# Patient Record
Sex: Female | Born: 1985 | Race: White | Hispanic: No | Marital: Married | Smoking: Current every day smoker
Health system: Southern US, Community
[De-identification: ages and names within clinical notes are randomized; demographics above are authoritative.]

## PROBLEM LIST (undated history)

## (undated) DIAGNOSIS — F419 Anxiety disorder, unspecified: Secondary | ICD-10-CM

## (undated) DIAGNOSIS — F319 Bipolar disorder, unspecified: Secondary | ICD-10-CM

## (undated) HISTORY — PX: TUBAL LIGATION: SHX77

## (undated) HISTORY — PX: APPENDECTOMY: SHX54

## (undated) HISTORY — PX: OTHER SURGICAL HISTORY: SHX169

---
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
## 2006-07-23 ENCOUNTER — Emergency Department: Payer: Self-pay | Admitting: Emergency Medicine

## 2006-12-31 ENCOUNTER — Emergency Department: Payer: Self-pay

## 2007-08-14 ENCOUNTER — Observation Stay: Payer: Self-pay | Admitting: Obstetrics and Gynecology

## 2007-08-19 ENCOUNTER — Inpatient Hospital Stay: Payer: Self-pay | Admitting: Unknown Physician Specialty

## 2009-03-07 ENCOUNTER — Observation Stay: Payer: Self-pay | Admitting: Unknown Physician Specialty

## 2009-03-08 ENCOUNTER — Inpatient Hospital Stay: Payer: Self-pay | Admitting: Obstetrics and Gynecology

## 2010-09-23 ENCOUNTER — Emergency Department: Payer: Self-pay | Admitting: Emergency Medicine

## 2010-09-28 ENCOUNTER — Emergency Department: Payer: Self-pay | Admitting: Emergency Medicine

## 2013-03-12 ENCOUNTER — Observation Stay: Payer: Self-pay

## 2013-03-19 ENCOUNTER — Inpatient Hospital Stay: Payer: Self-pay | Admitting: Obstetrics & Gynecology

## 2013-03-19 LAB — CBC WITH DIFFERENTIAL/PLATELET
Eosinophil #: 0.1 10*3/uL (ref 0.0–0.7)
Eosinophil %: 0.5 %
Lymphocyte #: 2.2 10*3/uL (ref 1.0–3.6)
MCH: 31.5 pg (ref 26.0–34.0)
MCHC: 35.1 g/dL (ref 32.0–36.0)
MCV: 90 fL (ref 80–100)
Monocyte #: 1.7 x10 3/mm — ABNORMAL HIGH (ref 0.2–0.9)
Monocyte %: 7.2 %
Neutrophil %: 82.4 %
WBC: 23.4 10*3/uL — ABNORMAL HIGH (ref 3.6–11.0)

## 2013-03-20 LAB — HEMATOCRIT: HCT: 32.3 % — ABNORMAL LOW (ref 35.0–47.0)

## 2013-10-27 ENCOUNTER — Emergency Department: Payer: Self-pay | Admitting: Emergency Medicine

## 2014-09-10 ENCOUNTER — Emergency Department: Payer: Self-pay | Admitting: Emergency Medicine

## 2014-09-11 LAB — CBC WITH DIFFERENTIAL/PLATELET
BASOS PCT: 0.3 %
Basophil #: 0.1 10*3/uL (ref 0.0–0.1)
Eosinophil #: 0 10*3/uL (ref 0.0–0.7)
Eosinophil %: 0 %
HCT: 47.1 % — ABNORMAL HIGH (ref 35.0–47.0)
HGB: 15.6 g/dL (ref 12.0–16.0)
Lymphocyte #: 1.2 10*3/uL (ref 1.0–3.6)
Lymphocyte %: 5.4 %
MCH: 29.3 pg (ref 26.0–34.0)
MCHC: 33.1 g/dL (ref 32.0–36.0)
MCV: 89 fL (ref 80–100)
Monocyte #: 0.6 x10 3/mm (ref 0.2–0.9)
Monocyte %: 2.9 %
Neutrophil #: 20.3 10*3/uL — ABNORMAL HIGH (ref 1.4–6.5)
Neutrophil %: 91.4 %
Platelet: 389 10*3/uL (ref 150–440)
RBC: 5.3 10*6/uL — ABNORMAL HIGH (ref 3.80–5.20)
RDW: 12.7 % (ref 11.5–14.5)
WBC: 22.2 10*3/uL — AB (ref 3.6–11.0)

## 2014-09-11 LAB — COMPREHENSIVE METABOLIC PANEL
ALBUMIN: 4.2 g/dL (ref 3.4–5.0)
ALK PHOS: 171 U/L — AB
ALT: 104 U/L — AB
Anion Gap: 13 (ref 7–16)
BUN: 12 mg/dL (ref 7–18)
Bilirubin,Total: 1.2 mg/dL — ABNORMAL HIGH (ref 0.2–1.0)
CO2: 21 mmol/L (ref 21–32)
Calcium, Total: 9.4 mg/dL (ref 8.5–10.1)
Chloride: 106 mmol/L (ref 98–107)
Creatinine: 0.83 mg/dL (ref 0.60–1.30)
EGFR (African American): >60
GLUCOSE: 167 mg/dL — AB (ref 65–99)
OSMOLALITY: 283 (ref 275–301)
Potassium: 3.9 mmol/L (ref 3.5–5.1)
SGOT(AST): 46 U/L — ABNORMAL HIGH (ref 15–37)
Sodium: 140 mmol/L (ref 136–145)
Total Protein: 8.4 g/dL — ABNORMAL HIGH (ref 6.4–8.2)

## 2014-09-11 LAB — URINALYSIS, COMPLETE
BILIRUBIN, UR: NEGATIVE
BLOOD: NEGATIVE
Glucose,UR: NEGATIVE mg/dL (ref 0–75)
LEUKOCYTE ESTERASE: NEGATIVE
Nitrite: NEGATIVE
PH: 7 (ref 4.5–8.0)
Protein: 100
RBC,UR: 1 /HPF (ref 0–5)
Specific Gravity: 1.019 (ref 1.003–1.030)
WBC UR: 1 /HPF (ref 0–5)

## 2014-09-11 LAB — LIPASE, BLOOD: Lipase: 73 U/L (ref 73–393)

## 2014-09-12 ENCOUNTER — Inpatient Hospital Stay: Payer: Self-pay | Admitting: Internal Medicine

## 2014-09-12 LAB — CLOSTRIDIUM DIFFICILE(ARMC)

## 2014-09-13 LAB — CBC WITH DIFFERENTIAL/PLATELET
BASOS PCT: 0.3 %
Basophil #: 0 10*3/uL (ref 0.0–0.1)
EOS PCT: 0.6 %
Eosinophil #: 0.1 10*3/uL (ref 0.0–0.7)
HCT: 37.2 % (ref 35.0–47.0)
HGB: 12.2 g/dL (ref 12.0–16.0)
LYMPHS ABS: 2.4 10*3/uL (ref 1.0–3.6)
LYMPHS PCT: 19.5 %
MCH: 29.9 pg (ref 26.0–34.0)
MCHC: 32.7 g/dL (ref 32.0–36.0)
MCV: 91 fL (ref 80–100)
MONO ABS: 1.2 x10 3/mm — AB (ref 0.2–0.9)
MONOS PCT: 9.5 %
NEUTROS PCT: 70.1 %
Neutrophil #: 8.6 10*3/uL — ABNORMAL HIGH (ref 1.4–6.5)
PLATELETS: 266 10*3/uL (ref 150–440)
RBC: 4.07 10*6/uL (ref 3.80–5.20)
RDW: 13 % (ref 11.5–14.5)
WBC: 12.2 10*3/uL — ABNORMAL HIGH (ref 3.6–11.0)

## 2014-09-13 LAB — COMPREHENSIVE METABOLIC PANEL
Albumin: 3.1 g/dL — ABNORMAL LOW (ref 3.4–5.0)
Alkaline Phosphatase: 112 U/L
Anion Gap: 7 (ref 7–16)
BILIRUBIN TOTAL: 0.5 mg/dL (ref 0.2–1.0)
BUN: 9 mg/dL (ref 7–18)
CHLORIDE: 111 mmol/L — AB (ref 98–107)
CREATININE: 0.88 mg/dL (ref 0.60–1.30)
Calcium, Total: 8.2 mg/dL — ABNORMAL LOW (ref 8.5–10.1)
Co2: 25 mmol/L (ref 21–32)
EGFR (African American): >60
EGFR (Non-African Amer.): >60
Glucose: 139 mg/dL — ABNORMAL HIGH (ref 65–99)
Osmolality: 286 (ref 275–301)
Potassium: 4 mmol/L (ref 3.5–5.1)
SGOT(AST): 27 U/L (ref 15–37)
SGPT (ALT): 54 U/L
Sodium: 143 mmol/L (ref 136–145)
Total Protein: 5.9 g/dL — ABNORMAL LOW (ref 6.4–8.2)

## 2014-09-13 LAB — WBCS, STOOL

## 2014-09-16 LAB — STOOL CULTURE

## 2014-09-17 LAB — CULTURE, BLOOD (SINGLE)

## 2015-04-03 NOTE — Discharge Summary (Signed)
PATIENT NAME:  Tricia Kerr, Tricia Kerr MR#:  [id number] DATE OF BIRTH:  09-21-1986  DATE OF ADMISSION:  09/12/2014 DATE OF DISCHARGE:  09/13/2014  PRIMARY CARE PHYSICIAN: Nonlocal.   FINAL DIAGNOSES:  1.  Enteritis, nausea, vomiting, persistent abdominal pain.  2.  Leukocytosis and elevated liver function tests.  3.  Nicotine dependence.   MEDICATIONS ON DISCHARGE: Include Cipro 500 mg every 12 hours for 9 days, metronidazole 250 mg every 8 hours for 9 days.   DIET: Regular diet, stay bland with the diet for the next few days, regular consistency.   ACTIVITY: As tolerated.  FOLLOWUP: One to 2 weeks follow up with your medical doctor.   HOSPITAL COURSE: The patient was admitted 09/12/2014 and discharged 09/13/2014. Came in with abdominal pain, nausea, vomiting, unable to keep anything down. CT scan showed an enteritis. The patient was started on IV fluid hydration and empiric antibiotics.   LABORATORY AND RADIOLOGICAL DATA DURING THE HOSPITAL COURSE: Included a lipase of 73. Glucose 167, BUN 12, creatinine 0.83, sodium 140, potassium 3.9, chloride 106, CO2 of 21, calcium 9.4. Liver function tests: Total bilirubin 1.2, alkaline phosphatase 171, ALT 104, AST 46, total protein 8.4, albumin 4.2. White blood cell count 22.2, hemoglobin 15.6, hematocrit 47.1, platelet count of 387,000. Urinalysis 2+ bilirubin. Ultrasound of the abdomen, no acute abnormalities. CT scan of the abdomen and pelvis showed suggestion thickening of the wall of the ileum, mild mesenteric edema, likely enteritis, patchy ground glass opacities in the lung bases measuring up to 2.2 cm, likely inflammatory. Stool for Clostridium difficile was negative. Blood cultures negative. Stool culture holding for possible pathogen. No pathogenic Escherichia coli, no campylobacter, white blood cells in the stools negative.   White blood cell count upon discharge 12.2, hemoglobin 12.2, hematocrit 37.6, platelet count of 276,000. Creatinine 0.88.  Liver function tests had normalized.   HOSPITAL COURSE PER PROBLEM LIST:  1.  For the enteritis, nausea, vomiting persistent abdominal pain, this had improved. The patient was able to tolerate diet. The patient was given IV fluids during the hospital course and empiric antibiotics. Empiric antibiotics were given upon discharge. Can followup with outpatient for any further issues with her medical doctor.  2.  Leukocytosis and elevated liver function tests, likely secondary from the nausea, vomiting and being sick. This had improved.  3.  Nicotine dependence. I TRIED GIVING A NICOTINE PATCH. THE PATIENT HAD A REACTION WITH THAT, DEVELOPED SHORTNESS OF BREATH AND VERY SHAKINESS AND NAUSEOUS. Felt better after the nicotine patch was removed. She will go cold [place] on stopping this.  TIME SPENT ON DISCHARGE: Thirty-five minutes.    ____________________________ Herschell Dimesichard J. Renae GlossWieting, MD rjw:TT D: 09/14/2014 14:31:09 ET T: 09/14/2014 16:30:37 ET JOB#: [id number]  cc: Herschell Dimesichard J. Renae GlossWieting, MD, <Dictator> Salley ScarletICHARD J Ahren Pettinger MD ELECTRONICALLY SIGNED 09/27/2014 12:47

## 2015-04-03 NOTE — H&P (Signed)
PATIENT NAME:  KALIKA, SMAY MR#:  [id number] DATE OF BIRTH:  1986/04/26  DATE OF ADMISSION:  09/12/2014  REFERRING PHYSICIAN:  Rebecka Apley, MD     ADMITTING PHYSICIAN:  Crissie Figures, MD    PRIMARY CARE PHYSICIAN:  Nonlocal.    CHIEF COMPLAINT:  Nausea, vomiting and upper abdominal pain.    HISTORY OF PRESENT ILLNESS:  A 29 year old pleasant Caucasian woman with no significant past medical history presents to the emergency room with complaints of nausea, vomiting and upper abdominal pain following taking Zithromax tablets on an empty stomach around 1 p.m. yesterday.  The patient states that she has some URI symptoms for which she came to the Emergency Room on October 2, was evaluated by the provider and was diagnosed to have some sinus infection and was sent home on oral Zithromax tablets.  The patient took Zithromax tablets around 1 p.m. yesterday on an empty stomach following which she immediately had an episode of nausea, vomiting associated with upper abdominal pain, hence came to the Emergency Room for further evaluation.  Denies any fever.  Denies any blood in the vomitus.  Denies any rectal bleeding or melena.  She continued to have vomiting multiple times, even the emesis episodes lasted while she was in the Emergency Room and she was given some antiemetic medications following which the nausea and vomiting subsided.  Currently she does have some mild nausea but otherwise no further vomiting and abdominal pain is under reasonable control with pain medications.  She does have some mild cough for the past 3 days.  Otherwise, denies any chest pain, shortness of breath, dizziness, general weakness.  No dysuria or hematuria.    In the Emergency Room patient was evaluated by the ED physician and her lab work came as elevated white blood cell count of 22,000 with significant neutrophil elevations and a CT of the abdomen and pelvis revealed thickening of the wall of the ileum with mild  mesenteric edema representing enteritis, and she received IV antibiotics 1 dose, Cipro and Flagyl IV antibiotics, and hospitalist service consulted for further management.    PAST MEDICAL HISTORY:  Nil significant.    PAST SURGICAL HISTORY:  Status post appendectomy.    HOME MEDICATIONS:  None.    ALLERGIES:  No known drug allergies.     SOCIAL HISTORY:  She is married, has 3 children.  History of smoking, currently smokes half pack per day for the past 15+ years.  Occasional alcohol intake very socially.  Denies any IV drug abuse.    FAMILY HISTORY:  Lurline Del has a history of diabetes.  Otherwise, nil significant.    GYNECOLOGIC HISTORY:  Last menstrual period 1 week ago and uses condoms for her contraception.    REVIEW OF SYSTEMS:   CONSTITUTIONAL:  Denies any fever or fatigue, generalized weakness, no history of abnormal weight gain or weight loss.  EYES:  No blurred vision or double vision.  No redness.  EARS, NOSE THROAT:  Negative for tinnitus, ear pain, discharge. Negative for epistaxis. No oropharyngeal redness and no difficulty in swallowing.   RESPIRATORY:  Mild cough for the past 3 days but denies wheezing, no hemoptysis, no dyspnea, no painful respirations.  CARDIOVASCULAR:  Negative for chest pain, shortness of breath, dyspnea on exertion, orthopnea, palpitations.  No syncope.  No pedal edema.   GASTROINTESTINAL:  Positive for nausea, vomiting and abdominal pain following taking Zithromax tablets on an empty stomach.  She continued to have some vomiting episodes even  through the ER but now resolved.  Mild nausea at this time.  No diarrhea.  No blood in vomitus.  No melena.  No rectal bleeding.  No GERD symptoms.  GENITOURINARY:  Negative for dysuria, hematuria, frequency.  ENDOCRINE:  Negative for polyuria, polydipsia.  No heat or cold intolerance.   HEMATOLOGIC:  Negative for easy bruising or bleeding.   SKIN:  Negative for any rashes and lesions.  MUSCULOSKELETAL:  No  arthritis. No swelling.   NEUROLOGICAL:  Negative for focal weakness or numbness.  No migraines.  No history of CVA, TIA or seizures.    PSYCHIATRIC:  Negative for any anxiety, insomnia or depression.   PHYSICAL EXAMINATION:  VITAL SIGNS:  Temperature 97.9, pulse rate 76, respirations 18, blood pressure 112/69, O2 saturation 97%.  GENERAL:  Well built, well nourished, alert.  Not in any acute distress.  HEENT:  Head: Atraumatic, normocephalic.  Eyes:  Pupils equal and reacting to light and accommodation. No conjunctival pallor, no scleral icterus.  Extraocular movements intact. Nose:  No nasal lesions. No drainage.  Ears:  No drainage, no external lesions. Mouth:  No mucosal lesions.  No exudates.   NECK:  Supple. No JVD. No thyromegaly. No masses.  No cervical lymphadenopathy. Full range of motions without any pain. RESPIRATORY:  Good respiratory effort.  Clear to auscultation bilaterally.  CARDIOVASCULAR:  Regular rate and rhythm, no murmurs, gallops or clicks.  Carotids equal, femoral and pedal pulses equal and normal. No peripheral edema.  GASTROINTESTINAL:  Generalized mild tenderness in the upper abdomen. No guarding.  No rigidity.  No hepatosplenomegaly.  Bowel sounds equal in 4 areas.  Abdomen not distended. No rebound.  GENITOURINARY:  Deferred.  MUSCULOSKELETAL:  No tenderness of any joints.  Range of motion adequate.  SKIN:  Inspection within normal limits.  Well hydrated.  LYMPHATIC:  No cervical adenopathy.  VASCULAR:  Good dorsalis pedis and posterior tibial pulses.  NEUROLOGICAL:  Alert, awake and oriented x 3.  Cranial nerves II-XII intact.  DTRs 2+ bilaterally.  Motor strength 4/4 bilaterally upper and lower extremities.  PSYCHIATRIC:  Judgment and insight adequate. Alert and oriented x 3. Memory and mood within normal limits.   LABORATORY DATA:  Serum glucose 167, BUN 12, creatinine 0.83, sodium 140, potassium 3.9, chloride 106, bicarb 21, estimated GFR more than 60, total serum  calcium 9.4, lipase 73, total protein 8.4, albumin 4.2, total bilirubin 1.2, alkaline phosphatase 171, AST 46, ALT 104.  CBC:  WBC 22.2, hemoglobin 15.6, hematocrit 47.1, platelets 389, MCV 89, neutrophils 91.4%, neutrophil count 20.3.  Urinalysis:   Protein 100 mg/dL, nitrites and leukocyte esterase negative.  No RBC.  No WBC.    CT of the abdomen and pelvis with contrast:   1.  Thickening of the wall of the ileum with mild mesenteric edema likely representing enteritis.   2.  Patchy ground-glass opacities in the lung bases measuring 2.2 cm.  This is likely inflammatory.  Consider 3 month follow up to look for resolution.    Ultrasound of abdomen:  No gallstones or wall thickening.  No sonographic Murphy sign.  Common bile duct diameter normal.  No focal lesions.  Within normal limits.  No acute abnormalities demonstrated.    ASSESSMENT AND PLAN:  A 29 year old white female without any significant past medical history presents to the Emergency Room with complaints of nausea, vomiting and abdominal pain following taking Zithromax tablets on an empty stomach.  Workup and evaluation revealed elevated white blood cell count and  a CT of the abdomen revealed enteritis.   1.  Nausea, vomiting and abdominal pain secondary due to enteritis.  White count elevated at 22,000 and CT of the abdomen showing inflammation of the ileus.  Plan:  Blood cultures obtained.  Start on IV antibiotics, Cipro and metronidazole, antiemetics for nausea and vomiting control, pain control medications.  Follow up CBC, blood cultures.   2.  Elevated liver function tests.  Ultrasound done in the Emergency Room normal.  Etiology of elevated LFTs unclear at this time.  We will monitor LFTs closely.   3.  Tobacco usage, continuous.  Plan:  Counsel to quit.   4.  Code status.  Full status.    TIME SPENT:  55 minutes.    ____________________________ Crissie FiguresEdavally N. Meko Bellanger, MD enr:AT D: 09/12/2014 05:38:22 ET T: 09/12/2014 06:19:15  ET JOB#: [id number]  cc: Crissie FiguresEdavally N. Addalie Calles, MD, <Dictator> Crissie FiguresEDAVALLY N Librada Castronovo MD ELECTRONICALLY SIGNED 09/12/2014 10:37

## 2015-04-20 NOTE — H&P (Signed)
L&D Evaluation:  History:  HPI [age] year old G3 p2002 with EDC=03/15/2013 by LMP=06/08/2013 and c/b a 8wk 3 day ultrasoud presents at 39 5/7 weeks with c/o contractions and LOF. Seen in the office today and membranes were stripped. Contractions started after that and intensified at 1730 this evening. Also noted a sometime mucoid, sometimes watery discharge after exam. Baby active. Was 2.5 cm dilated in office today. [facility], a negative first trimester screen and a normal anatomy scan. Is O negative and received Rhogam 12/21/12. RI, VI, GBS negative. OB HX: SVD x 2. IOL in 2008 at 37 weeks for preeclampsia delivering a 5-15 female. SVD in 2010 at 40 weeks delivering a 6-6 female.   Presents with contractions, leaking fluid   Patient's Medical History Preeclampsia with G1 and G2   Patient's Surgical History Appendectomy   Medications Pre Natal Vitamins   Allergies NKDA   Social History tobacco  Former smoker   Family History Non-Contributory   ROS:  ROS All systems were reviewed.  HEENT, CNS, GI, GU, Respiratory, CV, Renal and Musculoskeletal systems were found to be normal.   Exam:  Vital Signs stable  134/85 T=98.6   Urine Protein not completed   General no apparent distress, appears comfortable, talking thru contractions.   Mental Status clear   Abdomen gravid, non-tender   Estimated Fetal Weight Average for gestational age   Fetal Position cephalic on ultrasound   Pelvic no external lesions, SSE: scant white discharge. fern and Nitrazine negative. cx: 2.5/80%/OOP   Mebranes Intact   FHT normal rate with no decels, 135-140 with accels to 170   FHT Description Cat 1   Fetal Heart Rate 140   Ucx q3-8 min apart   Skin dry   Impression:  Impression IUP at 39 5/7 weeks with false labor   Plan:  Plan DC home with labor precautions   Electronic Signatures: Trinna BalloonGutierrez, Naman Spychalski L (CNM)  (Signed 03-Apr-14 05:51)  Authored: L&D  Evaluation   Last Updated: 03-Apr-14 05:51 by Trinna BalloonGutierrez, Sevan Mcbroom L (CNM)

## 2015-04-20 NOTE — H&P (Signed)
L&D Evaluation:  History:  HPI 29 year old 163P2002 with EDC=03/15/2013 by LMP=06/08/2013 and c/b a 8wk 3 day ultrasoud presents at 566w3d with c/o contractions Baby active. Last check 2.5 cm < 1 week ago.   [facility], a negative first trimester screen and a normal anatomy scan. Is O negative and received Rhogam 12/21/12. RI, VI, GBS negative. OB HX: SVD x 2. IOL in 2008 at 37 weeks for preeclampsia delivering a 5-15 female. SVD in 2010 at 40 weeks delivering a 6-6 female.   Presents with contractions   Patient's Medical History Preeclampsia with G1 and G2   Patient's Surgical History Appendectomy   Medications Pre Natal Vitamins   Allergies NKDA   Social History tobacco  Former smoker   Family History Non-Contributory   ROS:  ROS All systems were reviewed.  HEENT, CNS, GI, GU, Respiratory, CV, Renal and Musculoskeletal systems were found to be normal.   Exam:  Vital Signs stable  normotensive   Urine Protein not completed   General no apparent distress, mild distress with contractions   Mental Status clear   Chest nl effort   Abdomen gravid, non-tender   Estimated Fetal Weight Average for gestational age   Pelvic 3-4cm / 90% per RN   Mebranes Intact   FHT normal rate with no decels, reactive NST   FHT Description Cat 1   Ucx every 3-5 min   Skin dry   Impression:  Impression G3P2 at 2266w3d in latent labor, IOL scheduled in 2 days.   Plan:  Plan EFM/NST, monitor contractions and for cervical change   Comments - Given pt discomfort and cervical change since last exam and IOL scheduled in very near future will admit for labor.   - analgesia as desired   Electronic Signatures: Garnette GunnerStansbury Clipp, Ali LoweEryn K (MD)  (Signed 09-Apr-14 07:45)  Authored: L&D Evaluation   Last Updated: 09-Apr-14 07:45 by Garnette GunnerStansbury Clipp, Ali LoweEryn K (MD)

## 2015-05-07 ENCOUNTER — Encounter: Payer: Self-pay | Admitting: Emergency Medicine

## 2015-05-07 ENCOUNTER — Ambulatory Visit
Admission: EM | Admit: 2015-05-07 | Discharge: 2015-05-07 | Disposition: A | Discharge status: Home / Self Care | Payer: Self-pay | Attending: Family Medicine | Admitting: Family Medicine

## 2015-05-07 DIAGNOSIS — R21 Rash and other nonspecific skin eruption: Secondary | ICD-10-CM

## 2015-05-07 NOTE — ED Notes (Signed)
Patient states that yesterday she fell off a deer stand and landed in a briar patch.  Patient has several red bumps all over her arms and legs.  Patient denies itching.  Patient reports some burning at the sites.

## 2015-05-07 NOTE — Discharge Instructions (Signed)
Rash A rash is a change in the color or texture of your skin. There are many different types of rashes. You may have other problems that accompany your rash. CAUSES   Infections.  Allergic reactions. This can include allergies to pets or foods.  Certain medicines.  Exposure to certain chemicals, soaps, or cosmetics.  Heat.  Exposure to poisonous plants.  Tumors, both cancerous and noncancerous. SYMPTOMS   Redness.  Scaly skin.  Itchy skin.  Dry or cracked skin.  Bumps.  Blisters.  Pain. DIAGNOSIS  Your caregiver may do a physical exam to determine what type of rash you have. A skin sample (biopsy) may be taken and examined under a microscope. TREATMENT  Treatment depends on the type of rash you have. Your caregiver may prescribe certain medicines. For serious conditions, you may need to see a skin doctor (dermatologist). HOME CARE INSTRUCTIONS   Avoid the substance that caused your rash.  Do not scratch your rash. This can cause infection.  You may take cool baths to help stop itching.  Only take over-the-counter or prescription medicines as directed by your caregiver.  Keep all follow-up appointments as directed by your caregiver. SEEK IMMEDIATE MEDICAL CARE IF:  You have increasing pain, swelling, or redness.  You have a fever.  You have new or severe symptoms.  You have body aches, diarrhea, or vomiting.  Your rash is not better after 3 days. MAKE SURE YOU:  Understand these instructions.  Will watch your condition.  Will get help right away if you are not doing well or get worse. Document Released: 11/17/2002 Document Revised: 02/19/2012 Document Reviewed: 09/11/2011 [facility] Patient Information 2015 [place], [place]. This information is not intended to replace advice given to you by your health care provider. Make sure you discuss any questions you have with your health care provider.  Tick Bite Information Ticks are insects that attach  themselves to the skin and draw blood for food. There are various types of ticks. Common types include wood ticks and deer ticks. Most ticks live in shrubs and grassy areas. Ticks can climb onto your body when you make contact with leaves or grass where the tick is waiting. The most common places on the body for ticks to attach themselves are the scalp, neck, armpits, waist, and groin. Most tick bites are harmless, but sometimes ticks carry germs that cause diseases. These germs can be spread to a person during the tick's feeding process. The chance of a disease spreading through a tick bite depends on:   The type of tick.  Time of year.   How long the tick is attached.   Geographic location.  HOW CAN YOU PREVENT TICK BITES? Take these steps to help prevent tick bites when you are outdoors:  Wear protective clothing. Long sleeves and long pants are best.   Wear white clothes so you can see ticks more easily.  Tuck your pant legs into your socks.   If walking on a trail, stay in the middle of the trail to avoid brushing against bushes.  Avoid walking through areas with long grass.  Put insect repellent on all exposed skin and along boot tops, pant legs, and sleeve cuffs.   Check clothing, hair, and skin repeatedly and before going inside.   Brush off any ticks that are not attached.  Take a shower or bath as soon as possible after being outdoors.  WHAT IS THE PROPER WAY TO REMOVE A TICK? Ticks should be removed as soon  as possible to help prevent diseases caused by tick bites.  If latex gloves are available, put them on before trying to remove a tick.   Using fine-point tweezers, grasp the tick as close to the skin as possible. You may also use curved forceps or a tick removal tool. Grasp the tick as close to its head as possible. Avoid grasping the tick on its body.  Pull gently with steady upward pressure until the tick lets go. Do not twist the tick or jerk it  suddenly. This may break off the tick's head or mouth parts.  Do not squeeze or crush the tick's body. This could force disease-carrying fluids from the tick into your body.   After the tick is removed, wash the bite area and your hands with soap and water or other disinfectant such as alcohol.  Apply a small amount of antiseptic cream or ointment to the bite site.   Wash and disinfect any instruments that were used.  Do not try to remove a tick by applying a hot match, petroleum jelly, or fingernail polish to the tick. These methods do not work and may increase the chances of disease being spread from the tick bite.  WHEN SHOULD YOU SEEK MEDICAL CARE? Contact your health care provider if you are unable to remove a tick from your skin or if a part of the tick breaks off and is stuck in the skin.  After a tick bite, you need to be aware of signs and symptoms that could be related to diseases spread by ticks. Contact your health care provider if you develop any of the following in the days or weeks after the tick bite:  Unexplained fever.  Rash. A circular rash that appears days or weeks after the tick bite may indicate the possibility of Lyme disease. The rash may resemble a target with a bull's-eye and may occur at a different part of your body than the tick bite.  Redness and swelling in the area of the tick bite.   Tender, swollen lymph glands.   Diarrhea.   Weight loss.   Cough.   Fatigue.   Muscle, joint, or bone pain.   Abdominal pain.   Headache.   Lethargy or a change in your level of consciousness.  Difficulty walking or moving your legs.   Numbness in the legs.   Paralysis.  Shortness of breath.   Confusion.   Repeated vomiting.  Document Released: 11/24/2000 Document Revised: 09/17/2013 Document Reviewed: 05/07/2013 [facility] Patient Information 2015 [place], [place]. This information is not intended to replace advice given to you by your  health care provider. Make sure you discuss any questions you have with your health care provider.  Insect Bite Mosquitoes, flies, fleas, bedbugs, and many other insects can bite. Insect bites are different from insect stings. A sting is when venom is injected into the skin. Some insect bites can transmit infectious diseases. SYMPTOMS  Insect bites usually turn red, swell, and itch for 2 to 4 days. They often go away on their own. TREATMENT  Your caregiver may prescribe antibiotic medicines if a bacterial infection develops in the bite. HOME CARE INSTRUCTIONS  Do not scratch the bite area.  Keep the bite area clean and dry. Wash the bite area thoroughly with soap and water.  Put ice or cool compresses on the bite area.  Put ice in a plastic bag.  Place a towel between your skin and the bag.  Leave the ice on for 20  minutes, 4 times a day for the first 2 to 3 days, or as directed.  You may apply a baking soda paste, cortisone cream, or calamine lotion to the bite area as directed by your caregiver. This can help reduce itching and swelling.  Only take over-the-counter or prescription medicines as directed by your caregiver.  If you are given antibiotics, take them as directed. Finish them even if you start to feel better. You may need a tetanus shot if:  You cannot remember when you had your last tetanus shot.  You have never had a tetanus shot.  The injury broke your skin. If you get a tetanus shot, your arm may swell, get red, and feel warm to the touch. This is common and not a problem. If you need a tetanus shot and you choose not to have one, there is a rare chance of getting tetanus. Sickness from tetanus can be serious. SEEK IMMEDIATE MEDICAL CARE IF:   You have increased pain, redness, or swelling in the bite area.  You see a red line on the skin coming from the bite.  You have a fever.  You have joint pain.  You have a headache or neck pain.  You have unusual  weakness.  You have a rash.  You have chest pain or shortness of breath.  You have abdominal pain, nausea, or vomiting.  You feel unusually tired or sleepy. MAKE SURE YOU:   Understand these instructions.  Will watch your condition.  Will get help right away if you are not doing well or get worse. Document Released: 01/04/2005 Document Revised: 02/19/2012 Document Reviewed: 06/28/2011 [facility] Patient Information 2015 [place], [place]. This information is not intended to replace advice given to you by your health care provider. Make sure you discuss any questions you have with your health care provider.

## 2015-05-07 NOTE — ED Provider Notes (Signed)
CSN: [id number]     Arrival date & time 05/07/15  1313 History   First MD Initiated Contact with Patient 05/07/15 1428     Chief Complaint  Patient presents with  . Fall  . red bumps    (Consider location/radiation/quality/duration/timing/severity/associated sxs/prior Treatment) HPI   29 year old female presents for evaluation of a rash. Yesterday she fell out of a deer stand into a briar patch. She has multiple scrapes and she woke up this morning with the rash. She has multiple areas on her body with a small scab and some surrounding induration. She thought that possibly she was bitten by multiple ticks but they have not seen any ticks on her body. They tried to remove any potential ticks from the scabbed over area but was unable to do so. The rash is asymptomatic. She denies any pain, itching, or drainage. No fever, chills, NVD, or any other systemic symptoms   History reviewed. No pertinent past medical history. Past Surgical History  Procedure Laterality Date  . Appendectomy     History reviewed. No pertinent family history. History  Substance Use Topics  . Smoking status: Current Every Day Smoker -- 1.00 packs/day    Types: Cigarettes  . Smokeless tobacco: Never Used  . Alcohol Use: Yes   OB History    No data available     Review of Systems  Skin: Positive for rash.  All other systems reviewed and are negative.   Allergies  Nicotine  Home Medications   Prior to Admission medications   Not on File   BP 130/81 mmHg  Pulse 71  Temp(Src) 97 F (36.1 C) (Tympanic)  Resp 16  Ht 4\' 11"  (1.499 m)  Wt 116 lb (52.617 kg)  BMI 23.42 kg/m2  SpO2 97%  LMP 04/29/2015 (Exact Date) Physical Exam  Constitutional: She is oriented to person, place, and time. Vital signs are normal. She appears well-developed and well-nourished. No distress.  HENT:  Head: Normocephalic and atraumatic.  Pulmonary/Chest: Effort normal. No respiratory distress.  Neurological: She is alert and  oriented to person, place, and time. She has normal strength. Coordination normal.  Skin: Skin is warm and dry. Rash (on exposed areas of the skin, there are multiple 2-3 mm scabbed areas with very minimal surrounding induration, nontender.) noted. She is not diaphoretic.  Multiple abrasions  Psychiatric: She has a normal mood and affect. Judgment normal.  Nursing note and vitals reviewed.   ED Course  Procedures (including critical care time) Labs Review Labs Reviewed - No data to display  Imaging Review No results found.   MDM   1. Rash    I do not see any ticks. I believe this is most likely scabs from puncture wounds from the briars. Either way, this is much too early to determine if she would develop any tickborne illness. She has no signs of any skin infection either. I discussed this with her, she elects watchful waiting and she will return if she has any worsening in her condition. We discussed signs and symptoms of tick born illness, she will follow-up immediately if she gets sick at all in the next month    Graylon GoodZachary H Merrianne Mccumbers, PA-C 05/07/15 1447

## 2015-06-23 ENCOUNTER — Encounter: Payer: Self-pay | Admitting: Emergency Medicine

## 2015-06-23 ENCOUNTER — Emergency Department
Admission: EM | Admit: 2015-06-23 | Discharge: 2015-06-23 | Disposition: A | Discharge status: Home / Self Care | Payer: Medicaid Other | Attending: Emergency Medicine | Admitting: Emergency Medicine

## 2015-06-23 DIAGNOSIS — K529 Noninfective gastroenteritis and colitis, unspecified: Secondary | ICD-10-CM | POA: Insufficient documentation

## 2015-06-23 DIAGNOSIS — Z79899 Other long term (current) drug therapy: Secondary | ICD-10-CM | POA: Diagnosis not present

## 2015-06-23 DIAGNOSIS — E860 Dehydration: Secondary | ICD-10-CM | POA: Diagnosis not present

## 2015-06-23 DIAGNOSIS — R197 Diarrhea, unspecified: Secondary | ICD-10-CM | POA: Diagnosis present

## 2015-06-23 DIAGNOSIS — Z720 Tobacco use: Secondary | ICD-10-CM | POA: Diagnosis not present

## 2015-06-23 LAB — URINALYSIS COMPLETE WITH MICROSCOPIC (ARMC ONLY)
BACTERIA UA: NONE SEEN
Bilirubin Urine: NEGATIVE
Glucose, UA: NEGATIVE mg/dL
HGB URINE DIPSTICK: NEGATIVE
LEUKOCYTES UA: NEGATIVE
Nitrite: NEGATIVE
PROTEIN: 100 mg/dL — AB
SPECIFIC GRAVITY, URINE: 1.028 (ref 1.005–1.030)
pH: 6 (ref 5.0–8.0)

## 2015-06-23 LAB — COMPREHENSIVE METABOLIC PANEL
ALT: 22 U/L (ref 14–54)
AST: 28 U/L (ref 15–41)
Albumin: 5 g/dL (ref 3.5–5.0)
Alkaline Phosphatase: 81 U/L (ref 38–126)
Anion gap: 12 (ref 5–15)
BUN: 12 mg/dL (ref 6–20)
CO2: 21 mmol/L — ABNORMAL LOW (ref 22–32)
Calcium: 9.6 mg/dL (ref 8.9–10.3)
Chloride: 106 mmol/L (ref 101–111)
Creatinine, Ser: 0.82 mg/dL (ref 0.44–1.00)
GFR calc Af Amer: >60 mL/min (ref >60)
GFR calc non Af Amer: >60 mL/min (ref >60)
Glucose, Bld: 161 mg/dL — ABNORMAL HIGH (ref 65–99)
Potassium: 3.7 mmol/L (ref 3.5–5.1)
SODIUM: 139 mmol/L (ref 135–145)
Total Bilirubin: 1.5 mg/dL — ABNORMAL HIGH (ref 0.3–1.2)
Total Protein: 8.1 g/dL (ref 6.5–8.1)

## 2015-06-23 LAB — CBC
HCT: 46.8 % (ref 35.0–47.0)
HEMOGLOBIN: 16 g/dL (ref 12.0–16.0)
MCH: 30.3 pg (ref 26.0–34.0)
MCHC: 34.3 g/dL (ref 32.0–36.0)
MCV: 88.3 fL (ref 80.0–100.0)
PLATELETS: 335 10*3/uL (ref 150–440)
RBC: 5.3 MIL/uL — AB (ref 3.80–5.20)
RDW: 13 % (ref 11.5–14.5)
WBC: 18.8 10*3/uL — ABNORMAL HIGH (ref 3.6–11.0)

## 2015-06-23 LAB — LIPASE, BLOOD: Lipase: 49 U/L (ref 22–51)

## 2015-06-23 MED ORDER — MORPHINE SULFATE 4 MG/ML IJ SOLN
INTRAMUSCULAR | Status: AC
  Administered 2015-06-23: 4 mg via INTRAVENOUS
  Filled 2015-06-23: qty 1

## 2015-06-23 MED ORDER — ONDANSETRON 8 MG PO TBDP: 8.0000 mg | ORAL_TABLET | Freq: Three times a day (TID) | ORAL | Status: DC | PRN

## 2015-06-23 MED ORDER — DICYCLOMINE HCL 10 MG PO CAPS
ORAL_CAPSULE | ORAL | Status: AC
  Administered 2015-06-23: 20 mg via ORAL
  Filled 2015-06-23: qty 2

## 2015-06-23 MED ORDER — RANITIDINE HCL 150 MG PO CAPS: 150.0000 mg | ORAL_CAPSULE | Freq: Two times a day (BID) | ORAL | Status: DC

## 2015-06-23 MED ORDER — DEXTROSE 5 % AND 0.9 % NACL IV BOLUS
1000.0000 mL | Freq: Once | INTRAVENOUS | Status: AC
  Administered 2015-06-23: 1000 mL via INTRAVENOUS

## 2015-06-23 MED ORDER — MORPHINE SULFATE 4 MG/ML IJ SOLN
4.0000 mg | Freq: Once | INTRAMUSCULAR | Status: AC
  Administered 2015-06-23: 4 mg via INTRAVENOUS

## 2015-06-23 MED ORDER — DICYCLOMINE HCL 10 MG PO CAPS
20.0000 mg | ORAL_CAPSULE | Freq: Once | ORAL | Status: AC
  Administered 2015-06-23: 20 mg via ORAL

## 2015-06-23 MED ORDER — DICYCLOMINE HCL 20 MG PO TABS: 20.0000 mg | ORAL_TABLET | Freq: Three times a day (TID) | ORAL | Status: DC | PRN

## 2015-06-23 MED ORDER — ONDANSETRON HCL 4 MG/2ML IJ SOLN
INTRAMUSCULAR | Status: AC
  Administered 2015-06-23: 4 mg via INTRAVENOUS
  Filled 2015-06-23: qty 2

## 2015-06-23 MED ORDER — ONDANSETRON HCL 4 MG/2ML IJ SOLN
4.0000 mg | Freq: Once | INTRAMUSCULAR | Status: AC
  Administered 2015-06-23: 4 mg via INTRAVENOUS

## 2015-06-23 NOTE — ED Notes (Signed)
Pt presents with  N/v/d that started last night with epigastric pain.

## 2015-06-23 NOTE — Discharge Instructions (Signed)
Dehydration, Adult Dehydration is when you lose more fluids from the body than you take in. Vital organs like the kidneys, brain, and heart cannot function without a proper amount of fluids and salt. Any loss of fluids from the body can cause dehydration.  CAUSES   Vomiting.  Diarrhea.  Excessive sweating.  Excessive urine output.  Fever. SYMPTOMS  Mild dehydration  Thirst.  Dry lips.  Slightly dry mouth. Moderate dehydration  Very dry mouth.  Sunken eyes.  Skin does not bounce back quickly when lightly pinched and released.  Dark urine and decreased urine production.  Decreased tear production.  Headache. Severe dehydration  Very dry mouth.  Extreme thirst.  Rapid, weak pulse (more than 100 beats per minute at rest).  Cold hands and feet.  Not able to sweat in spite of heat and temperature.  Rapid breathing.  Blue lips.  Confusion and lethargy.  Difficulty being awakened.  Minimal urine production.  No tears. DIAGNOSIS  Your caregiver will diagnose dehydration based on your symptoms and your exam. Blood and urine tests will help confirm the diagnosis. The diagnostic evaluation should also identify the cause of dehydration. TREATMENT  Treatment of mild or moderate dehydration can often be done at home by increasing the amount of fluids that you drink. It is best to drink small amounts of fluid more often. Drinking too much at one time can make vomiting worse. Refer to the home care instructions below. Severe dehydration needs to be treated at the hospital where you will probably be given intravenous (IV) fluids that contain water and electrolytes. HOME CARE INSTRUCTIONS   Ask your caregiver about specific rehydration instructions.  Drink enough fluids to keep your urine clear or pale yellow.  Drink small amounts frequently if you have nausea and vomiting.  Eat as you normally do.  Avoid:  Foods or drinks high in sugar.  Carbonated  drinks.  Juice.  Extremely hot or cold fluids.  Drinks with caffeine.  Fatty, greasy foods.  Alcohol.  Tobacco.  Overeating.  Gelatin desserts.  Wash your hands well to avoid spreading bacteria and viruses.  Only take over-the-counter or prescription medicines for pain, discomfort, or fever as directed by your caregiver.  Ask your caregiver if you should continue all prescribed and over-the-counter medicines.  Keep all follow-up appointments with your caregiver. SEEK MEDICAL CARE IF:  You have abdominal pain and it increases or stays in one area (localizes).  You have a rash, stiff neck, or severe headache.  You are irritable, sleepy, or difficult to awaken.  You are weak, dizzy, or extremely thirsty. SEEK IMMEDIATE MEDICAL CARE IF:   You are unable to keep fluids down or you get worse despite treatment.  You have frequent episodes of vomiting or diarrhea.  You have blood or green matter (bile) in your vomit.  You have blood in your stool or your stool looks black and tarry.  You have not urinated in 6 to 8 hours, or you have only urinated a small amount of very dark urine.  You have a fever.  You faint. MAKE SURE YOU:   Understand these instructions.  Will watch your condition.  Will get help right away if you are not doing well or get worse. Document Released: 11/27/2005 Document Revised: 02/19/2012 Document Reviewed: 07/17/2011 Exit[employer] Patient Information 2015 [employer]. This information is not intended to replace advice given to you by your health care provider. Make sure you discuss any questions you have with your health care   provider.   Viral Gastroenteritis Viral gastroenteritis is also known as stomach flu. This condition affects the stomach and intestinal tract. It can cause sudden diarrhea and vomiting. The illness typically lasts 3 to 8 days. Most people develop an immune response that eventually gets rid of the virus. While this natural  response develops, the virus can make you quite ill. CAUSES  Many different viruses can cause gastroenteritis, such as rotavirus or noroviruses. You can catch one of these viruses by consuming contaminated food or water. You may also catch a virus by sharing utensils or other personal items with an infected person or by touching a contaminated surface. SYMPTOMS  The most common symptoms are diarrhea and vomiting. These problems can cause a severe loss of body fluids (dehydration) and a body salt (electrolyte) imbalance. Other symptoms may include:  Fever.  Headache.  Fatigue.  Abdominal pain. DIAGNOSIS  Your caregiver can usually diagnose viral gastroenteritis based on your symptoms and a physical exam. A stool sample may also be taken to test for the presence of viruses or other infections. TREATMENT  This illness typically goes away on its own. Treatments are aimed at rehydration. The most serious cases of viral gastroenteritis involve vomiting so severely that you are not able to keep fluids down. In these cases, fluids must be given through an intravenous line (IV). HOME CARE INSTRUCTIONS   Drink enough fluids to keep your urine clear or pale yellow. Drink small amounts of fluids frequently and increase the amounts as tolerated.  Ask your caregiver for specific rehydration instructions.  Avoid:  Foods high in sugar.  Alcohol.  Carbonated drinks.  Tobacco.  Juice.  Caffeine drinks.  Extremely hot or cold fluids.  Fatty, greasy foods.  Too much intake of anything at one time.  Dairy products until 24 to 48 hours after diarrhea stops.  You may consume probiotics. Probiotics are active cultures of beneficial bacteria. They may lessen the amount and number of diarrheal stools in adults. Probiotics can be found in yogurt with active cultures and in supplements.  Wash your hands well to avoid spreading the virus.  Only take over-the-counter or prescription medicines for  pain, discomfort, or fever as directed by your caregiver. Do not give aspirin to children. Antidiarrheal medicines are not recommended.  Ask your caregiver if you should continue to take your regular prescribed and over-the-counter medicines.  Keep all follow-up appointments as directed by your caregiver. SEEK IMMEDIATE MEDICAL CARE IF:   You are unable to keep fluids down.  You do not urinate at least once every 6 to 8 hours.  You develop shortness of breath.  You notice blood in your stool or vomit. This may look like coffee grounds.  You have abdominal pain that increases or is concentrated in one small area (localized).  You have persistent vomiting or diarrhea.  You have a fever.  The patient is a child younger than 3 months, and he or she has a fever.  The patient is a child older than 3 months, and he or she has a fever and persistent symptoms.  The patient is a child older than 3 months, and he or she has a fever and symptoms suddenly get worse.  The patient is a baby, and he or she has no tears when crying. MAKE SURE YOU:   Understand these instructions.  Will watch your condition.  Will get help right away if you are not doing well or get worse. Document Released: 11/27/2005 Document Revised:   02/19/2012 Document Reviewed: 09/13/2011 ExitCare Patient Information 2015 [employer]. This information is not intended to replace advice given to you by your health care provider. Make sure you discuss any questions you have with your health care provider.  

## 2015-06-23 NOTE — ED Provider Notes (Signed)
[facility] Emergency Department Provider Note  ____________________________________________  Time seen: 3:15 PM  I have reviewed the triage vital signs and the nursing notes.   HISTORY  Chief Complaint Emesis and Diarrhea    HPI Tricia Kerr is a 29 y.o. female who complains of gradual onset of generalized abdominal pain with nausea vomiting and diarrhea since last night. She's had persistent episodes of vomiting and diarrhea, which are provoked with eating. She does not have postprandial pain. No fevers or chills. No chest pain shortness of breath dizziness lightheadedness or syncope. No urinary symptoms. She notes that the symptoms started last night after eating some barbecue from a cookout. Abdominal pain is nonradiating, no aggravating or alleviating factors, intermittent and colicky.     History reviewed. No pertinent past medical history.  There are no active problems to display for this patient.   Past Surgical History  Procedure Laterality Date  . Appendectomy      Current Outpatient Rx  Name  Route  Sig  Dispense  Refill  . acetaminophen (TYLENOL) 500 MG tablet   Oral   Take 500 mg by mouth every 6 (six) hours as needed.         . dicyclomine (BENTYL) 20 MG tablet   Oral   Take 1 tablet (20 mg total) by mouth 3 (three) times daily as needed for spasms.   30 tablet   0   . ondansetron (ZOFRAN ODT) 8 MG disintegrating tablet   Oral   Take 1 tablet (8 mg total) by mouth every 8 (eight) hours as needed for nausea or vomiting.   20 tablet   0   . ranitidine (ZANTAC) 150 MG capsule   Oral   Take 1 capsule (150 mg total) by mouth 2 (two) times daily.   28 capsule   0     Allergies Nicotine  No family history on file.  Social History History  Substance Use Topics  . Smoking status: Current Every Day Smoker -- 1.00 packs/day    Types: Cigarettes  . Smokeless tobacco: Never Used  . Alcohol Use: Yes    Review of  Systems  Constitutional: No fever or chills. No weight changes Eyes:No blurry vision or double vision.  ENT: No sore throat. Cardiovascular: No chest pain. Respiratory: No dyspnea or cough. Gastrointestinal: Positive abdominal pain and vomiting and diarrhea as above.  No BRBPR or melena. Genitourinary: Negative for dysuria, urinary retention, bloody urine, or difficulty urinating. Musculoskeletal: Negative for back pain. No joint swelling or pain. Skin: Negative for rash. Neurological: Negative for headaches, focal weakness or numbness. Psychiatric:No anxiety or depression.   Endocrine:No hot/cold intolerance, changes in energy, or sleep difficulty.  10-point ROS otherwise negative.  ____________________________________________   PHYSICAL EXAM:  VITAL SIGNS: ED Triage Vitals  Enc Vitals Group     BP 06/23/15 1141 137/85 mmHg     Pulse Rate 06/23/15 1139 50     Resp 06/23/15 1139 18     Temp 06/23/15 1344 98 F (36.7 C)     Temp Source 06/23/15 1344 Oral     SpO2 06/23/15 1139 100 %     Weight 06/23/15 1139 115 lb (52.164 kg)     Height 06/23/15 1139 4\' 11"  (1.499 m)     Head Cir --      Peak Flow --      Pain Score 06/23/15 1140 9     Pain Loc --      Pain Edu? --  Excl. in GC? --      Constitutional: Alert and oriented. Well appearing and in no distress. Eyes: No scleral icterus. No conjunctival pallor. PERRL. EOMI ENT   Head: Normocephalic and atraumatic.   Nose: No congestion/rhinnorhea. No septal hematoma   Mouth/Throat: MMM, no pharyngeal erythema. No peritonsillar mass. No uvula shift.   Neck: No stridor. No SubQ emphysema. No meningismus. Hematological/Lymphatic/Immunilogical: No cervical lymphadenopathy. Cardiovascular: RRR. Normal and symmetric distal pulses are present in all extremities. No murmurs, rubs, or gallops. Respiratory: Normal respiratory effort without tachypnea nor retractions. Breath sounds are clear and equal bilaterally.  No wheezes/rales/rhonchi. Gastrointestinal: Soft and nontender. No distention. There is no CVA tenderness.  No rebound, rigidity, or guarding. Genitourinary: deferred Musculoskeletal: Nontender with normal range of motion in all extremities. No joint effusions.  No lower extremity tenderness.  No edema. Neurologic:   Normal speech and language.  CN 2-10 normal. Motor grossly intact. No pronator drift.  Normal gait. No gross focal neurologic deficits are appreciated.  Skin:  Skin is warm, dry and intact. No rash noted.  No petechiae, purpura, or bullae. Psychiatric: Mood and affect are normal. Speech and behavior are normal. Patient exhibits appropriate insight and judgment.  ____________________________________________    LABS (pertinent positives/negatives) (all labs ordered are listed, but only abnormal results are displayed) Labs Reviewed  COMPREHENSIVE METABOLIC PANEL - Abnormal; Notable for the following:    CO2 21 (*)    Glucose, Bld 161 (*)    Total Bilirubin 1.5 (*)    All other components within normal limits  CBC - Abnormal; Notable for the following:    WBC 18.8 (*)    RBC 5.30 (*)    All other components within normal limits  URINALYSIS COMPLETEWITH MICROSCOPIC ([facility] ONLY) - Abnormal; Notable for the following:    Color, Urine AMBER (*)    APPearance CLEAR (*)    Ketones, ur 2+ (*)    Protein, ur 100 (*)    Squamous Epithelial / LPF 0-5 (*)    All other components within normal limits  LIPASE, BLOOD  POC URINE PREG, ED   ____________________________________________   EKG    ____________________________________________    RADIOLOGY    ____________________________________________   PROCEDURES  ____________________________________________   INITIAL IMPRESSION / ASSESSMENT AND PLAN / ED COURSE  Pertinent labs & imaging results that were available during my care of the patient were reviewed by me and considered in my medical decision making (see  chart for details).  Patient presents with generalized abdominal pain nausea vomiting and diarrhea, with a reassuring abdominal exam. She is very well-appearing nontoxic no acute distress, calm and comfortable and smiling. She is energetic. Labs reveal a leukocytosis which is consistent with gastroenteritis, as well as ketosis on the urinalysis due to her inability to tolerate oral intake. We will give her IV D5 NS and antiemetics and then try to see if she can tolerate oral intake. ----------------------------------------- 4:55 PM on 06/23/2015 -----------------------------------------  Patient tolerating oral intake. Workup is otherwise unremarkable. She still has some abdominal discomfort although it remains nonfocal. Low suspicion for biliary disease or perforation or GU complications. We'll discharge home with antiemetics and antispasmodics. ____________________________________________   FINAL CLINICAL IMPRESSION(S) / ED DIAGNOSES  Final diagnoses:  Gastroenteritis, acute  Dehydration, mild      Sharman Cheek, MD 06/23/15 1656

## 2015-06-23 NOTE — ED Notes (Signed)
Pt able to eat package of graham crackers and drink cola. Continues to have pain. IV fluids still running in.

## 2015-06-23 NOTE — ED Notes (Signed)
C/o N/V/D since last night. Has vomited x3 since coming to ED, has had 3-4 loose stools since this morning.  Unable to keep anything down at this time including water.  Also reports when vomiting she gets sweaty and then once she vomits she gets cold chills.  Afebrile at this time.

## 2015-08-21 ENCOUNTER — Encounter: Payer: Self-pay | Admitting: Emergency Medicine

## 2015-08-21 ENCOUNTER — Emergency Department
Admission: EM | Admit: 2015-08-21 | Discharge: 2015-08-23 | Disposition: A | Discharge status: Other Acute Inpatient Hospital | Payer: Medicaid Other | Attending: Student | Admitting: Student

## 2015-08-21 DIAGNOSIS — F29 Unspecified psychosis not due to a substance or known physiological condition: Secondary | ICD-10-CM | POA: Diagnosis not present

## 2015-08-21 DIAGNOSIS — F1210 Cannabis abuse, uncomplicated: Secondary | ICD-10-CM | POA: Insufficient documentation

## 2015-08-21 DIAGNOSIS — Z79899 Other long term (current) drug therapy: Secondary | ICD-10-CM | POA: Insufficient documentation

## 2015-08-21 DIAGNOSIS — Z3202 Encounter for pregnancy test, result negative: Secondary | ICD-10-CM | POA: Diagnosis not present

## 2015-08-21 DIAGNOSIS — Z720 Tobacco use: Secondary | ICD-10-CM | POA: Diagnosis not present

## 2015-08-21 DIAGNOSIS — Z046 Encounter for general psychiatric examination, requested by authority: Secondary | ICD-10-CM | POA: Diagnosis present

## 2015-08-21 HISTORY — DX: Anxiety disorder, unspecified: F41.9

## 2015-08-21 LAB — URINE DRUG SCREEN, QUALITATIVE (ARMC ONLY)
Amphetamines, Ur Screen: NOT DETECTED
BENZODIAZEPINE, UR SCRN: NOT DETECTED
Barbiturates, Ur Screen: NOT DETECTED
CANNABINOID 50 NG, UR ~~LOC~~: POSITIVE — AB
Cocaine Metabolite,Ur ~~LOC~~: NOT DETECTED
MDMA (Ecstasy)Ur Screen: NOT DETECTED
METHADONE SCREEN, URINE: NOT DETECTED
Opiate, Ur Screen: NOT DETECTED
Phencyclidine (PCP) Ur S: NOT DETECTED
TRICYCLIC, UR SCREEN: NOT DETECTED

## 2015-08-21 LAB — CBC
HCT: 46.1 % (ref 35.0–47.0)
HEMOGLOBIN: 15.3 g/dL (ref 12.0–16.0)
MCH: 29.9 pg (ref 26.0–34.0)
MCHC: 33.1 g/dL (ref 32.0–36.0)
MCV: 90.3 fL (ref 80.0–100.0)
PLATELETS: 323 10*3/uL (ref 150–440)
RBC: 5.11 MIL/uL (ref 3.80–5.20)
RDW: 13.8 % (ref 11.5–14.5)
WBC: 12.1 10*3/uL — ABNORMAL HIGH (ref 3.6–11.0)

## 2015-08-21 LAB — COMPREHENSIVE METABOLIC PANEL
ALK PHOS: 75 U/L (ref 38–126)
ALT: 22 U/L (ref 14–54)
ANION GAP: 9 (ref 5–15)
AST: 28 U/L (ref 15–41)
Albumin: 4.7 g/dL (ref 3.5–5.0)
BUN: 14 mg/dL (ref 6–20)
CALCIUM: 9.2 mg/dL (ref 8.9–10.3)
CO2: 24 mmol/L (ref 22–32)
Chloride: 106 mmol/L (ref 101–111)
Creatinine, Ser: 0.75 mg/dL (ref 0.44–1.00)
Glucose, Bld: 103 mg/dL — ABNORMAL HIGH (ref 65–99)
Potassium: 3.8 mmol/L (ref 3.5–5.1)
Sodium: 139 mmol/L (ref 135–145)
Total Bilirubin: 1.4 mg/dL — ABNORMAL HIGH (ref 0.3–1.2)
Total Protein: 7.8 g/dL (ref 6.5–8.1)

## 2015-08-21 LAB — ETHANOL

## 2015-08-21 LAB — ACETAMINOPHEN LEVEL: Acetaminophen (Tylenol), Serum: <10 ug/mL — ABNORMAL LOW (ref 10–30)

## 2015-08-21 LAB — PREGNANCY, URINE: PREG TEST UR: NEGATIVE

## 2015-08-21 LAB — SALICYLATE LEVEL

## 2015-08-21 NOTE — ED Notes (Addendum)
BEHAVIORAL HEALTH ROUNDING Patient sleeping: No   Patient alert and oriented: Yes  Behavior appropriate: no If no, describe: Giggling, restless.  Talking about an internal fish tube that runs from her stomach all throughout her body.   Nutrition and fluids offered: Yes   Toileting and hygiene offered: Yes  Sitter present: Yes   Law enforcement present: Yes

## 2015-08-21 NOTE — ED Notes (Signed)
Pt transferred into ED BHU room 5 after receiving report from [facility]    Patient assigned to appropriate care area. Patient oriented to unit/care area: Informed that, for their safety, care areas are designed for safety and monitored by security cameras at all times; Visiting hours and phone times explained to patient. Patient verbalizes understanding, and verbal contract for safety obtained.    I introduced myself to her and discussed the plan of care  Pt verbalized understanding

## 2015-08-21 NOTE — ED Notes (Signed)
Patient AAOx3.  Skin warm and dry.  Answering some questions.  Avoids eye contact and turns head away.  Patient states that she is not feeling right mentally.  States she has not been feeling right since before she went to the beach.  Unable to say when she went to the beach.  States she takes Xanax and Percocet for medications, but states she has not been taking medications currently.  Unable to say when she last took medication.

## 2015-08-21 NOTE — ED Notes (Addendum)
BEHAVIORAL HEALTH ROUNDING Patient sleeping: Yes   Patient alert and oriented: Yes   Behavior appropriate: Yes   Nutrition and fluids offered: Yes  Toileting and hygiene offered: Yes   Sitter present: Yes  Law enforcement present:  Yes 

## 2015-08-21 NOTE — BH Assessment (Signed)
Assessment Note  Tricia Kerr is an 29 y.o. female who presents to [facility] ED after her boyfriend/ husband, [place] Snee, petitioned for Pt's involuntary commitment. Pt reports she doesn't know why she was here, "maybe the cooper fish tube that I got from before I went to the beach." Pt states she is wearing a sparkly shirt and a black skirt. Pt states she has three children, all of which live with their father Nida Boatman). Pt reports drinking approximately 5 beers, "a couple times" per week and states that is the amount she drank that last night. Pt's blood alcohol level is <5. Pt reports using majiuana and used "1 blunt" yesterday. Pt denies any other substance abuse and Pt's urine drug screen is only positive for cannabinoid.   Pt reports she has a history of Bi Polar. She reports feeling, "wonderful." Pt denies current suicidal ideation or any recent suicidal ideation or attempts. . Pt denies any suicide attempts. Pt denies homicidal ideation or history of violence. Pt denies any history of auditory or visual hallucinations. Pt denies access to firearms or other weapons. Pt. Has had a history as an adolescent of inpatient treatment at multiple facilities, including "[facility]."  Pt has not been on any medications for her Bi Polar in the past couple of years.  Pt reports she was at [facility], but she "died in the hospital." She states she is currently unemployed and has been unemployed for over a year. Pt denies any physical, verbal, or sexual abuse as a child.  Pt denies legal problems and [place] Offender Search indicates no prior convictions.  Pt is dressed in hospital scrubs, alert, oriented x3 with loud speech and normal motor behavior. Eye contact is fair. Pt's mood is euphoric and affect is congruent with mood. Pt was jovial at times. Thought process is circumstantial.  Pt is currently responding to unspecified delusions. Pt was pleasant and cooperative throughout assessment.   Attempted to contact  petitioner, [place] Storbeck, to obtain collateral information but he did not answer.   Axis I: Bipolar, Manic  Past Medical History:  Past Medical History  Diagnosis Date  . Anxiety     Past Surgical History  Procedure Laterality Date  . Appendectomy    . Tubial      Family History: No family history on file.  Social History:  reports that she has been smoking Cigarettes.  She has been smoking about 1.00 pack per day. She has never used smokeless tobacco. She reports that she drinks alcohol. She reports that she does not use illicit drugs.  Additional Social History:  Alcohol / Drug Use Pain Medications: None Reported Prescriptions: None Reported Over the Counter: None Reported History of alcohol / drug use?: Yes Longest period of sobriety (when/how long): N/a Substance #1 Name of Substance 1: Alcohol 1 - Age of First Use: Pt. couldn't remember 1 - Amount (size/oz): "5 beers" 1 - Frequency: couple times a week 1 - Duration: couple years 1 - Last Use / Amount: 08/21/15 Substance #2 Name of Substance 2: Marijuana 2 - Age of First Use: Pt. couldn't remember 2 - Amount (size/oz): "1 blunt" 2 - Frequency: daily 2 - Duration: 4 years 2 - Last Use / Amount: 08/21/15  CIWA: CIWA-Ar BP: 137/83 mmHg Pulse Rate: 83 COWS:    Allergies:  Allergies  Allergen Reactions  . Nicotine Anaphylaxis    Nicotine patches     Home Medications:  (Not in a hospital admission)  OB/GYN Status:  No LMP recorded (  lmp unknown).  General Assessment Data Location of Assessment: [facility] ED TTS Assessment: In system Is this a Tele or Face-to-Face Assessment?: Face-to-Face Is this an Initial Assessment or a Re-assessment for this encounter?: Initial Assessment Marital status: Single Maiden name: N/a Is patient pregnant?: No Pregnancy Status: No Living Arrangements: Spouse/significant other ([place] Gapinski) Can pt return to current living arrangement?: Yes Admission Status: Involuntary Is  patient capable of signing voluntary admission?: Yes Referral Source: Self/Family/Friend Insurance type: Medicaid  Medical Screening Exam [facility] Walk-in ONLY) Medical Exam completed: Yes  Crisis Care Plan Living Arrangements: Spouse/significant other ([place]aly Mcgrail) Name of Psychiatrist: Dr. Mervyn Skeeters Name of Therapist: N/a  Education Status Is patient currently in school?: No Current Grade: 0 Highest grade of school patient has completed: GED Name of school: N/A Contact person: [place] Fabro [phone]  Risk to self with the past 6 months Suicidal Ideation: No Has patient been a risk to self within the past 6 months prior to admission? : No Suicidal Intent: No Has patient had any suicidal intent within the past 6 months prior to admission? : No Is patient at risk for suicide?: No Suicidal Plan?: No Has patient had any suicidal plan within the past 6 months prior to admission? : No Access to Means: No What has been your use of drugs/alcohol within the last 12 months?: Alcohol, "5 beers" a couple times a week. Marijuana, "1 blunt" daily Previous Attempts/Gestures: No How many times?: 0 Other Self Harm Risks: 0 Triggers for Past Attempts: None known Intentional Self Injurious Behavior: None Family Suicide History: No Persecutory voices/beliefs?: No Depression: No Substance abuse history and/or treatment for substance abuse?: No Suicide prevention information given to non-admitted patients: Not applicable  Risk to Others within the past 6 months Homicidal Ideation: No Does patient have any lifetime risk of violence toward others beyond the six months prior to admission? : No Thoughts of Harm to Others: No Current Homicidal Intent: No Current Homicidal Plan: No Access to Homicidal Means: No Identified Victim: None Noted History of harm to others?: No Assessment of Violence: None Noted Violent Behavior Description: None Noted Does patient have access to weapons?: No Criminal  Charges Pending?: No Does patient have a court date: No Is patient on probation?: No  Psychosis Hallucinations: None noted Delusions: Unspecified  Mental Status Report Appearance/Hygiene: Bizarre, Disheveled Eye Contact: Fair Motor Activity: Restlessness, Shuffling Speech: Other (Comment) (circumstantial) Level of Consciousness: Restless, Alert Mood: Ambivalent, Euphoric Affect: Euphoric Anxiety Level: None Thought Processes: Circumstantial Judgement: Impaired Orientation: Person, Place, Appropriate for developmental age Obsessive Compulsive Thoughts/Behaviors: None  Cognitive Functioning Concentration: Normal Memory: Recent Impaired, Remote Impaired IQ: Average Insight: Fair Impulse Control: Fair Appetite: Good Weight Loss: 0 Weight Gain: 0 Sleep: No Change Total Hours of Sleep: 5 Vegetative Symptoms: None  ADLScreening [facility] Assessment Services) Patient's cognitive ability adequate to safely complete daily activities?: Yes Patient able to express need for assistance with ADLs?: Yes Independently performs ADLs?: Yes (appropriate for developmental age)  Prior Inpatient Therapy Prior Inpatient Therapy: Yes Prior Therapy Dates: couldn't remember Prior Therapy Facilty/Provider(s): [facility] Reason for Treatment: Bi Polar  Prior Outpatient Therapy Prior Outpatient Therapy: Yes Prior Therapy Dates: couldn't remember Prior Therapy Facilty/Provider(s): "Dr. Mervyn Skeeters" Reason for Treatment: Marriage counseling Does patient have an ACCT team?: No Does patient have Intensive In-House Services?  : No Does patient have [facility] services? : No Does patient have P4CC services?: No  ADL Screening (condition at time of admission) Patient's cognitive ability adequate to safely complete daily activities?:  Yes Patient able to express need for assistance with ADLs?: Yes Independently performs ADLs?: Yes (appropriate for developmental age)       Abuse/Neglect Assessment (Assessment to  be complete while patient is alone) Physical Abuse: Denies Verbal Abuse: Denies Sexual Abuse: Denies Exploitation of patient/patient's resources: Denies Self-Neglect: Denies Values / Beliefs Cultural Requests During Hospitalization: None Spiritual Requests During Hospitalization: None Consults Spiritual Care Consult Needed: No Social Work Consult Needed: No      Additional Information 1:1 In Past 12 Months?: No CIRT Risk: No Elopement Risk: No Does patient have medical clearance?: Yes     Disposition:  Disposition Initial Assessment Completed for this Encounter: Yes Disposition of Patient: Other dispositions Other disposition(s): Other (Comment) (Psych MD Consult)  On Site Evaluation by:   Reviewed with Physician: Dr. Trudie Reed, LPCA 08/21/2015 6:34 PM

## 2015-08-21 NOTE — ED Notes (Signed)
Report received from RN Amy T  Pt in dayroom. No complaints or concerns voiced at this time. Pt apparently reacting to internal stimuli at this time. Will continue to monitor with q15 min checks and security camera monitoring. ODS officer in area.

## 2015-08-21 NOTE — ED Notes (Signed)
She is sitting in a recliner in the dayroom - she is smiling and staring off  Pt answers my questions and interacts appropriately

## 2015-08-21 NOTE — ED Notes (Signed)
Pt in  dayroom. No complaints or concerns voiced at this time.Pt engaging in conversation with other pt at this time. Will continue to monitor with q15 min checks and security camera monitoring. ODS officer in area

## 2015-08-21 NOTE — ED Notes (Signed)

## 2015-08-21 NOTE — ED Notes (Signed)

## 2015-08-21 NOTE — ED Notes (Addendum)
BIB police from home with IVC paperwork. Husband reports pt has been hallucinating. Pt denies SI/HI at this time

## 2015-08-21 NOTE — ED Notes (Signed)
BEHAVIORAL HEALTH ROUNDING Patient sleeping: No  Patient alert and oriented: Yes   Behavior appropriate: Yes   Nutrition and fluids offered: Yes  Toileting and hygiene offered: Yes  Sitter present: Yes  Law enforcement present: Yes  

## 2015-08-21 NOTE — ED Notes (Signed)
BEHAVIORAL HEALTH ROUNDING Patient sleeping: No. Patient alert and oriented: yes Behavior appropriate: Yes.   Nutrition and fluids offered: Yes  Toileting and hygiene offered: Yes  Sitter present: q15 min observations and security camera monitoring Law enforcement present: Yes [employer]  ENVIRONMENTAL ASSESSMENT Potentially harmful objects out of patient reach: Yes.   Personal belongings secured: Yes.   Patient dressed in hospital provided attire only: Yes.   Plastic bags out of patient reach: Yes.   Patient care equipment removed: Yes.   Equipment and supplies removed: Yes.   Potentially toxic materials out of patient reach: Yes.   Sharps container removed or out of patient reach: Yes.   ED BHU PLACEMENT JUSTIFICATION Is the patient under IVC or is there intent for IVC: Yes.    Is IVC current? Yes Is the patient medically cleared: Yes.   Is there vacancy in the ED BHU: Yes.   Is the population mix appropriate for patient: Yes.   Is the patient awaiting placement in inpatient or outpatient setting: unknown at this time.   Has the patient had a psychiatric consult: No  Survey of unit performed for contraband, proper placement and condition of furniture, tampering with fixtures in bathroom, shower, and each patient room: Yes.  ; Findings: none APPEARANCE/BEHAVIOR Cooperative,calm NEURO ASSESSMENT Orientation: person Hallucinations: reacting to internal stimulil Speech: Normal Gait: normal RESPIRATORY ASSESSMENT Breathing Pattern-regular, no respiratory distress noted CARDIOVASCULAR ASSESSMENT Skin color appropriate for age and race GASTROINTESTINAL ASSESSMENT no GI distress noted EXTREMITIES Moves all extremities, no distress noted PLAN OF CARE Provide calm/safe environment. Vital signs assessed twice daily. ED BHU Assessment once each 12-hour shift. Collaborate with intake RN daily or as condition indicates. Assure the ED provider has rounded once each shift. Provide and  encourage hygiene. Provide redirection as needed. Assess for escalating behavior; address immediately and inform ED provider.  Assess family dynamic and appropriateness for visitation as needed: Yes.  Educate the patient/family about BHU procedures/visitation: Yes.

## 2015-08-21 NOTE — ED Provider Notes (Signed)
[facility] Emergency Department Provider Note  ____________________________________________  Time seen: Approximately 430 PM  I have reviewed the triage vital signs and the nursing notes.   HISTORY  Chief Complaint Psychiatric Evaluation    HPI Tricia Kerr is a 29 y.o. female is a 29 year old female with bipolar disorder who presents to the emergency department under involuntary commitment. The patient denies any hallucinations, suicidal or homicidal ideation. However, per her involuntary commitment paperwork she has been making statements that are mock in reality. Patient told the practitioner that there are people on a roof radicular with guns, that there is $53 million worth of diamond in her stomach, that she paid someone to extract eggs out of her and her 72 year old daughter.  Also with threatening and abusive behavior at home for the involuntary commitment. Patient denies any pain at this time or any ingestion.   Past Medical History  Diagnosis Date  . Anxiety     There are no active problems to display for this patient.   Past Surgical History  Procedure Laterality Date  . Appendectomy    . Tubial      Current Outpatient Rx  Name  Route  Sig  Dispense  Refill  . acetaminophen (TYLENOL) 500 MG tablet   Oral   Take 500 mg by mouth every 6 (six) hours as needed.         . dicyclomine (BENTYL) 20 MG tablet   Oral   Take 1 tablet (20 mg total) by mouth 3 (three) times daily as needed for spasms.   30 tablet   0   . ondansetron (ZOFRAN ODT) 8 MG disintegrating tablet   Oral   Take 1 tablet (8 mg total) by mouth every 8 (eight) hours as needed for nausea or vomiting.   20 tablet   0   . ranitidine (ZANTAC) 150 MG capsule   Oral   Take 1 capsule (150 mg total) by mouth 2 (two) times daily.   28 capsule   0     Allergies Nicotine  No family history on file.  Social History Social History  Substance Use Topics  . Smoking  status: Current Every Day Smoker -- 1.00 packs/day    Types: Cigarettes  . Smokeless tobacco: Never Used  . Alcohol Use: Yes    Review of Systems Constitutional: No fever/chills Eyes: No visual changes. ENT: No sore throat. Cardiovascular: Denies chest pain. Respiratory: Denies shortness of breath. Gastrointestinal: No abdominal pain.  No nausea, no vomiting.  No diarrhea.  No constipation. Genitourinary: Negative for dysuria. Musculoskeletal: Negative for back pain. Skin: Negative for rash. Neurological: Negative for headaches, focal weakness or numbness.  10-point ROS otherwise negative.  ____________________________________________   PHYSICAL EXAM:  VITAL SIGNS: ED Triage Vitals  Enc Vitals Group     BP 08/21/15 1607 137/83 mmHg     Pulse Rate 08/21/15 1607 83     Resp 08/21/15 1607 18     Temp 08/21/15 1607 98.4 F (36.9 C)     Temp Source 08/21/15 1607 Oral     SpO2 08/21/15 1607 100 %     Weight 08/21/15 1607 108 lb (48.988 kg)     Height 08/21/15 1607 5' (1.524 m)     Head Cir --      Peak Flow --      Pain Score --      Pain Loc --      Pain Edu? --      Excl.  in GC? --     Constitutional: Alert and oriented. Well appearing and in no acute distress. Eyes: Conjunctivae are normal. PERRL. EOMI. Head: Atraumatic. Nose: No congestion/rhinnorhea. Mouth/Throat: Mucous membranes are moist.  Oropharynx non-erythematous. Neck: No stridor.   Cardiovascular: Normal rate, regular rhythm. Grossly normal heart sounds.  Good peripheral circulation. Respiratory: Normal respiratory effort.  No retractions. Lungs CTAB. Gastrointestinal: Soft and nontender. No distention. No abdominal bruits. No CVA tenderness. Musculoskeletal: No lower extremity tenderness nor edema.  No joint effusions. Neurologic:  Normal speech and language. No gross focal neurologic deficits are appreciated. No gait instability. Skin:  Skin is warm, dry and intact. No rash noted. Psychiatric: Odd  affect. Laughs inappropriately at times during the interview.  ____________________________________________   LABS (all labs ordered are listed, but only abnormal results are displayed)  Labs Reviewed  COMPREHENSIVE METABOLIC PANEL - Abnormal; Notable for the following:    Glucose, Bld 103 (*)    Total Bilirubin 1.4 (*)    All other components within normal limits  ACETAMINOPHEN LEVEL - Abnormal; Notable for the following:    Acetaminophen (Tylenol), Serum <10 (*)    All other components within normal limits  CBC - Abnormal; Notable for the following:    WBC 12.1 (*)    All other components within normal limits  URINE DRUG SCREEN, QUALITATIVE ([facility] ONLY) - Abnormal; Notable for the following:    Cannabinoid 50 Ng, Ur [place] POSITIVE (*)    All other components within normal limits  ETHANOL  SALICYLATE LEVEL  PREGNANCY, URINE   ____________________________________________  EKG   ____________________________________________  RADIOLOGY   ____________________________________________   PROCEDURES    ____________________________________________   INITIAL IMPRESSION / ASSESSMENT AND PLAN / ED COURSE  Pertinent labs & imaging results that were available during my care of the patient were reviewed by me and considered in my medical decision making (see chart for details).  We'll uphold involuntary commitment. Psychiatry to see. ____________________________________________   FINAL CLINICAL IMPRESSION(S) / ED DIAGNOSES  Acute psychosis.    Myrna Blazer, MD 08/22/15 [phone]

## 2015-08-22 MED ORDER — CITALOPRAM HYDROBROMIDE 20 MG PO TABS
20.0000 mg | ORAL_TABLET | Freq: Every day | ORAL | Status: DC
  Administered 2015-08-22 – 2015-08-23 (×2): 20 mg via ORAL
  Filled 2015-08-22 (×2): qty 1

## 2015-08-22 MED ORDER — RISPERIDONE 1 MG PO TBDP
1.0000 mg | ORAL_TABLET | Freq: Two times a day (BID) | ORAL | Status: DC
  Administered 2015-08-22 – 2015-08-23 (×2): 1 mg via ORAL
  Filled 2015-08-22 (×6): qty 1

## 2015-08-22 NOTE — ED Notes (Signed)
Patient was told that she is on an IVC commitment that was initiated by her husband; and that she would be hospitalized on the inpatient behavioral health unit at [facility]. She was not able to accept this and kept saying "no" to explanations. Patient was not responsive to reality orientation or reassurance. Will continue to closely monitor behaviors and maintain on 15 minute checks.

## 2015-08-22 NOTE — ED Notes (Signed)

## 2015-08-22 NOTE — Consult Note (Signed)
[facility] Consult   Reason for Consult:  Follow up Referring Physician:  Er Patient Identification: Tricia Kerr MRN:  [id number] Principal Diagnosis: <principal problem not specified> Diagnosis:  There are no active problems to display for this patient.   Total Time spent with patient: 1 hour  Subjective:   Tricia Kerr is a 29 y.o. female patient admitted with inappropriate and bizare behavior. Pt is married and calls her husband "baby's father. She has been stating that she has diamonds and gold in her stomach and has a fish tube in her belly and went to her heat. HPI:  Has  Been smoking THC according to family who stated that there are people on her roof trying to kill her an has been having delusions recently. HPI Elements:     Past Medical History:  Past Medical History  Diagnosis Date  . Anxiety     Past Surgical History  Procedure Laterality Date  . Appendectomy    . Tubial     Family History: No family history on file. Social History:  History  Alcohol Use  . Yes     History  Drug Use No    Social History   Social History  . Marital Status: Single    Spouse Name: N/A  . Number of Children: N/A  . Years of Education: N/A   Social History Main Topics  . Smoking status: Current Every Day Smoker -- 1.00 packs/day    Types: Cigarettes  . Smokeless tobacco: Never Used  . Alcohol Use: Yes  . Drug Use: No  . Sexual Activity: Not Asked   Other Topics Concern  . None   Social History Narrative   Additional Social History:    Pain Medications: None Reported Prescriptions: None Reported Over the Counter: None Reported History of alcohol / drug use?: Yes Longest period of sobriety (when/how long): N/a Name of Substance 1: Alcohol 1 - Age of First Use: Pt. couldn't remember 1 - Amount (size/oz): "5 beers" 1 - Frequency: couple times a week 1 - Duration: couple years 1 - Last Use / Amount: 08/21/15 Name of Substance 2:  Marijuana 2 - Age of First Use: Pt. couldn't remember 2 - Amount (size/oz): "1 blunt" 2 - Frequency: daily 2 - Duration: 4 years 2 - Last Use / Amount: 08/21/15                 Allergies:   Allergies  Allergen Reactions  . Nicotine Anaphylaxis    Nicotine patches     Labs:  Results for orders placed or performed during the hospital encounter of 08/21/15 (from the past 48 hour(s))  Comprehensive metabolic panel     Status: Abnormal   Collection Time: 08/21/15  4:14 PM  Result Value Ref Range   Sodium 139 135 - 145 mmol/L   Potassium 3.8 3.5 - 5.1 mmol/L   Chloride 106 101 - 111 mmol/L   CO2 24 22 - 32 mmol/L   Glucose, Bld 103 (H) 65 - 99 mg/dL   BUN 14 6 - 20 mg/dL   Creatinine, Ser 0.75 0.44 - 1.00 mg/dL   Calcium 9.2 8.9 - 10.3 mg/dL   Total Protein 7.8 6.5 - 8.1 g/dL   Albumin 4.7 3.5 - 5.0 g/dL   AST 28 15 - 41 U/L   ALT 22 14 - 54 U/L   Alkaline Phosphatase 75 38 - 126 U/L   Total Bilirubin 1.4 (H) 0.3 - 1.2 mg/dL   GFR calc  non Af Amer >60 >60 mL/min   GFR calc Af Amer >60 >60 mL/min    Comment: (NOTE) The eGFR has been calculated using the CKD EPI equation. This calculation has not been validated in all clinical situations. eGFR's persistently <60 mL/min signify possible Chronic Kidney Disease.    Anion gap 9 5 - 15  Ethanol (ETOH)     Status: None   Collection Time: 08/21/15  4:14 PM  Result Value Ref Range   Alcohol, Ethyl (B) <5 <5 mg/dL    Comment:        LOWEST DETECTABLE LIMIT FOR SERUM ALCOHOL IS 5 mg/dL FOR MEDICAL PURPOSES ONLY   Salicylate level     Status: None   Collection Time: 08/21/15  4:14 PM  Result Value Ref Range   Salicylate Lvl <8.5 2.8 - 30.0 mg/dL  Acetaminophen level     Status: Abnormal   Collection Time: 08/21/15  4:14 PM  Result Value Ref Range   Acetaminophen (Tylenol), Serum <10 (L) 10 - 30 ug/mL    Comment:        THERAPEUTIC CONCENTRATIONS VARY SIGNIFICANTLY. A RANGE OF 10-30 ug/mL MAY BE AN  EFFECTIVE CONCENTRATION FOR MANY PATIENTS. HOWEVER, SOME ARE BEST TREATED AT CONCENTRATIONS OUTSIDE THIS RANGE. ACETAMINOPHEN CONCENTRATIONS >150 ug/mL AT 4 HOURS AFTER INGESTION AND >50 ug/mL AT 12 HOURS AFTER INGESTION ARE OFTEN ASSOCIATED WITH TOXIC REACTIONS.   CBC     Status: Abnormal   Collection Time: 08/21/15  4:14 PM  Result Value Ref Range   WBC 12.1 (H) 3.6 - 11.0 K/uL   RBC 5.11 3.80 - 5.20 MIL/uL   Hemoglobin 15.3 12.0 - 16.0 g/dL   HCT 46.1 35.0 - 47.0 %   MCV 90.3 80.0 - 100.0 fL   MCH 29.9 26.0 - 34.0 pg   MCHC 33.1 32.0 - 36.0 g/dL   RDW 13.8 11.5 - 14.5 %   Platelets 323 150 - 440 K/uL  Urine Drug Screen, Qualitative ([facility] only)     Status: Abnormal   Collection Time: 08/21/15  5:57 PM  Result Value Ref Range   Tricyclic, Ur Screen NONE DETECTED NONE DETECTED   Amphetamines, Ur Screen NONE DETECTED NONE DETECTED   MDMA (Ecstasy)Ur Screen NONE DETECTED NONE DETECTED   Cocaine Metabolite,Ur [place] NONE DETECTED NONE DETECTED   Opiate, Ur Screen NONE DETECTED NONE DETECTED   Phencyclidine (PCP) Ur S NONE DETECTED NONE DETECTED   Cannabinoid 50 Ng, Ur [place] POSITIVE (A) NONE DETECTED   Barbiturates, Ur Screen NONE DETECTED NONE DETECTED   Benzodiazepine, Ur Scrn NONE DETECTED NONE DETECTED   Methadone Scn, Ur NONE DETECTED NONE DETECTED    Comment: (NOTE) 631  Tricyclics, urine               Cutoff 1000 ng/mL 200  Amphetamines, urine             Cutoff 1000 ng/mL 300  MDMA (Ecstasy), urine           Cutoff 500 ng/mL 400  Cocaine Metabolite, urine       Cutoff 300 ng/mL 500  Opiate, urine                   Cutoff 300 ng/mL 600  Phencyclidine (PCP), urine      Cutoff 25 ng/mL 700  Cannabinoid, urine              Cutoff 50 ng/mL 800  Barbiturates, urine  Cutoff 200 ng/mL 900  Benzodiazepine, urine           Cutoff 200 ng/mL 1000 Methadone, urine                Cutoff 300 ng/mL 1100 1200 The urine drug screen provides only a preliminary,  unconfirmed 1300 analytical test result and should not be used for non-medical 1400 purposes. Clinical consideration and professional judgment should 1500 be applied to any positive drug screen result due to possible 1600 interfering substances. A more specific alternate chemical method 1700 must be used in order to obtain a confirmed analytical result.  1800 Gas chromato graphy / mass spectrometry (GC/MS) is the preferred 1900 confirmatory method.   Pregnancy, urine     Status: None   Collection Time: 08/21/15  5:57 PM  Result Value Ref Range   Preg Test, Ur NEGATIVE NEGATIVE    Vitals: Blood pressure 141/74, pulse 104, temperature 98.4 F (36.9 C), temperature source Oral, resp. rate 18, height 5' (1.524 m), weight 108 lb (48.988 kg), SpO2 99 %.  Risk to Self: Suicidal Ideation: No Suicidal Intent: No Is patient at risk for suicide?: No Suicidal Plan?: No Access to Means: No What has been your use of drugs/alcohol within the last 12 months?: Alcohol, "5 beers" a couple times a week. Marijuana, "1 blunt" daily How many times?: 0 Other Self Harm Risks: 0 Triggers for Past Attempts: None known Intentional Self Injurious Behavior: None Risk to Others: Homicidal Ideation: No Thoughts of Harm to Others: No Current Homicidal Intent: No Current Homicidal Plan: No Access to Homicidal Means: No Identified Victim: None Noted History of harm to others?: No Assessment of Violence: None Noted Violent Behavior Description: None Noted Does patient have access to weapons?: No Criminal Charges Pending?: No Does patient have a court date: No Prior Inpatient Therapy: Prior Inpatient Therapy: Yes Prior Therapy Dates: couldn't remember Prior Therapy Facilty/Provider(s): [facility] Reason for Treatment: [employer] Prior Outpatient Therapy: Prior Outpatient Therapy: Yes Prior Therapy Dates: couldn't remember Prior Therapy Facilty/Provider(s): "Dr. Loni Muse" Reason for Treatment: Marriage  counseling Does patient have an ACCT team?: No Does patient have Intensive In-House Services?  : No Does patient have [facility] services? : No Does patient have P4CC services?: No  No current facility-administered medications for this encounter.   Current Outpatient Prescriptions  Medication Sig Dispense Refill  . acetaminophen (TYLENOL) 500 MG tablet Take 500 mg by mouth every 6 (six) hours as needed.    . dicyclomine (BENTYL) 20 MG tablet Take 1 tablet (20 mg total) by mouth 3 (three) times daily as needed for spasms. 30 tablet 0  . ondansetron (ZOFRAN ODT) 8 MG disintegrating tablet Take 1 tablet (8 mg total) by mouth every 8 (eight) hours as needed for nausea or vomiting. 20 tablet 0  . ranitidine (ZANTAC) 150 MG capsule Take 1 capsule (150 mg total) by mouth 2 (two) times daily. 28 capsule 0    Musculoskeletal: Strength & Muscle Tone: within normal limits Gait & Station: normal Patient leans: N/A  Psychiatric Specialty Exam: Physical Exam  Nursing note and vitals reviewed.   ROS  Blood pressure 141/74, pulse 104, temperature 98.4 F (36.9 C), temperature source Oral, resp. rate 18, height 5' (1.524 m), weight 108 lb (48.988 kg), SpO2 99 %.Body mass index is 21.09 kg/(m^2).  General Appearance: Bizarre  Eye Contact::  Fair  Speech:  Normal Rate  Volume:  Normal  Mood:  Anxious, Irritable and poor  Affect:  Non-Congruent, Constricted, Depressed,  Inappropriate and Labile  Thought Process:  Circumstantial  Orientation:  Full (Time, Place, and Person)  Thought Content:  Delusions  Suicidal Thoughts:  No  Homicidal Thoughts:  No  Memory:  Immediate;   Fair Recent;   Poor Remote;   Fair poor  Judgement:  Poor  Insight:  Lacking  Psychomotor Activity:  Increased  Concentration:  Poor  Recall:  [employer] of Knowledge:Fair  Language: Fair  Akathisia:  No  Handed:  Right  AIMS (if indicated):     Assets:  Social Support  ADL's:  Intact  Cognition: Impaired,  Mild   Sleep:      Medical Decision Making: Review of Psycho-Social Stressors (1)  Treatment Plan Summary: Plan Admit pt to Inpt for further help when bed is available and pt is medically cleared  Plan:  Recommend psychiatric Inpatient admission when medically cleared. Disposition: as above  Dewain Penning 08/22/2015 4:34 PM

## 2015-08-22 NOTE — ED Notes (Signed)
BEHAVIORAL HEALTH ROUNDING  Patient sleeping: No.  Patient alert and oriented: yes  Behavior appropriate: Yes. ; If no, describe:  Nutrition and fluids offered: Yes  Toileting and hygiene offered: Yes  Sitter present: not applicable  Law enforcement present: Yes [employer]  

## 2015-08-22 NOTE — ED Notes (Signed)
Patient resting quietly in room. Alert and oriented. Appetite good, taking adequate fluids. Patient states she does not know why she is in the hospital, she is denying reported behaviors and hallucinations.  Maintained on 15 minute checks and observation by security camera for safety.

## 2015-08-22 NOTE — ED Notes (Signed)
Patient sitting in chair in hallway outside her room. Maintained on 15 minute checks and observation by security camera for safety.

## 2015-08-22 NOTE — ED Notes (Signed)
Pt in room sleeping at this time. No abnormal behavior noted at this time. Will continue to monitor with q15 min checks and security camera monitoring. [employer] officer in area. 

## 2015-08-22 NOTE — ED Notes (Signed)
Patient cooperative with taking ordered medications. Medication education given, patient verbalizes understanding. Maintained on 15 minute checks and observation by security camera for safety.

## 2015-08-22 NOTE — BHH Counselor (Signed)
Writer spoke with patient to reassessed their current mental state. Patient is currently denying SI/HI and AV/H. Her thoughts are disorganized and delusional. Writer also spoke with patient's husband ([place] [phone]) and reported the patient started making crazy claims. Saying she have diamonds in her stomach, making weird hand gestures and saying other crazy stuff." Per husband report, this is unusual and new behaviors for her. He also reports she isn't taking any psychotropic medications.

## 2015-08-22 NOTE — ED Notes (Signed)
Patient resting comfortably in room. No complaints or concerns voiced. No distress or abnormal behavior noted. Will continue to monitor with security cameras. Q 15 minute rounds continue. 

## 2015-08-22 NOTE — ED Notes (Signed)
BEHAVIORAL HEALTH ROUNDING Patient sleeping: Yes.   Patient alert and oriented: not applicable Behavior appropriate: Yes.    Nutrition and fluids offered: No Toileting and hygiene offered: No Sitter present: q15 minute observations and security camera monitoring Law enforcement present: Yes [employer] 

## 2015-08-22 NOTE — ED Notes (Signed)
ENVIRONMENTAL ASSESSMENT  Potentially harmful objects out of patient reach: Yes.  Personal belongings secured: Yes.  Patient dressed in hospital provided attire only: Yes.  Plastic bags out of patient reach: Yes.  Patient care equipment (cords, cables, call bells, lines, and drains) shortened, removed, or accounted for: Yes.  Equipment and supplies removed from bottom of stretcher: Yes.  Potentially toxic materials out of patient reach: Yes.  Sharps container removed or out of patient reach: Yes.   BEHAVIORAL HEALTH ROUNDING  Patient sleeping: No.  Patient alert and oriented: yes  Behavior appropriate: Yes. ; If no, describe:  Nutrition and fluids offered: Yes  Toileting and hygiene offered: Yes  Sitter present: not applicable  Law enforcement present: Yes [employer]  ED BHU PLACEMENT JUSTIFICATION  Is the patient under IVC or is there intent for IVC: Yes.  Is the patient medically cleared: Yes.  Is there vacancy in the ED BHU: Yes.  Is the population mix appropriate for patient: Yes.  Is the patient awaiting placement in inpatient or outpatient setting: Yes.  Has the patient had a psychiatric consult: Yes.  Survey of unit performed for contraband, proper placement and condition of furniture, tampering with fixtures in bathroom, shower, and each patient room: Yes. ; Findings: All clear  APPEARANCE/BEHAVIOR  calm, cooperative and adequate rapport can be established  NEURO ASSESSMENT  Orientation: time, place and person  Hallucinations: No.None noted (Hallucinations)  Speech: Normal  Gait: normal  RESPIRATORY ASSESSMENT  WNL  CARDIOVASCULAR ASSESSMENT  WNL  GASTROINTESTINAL ASSESSMENT  WNL  EXTREMITIES  WNL  PLAN OF CARE  Provide calm/safe environment. Vital signs assessed twice daily. ED BHU Assessment once each 12-hour shift. Collaborate with intake RN daily or as condition indicates. Assure the ED provider has rounded once each shift. Provide and encourage hygiene. Provide  redirection as needed. Assess for escalating behavior; address immediately and inform ED provider.  Assess family dynamic and appropriateness for visitation as needed: Yes. ; If necessary, describe findings:  Educate the patient/family about BHU procedures/visitation: Yes. ; If necessary, describe findings: Pt is calm and cooperative at this time. Pt understanding and accepting of unit procedures/rules. Will continue to monitor.  

## 2015-08-22 NOTE — ED Notes (Addendum)
Meal served. Patient was offered a shower but refused. Patient is alert and oriented. Her mood is mildly euphoric with bizarre affect. Thoughts remain delusional and illogical. She denies SI or HI. Maintained on 15 minute checks and observation by security camera for safety.

## 2015-08-22 NOTE — ED Notes (Signed)
BEHAVIORAL HEALTH ROUNDING Patient sleeping: No. Patient alert and oriented: yes Behavior appropriate: Yes.   Nutrition and fluids offered: Yes  Toileting and hygiene offered: Yes  Sitter present: q15 min observations and security camera monitoring Law enforcement present: Yes [employer] 

## 2015-08-22 NOTE — ED Notes (Addendum)
Patient resting comfortably in room.  She spoke with her husband on the phone. No complaints or concerns voiced. No distress or abnormal behavior noted. Will continue to monitor with security cameras. Q 15 minute rounds continue.

## 2015-08-22 NOTE — ED Notes (Signed)
MD at bedside. 

## 2015-08-22 NOTE — ED Notes (Signed)
Pt requested that I come and talk to her room by [employer]. Pt stating to me " I don't belong here, I am not supposed to be in this hospital, I have died her one, no three times." I informed pt the she was here under IVC so that she could technically be held her for up to 72 hours. But the best thing to do was try to relax and wait until the morning when she would see the psychiatrist.  Pt then stated "I have enough money I can go anywhere and see a psychiatrist. You see this (showing me wrist band) it says [place] Pask call him and he will tell you. He can come and pick me up". I reinforced with pt that I could not call [place] because pt had not been designated for discharge yet and she would need to see a psychiatrist prior to discharge.  Pt then told me about 4 or 5 other names that I could call and they would tell me and they would come and get her. I again reinforced that she could not leave until she sees a psychiatrist. Pt then raised her voice and started to demand that I "call the [place] police and get a [place] police offer here and they would set me right". I reinforced again that I could not contact these people because she is IVC and the magistrate in [place] may have been the person that signed the papers that were issued to place her under IVC. Pt then demanded "I want to see a police officer, I don't want to talk to you anymore"> I informed pt that I would contact the officer at the front desk but he would tell her what I had told her already. I contacted the [employer] officer who came back to the unit and to the pt's door and reinforced what I had told her about being IVC, to just go with the flow and see the psychiatrist in the morning. Pt continued to reinforce to the officer "I didn't sign any papers the police signed them". "I need to be at [facility], I am not supposed to be at this hospital, I have a copper tube in my body,they have killed at this hospital" Officer then reinforced  statements again and pt finally sighed and looked at the floor. Officer Timothy Lasso, [employer] Officer and myself then left the patient's doorway and returned to office.

## 2015-08-22 NOTE — ED Notes (Signed)
Pt in room. No complaints or concerns voiced at this time. No abnormal behavior noted at this time. Will continue to monitor with q15 min checks and security camera monitoring. [employer] officer in area. 

## 2015-08-22 NOTE — ED Notes (Signed)
Patient had to have room moved to accommodate new patient and was cooperative with transfer. She continues to assert that reported behaviors did not occur. Her speech is clear and coherent, thoughts are organized. No evidence of hallucinations or delusional thinking at this time. Maintain on 15 minute checks for safety.

## 2015-08-22 NOTE — ED Notes (Signed)
Meal served 

## 2015-08-23 ENCOUNTER — Inpatient Hospital Stay
Admission: EM | Admit: 2015-08-23 | Discharge: 2015-09-09 | DRG: 885 | Disposition: A | Discharge status: Home / Self Care | Payer: Medicaid Other | Source: Intra-hospital | Attending: Psychiatry | Admitting: Psychiatry

## 2015-08-23 DIAGNOSIS — R197 Diarrhea, unspecified: Secondary | ICD-10-CM | POA: Diagnosis present

## 2015-08-23 DIAGNOSIS — R000 Tachycardia, unspecified: Secondary | ICD-10-CM | POA: Diagnosis present

## 2015-08-23 DIAGNOSIS — F2081 Schizophreniform disorder: Principal | ICD-10-CM

## 2015-08-23 DIAGNOSIS — E860 Dehydration: Secondary | ICD-10-CM | POA: Diagnosis present

## 2015-08-23 DIAGNOSIS — F29 Unspecified psychosis not due to a substance or known physiological condition: Secondary | ICD-10-CM | POA: Diagnosis not present

## 2015-08-23 DIAGNOSIS — Z720 Tobacco use: Secondary | ICD-10-CM

## 2015-08-23 DIAGNOSIS — F419 Anxiety disorder, unspecified: Secondary | ICD-10-CM | POA: Diagnosis present

## 2015-08-23 DIAGNOSIS — F22 Delusional disorders: Secondary | ICD-10-CM | POA: Diagnosis present

## 2015-08-23 DIAGNOSIS — Z9049 Acquired absence of other specified parts of digestive tract: Secondary | ICD-10-CM | POA: Diagnosis present

## 2015-08-23 DIAGNOSIS — F1220 Cannabis dependence, uncomplicated: Secondary | ICD-10-CM

## 2015-08-23 DIAGNOSIS — F17210 Nicotine dependence, cigarettes, uncomplicated: Secondary | ICD-10-CM | POA: Diagnosis present

## 2015-08-23 DIAGNOSIS — N3001 Acute cystitis with hematuria: Secondary | ICD-10-CM | POA: Diagnosis present

## 2015-08-23 DIAGNOSIS — Z9851 Tubal ligation status: Secondary | ICD-10-CM

## 2015-08-23 DIAGNOSIS — F17200 Nicotine dependence, unspecified, uncomplicated: Secondary | ICD-10-CM

## 2015-08-23 DIAGNOSIS — F062 Psychotic disorder with delusions due to known physiological condition: Secondary | ICD-10-CM

## 2015-08-23 DIAGNOSIS — R451 Restlessness and agitation: Secondary | ICD-10-CM | POA: Diagnosis present

## 2015-08-23 DIAGNOSIS — R52 Pain, unspecified: Secondary | ICD-10-CM

## 2015-08-23 HISTORY — DX: Bipolar disorder, unspecified: F31.9

## 2015-08-23 LAB — URINALYSIS COMPLETE WITH MICROSCOPIC (ARMC ONLY)
BILIRUBIN URINE: NEGATIVE
GLUCOSE, UA: NEGATIVE mg/dL
NITRITE: NEGATIVE
Protein, ur: NEGATIVE mg/dL
SPECIFIC GRAVITY, URINE: 1.02 (ref 1.005–1.030)
pH: 6 (ref 5.0–8.0)

## 2015-08-23 LAB — AMMONIA: AMMONIA: 20 umol/L (ref 9–35)

## 2015-08-23 LAB — TSH: TSH: 2.078 u[IU]/mL (ref 0.350–4.500)

## 2015-08-23 MED ORDER — MAGNESIUM HYDROXIDE 400 MG/5ML PO SUSP: 30.0000 mL | Freq: Every day | ORAL | Status: DC | PRN

## 2015-08-23 MED ORDER — ALUM & MAG HYDROXIDE-SIMETH 200-200-20 MG/5ML PO SUSP: 30.0000 mL | ORAL | Status: DC | PRN

## 2015-08-23 MED ORDER — ACETAMINOPHEN 325 MG PO TABS: 650.0000 mg | ORAL_TABLET | Freq: Four times a day (QID) | ORAL | Status: DC | PRN

## 2015-08-23 MED ORDER — FAMOTIDINE 20 MG PO TABS
20.0000 mg | ORAL_TABLET | Freq: Every day | ORAL | Status: DC
  Filled 2015-08-23 (×2): qty 1

## 2015-08-23 MED ORDER — OLANZAPINE 5 MG PO TBDP: 10.0000 mg | ORAL_TABLET | Freq: Two times a day (BID) | ORAL | Status: DC

## 2015-08-23 MED ORDER — LORAZEPAM 2 MG PO TABS
2.0000 mg | ORAL_TABLET | Freq: Four times a day (QID) | ORAL | Status: DC | PRN
  Administered 2015-09-03 – 2015-09-05 (×3): 2 mg via ORAL
  Filled 2015-08-23 (×5): qty 1

## 2015-08-23 MED ORDER — OLANZAPINE 5 MG PO TBDP
10.0000 mg | ORAL_TABLET | Freq: Every day | ORAL | Status: DC
  Administered 2015-08-23 – 2015-08-26 (×3): 10 mg via ORAL
  Filled 2015-08-23 (×4): qty 2

## 2015-08-23 MED ORDER — OLANZAPINE 5 MG PO TBDP
5.0000 mg | ORAL_TABLET | Freq: Every day | ORAL | Status: DC
  Administered 2015-08-24: 5 mg via ORAL
  Filled 2015-08-23 (×2): qty 1

## 2015-08-23 NOTE — ED Provider Notes (Signed)
-----------------------------------------   6:47 AM on 08/23/2015 -----------------------------------------   Blood pressure 133/94, pulse 63, temperature 98 F (36.7 C), temperature source Oral, resp. rate 18, height 5' (1.524 m), weight 108 lb (48.988 kg), SpO2 100 %.  The patient had no acute events since last update.  Calm and cooperative at this time.  The patient will be admitted to the psych service.     Rebecka Apley, MD 08/23/15 [phone]

## 2015-08-23 NOTE — Progress Notes (Signed)
Pt alert and oriented. Pt admitted from the ED onto the unit. Calm and cooperative. VS stable. Belongings received. Skin assessment completed. Pt searched. No contraband found. Pt mood appropriate to situation.

## 2015-08-23 NOTE — Progress Notes (Signed)
Recreation Therapy Notes  Date: 09.12.16 Time: 3:00 pm Location: Craft Room  Group Topic: Self-expression  Goal Area(s) Addresses:  Patient will identify one color per emotion listed on wheel. Patient will verbalize benefit of using art as a means of self-expression. Patient will verbalize one emotion experienced during session. Patient will be educated on other forms of self-expression.  Behavioral Response: Arrived late, Tearful, Left early  Intervention: Emotion Wheel  Activity: Patients were given a worksheet with 7 different emotions and were instructed to pick a color for each emotion.  Education: LRT educated patient on different forms of self-expression.   Education Outcome: Patient left group before LRT educated patients.  Clinical Observations/Feedback: Patient arrived to group at approximately 3:40 pm. Patient was tearful. LRT explained activity to patient. Patient started working on worksheet. Patient left group at approximately 3:45 pm with PA student. Patient did not return to group.  Jacquelynn Cree, LRT/CTRS 08/23/2015 4:54 PM

## 2015-08-23 NOTE — BHH Counselor (Signed)
Pt. is to be admitted to [facility] by Dr. Guss Bunde. Attending Physician will be Dr. Ardyth Harps.  Pt. has been assigned to room 320, by [facility] Charge Nurse Jerry Caras.  Intake Paper Work has been signed and placed on pt. chart. ER staff Misty Stanley, ER Sect.; Dr. Jannet Mantis, ER MD; Huel Cote Patient's Nurse & Clotilde Dieter L. Patient Access) have been made aware of the admission.
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
## 2015-08-23 NOTE — ED Notes (Signed)
BEHAVIORAL HEALTH ROUNDING Patient sleeping: Yes.   Patient alert and oriented: not applicable SLEEPING Behavior appropriate: Yes.  ; If no, describe: SLEEPING Nutrition and fluids offered: No SLEEPING Toileting and hygiene offered: NoSLEEPING Sitter present: not applicable Law enforcement present: Yes ODS 

## 2015-08-23 NOTE — Tx Team (Signed)
Initial Interdisciplinary Treatment Plan   PATIENT STRESSORS: Financial difficulties Marital or family conflict Occupational concerns   PATIENT STRENGTHS: Ability for insight General fund of knowledge Physical Health   PROBLEM LIST: Problem List/Patient Goals Date to be addressed Date deferred Reason deferred Estimated date of resolution  Bipolar 08/23/15     Occupational concerns 08/23/15     Marital/family conflict 08/23/15                                          DISCHARGE CRITERIA:  Improved stabilization in mood, thinking, and/or behavior Reduction of life-threatening or endangering symptoms to within safe limits Verbal commitment to aftercare and medication compliance  PRELIMINARY DISCHARGE PLAN: Attend PHP/IOP Participate in family therapy Return to previous living arrangement  PATIENT/FAMIILY INVOLVEMENT: This treatment plan has been presented to and reviewed with the patient, Ranette Luckadoo, and/or family member,  .  The patient and family have been given the opportunity to ask questions and make suggestions.  Stann Mainland Parsells 08/23/2015, 3:18 PM

## 2015-08-23 NOTE — ED Notes (Signed)
BEHAVIORAL HEALTH ROUNDING Patient sleeping: Yes.   Patient alert and oriented: not applicable Behavior appropriate: Yes.  ; If no, describe:  Nutrition and fluids offered: Yes  Toileting and hygiene offered: Yes  Sitter present: no Law enforcement present: Yes  

## 2015-08-23 NOTE — ED Notes (Signed)

## 2015-08-24 LAB — VITAMIN B12: VITAMIN B 12: 595 pg/mL (ref 180–914)

## 2015-08-24 NOTE — Progress Notes (Signed)
[facility] MD Progress Note  08/24/2015 2:57 PM Tricia Kerr  MRN:  [id number] Subjective:  Patient was seen this morning she was irritated during the assessment and was uncooperative patient states that she has already been discharged by her husband and she will leave soon. She refuses to comply with a head CT order. She denies SI, HI or hallucinations. She denies problems with appetite, energy or concentration. She denies physical complaints. Denies side effects from medications. Argumentative about her admission says she doesn't understand why were still keeping her in the hospital.  Still reporting to the nurses that she has a fish wire in her chest, and that she is not married but divorced.  Per collateral obtained yesterday from her husband and this is not correct they're still married and live together.  Per nursing: Isolative to room, pleasant on approach, A&Ox3, denied Pain, denied AV/H, denied SI/HI/SIB. When asked her understand of this admission, patient said, "the police brought me here, my ex-husband is cheating on me, yes, we still live together, I didn't know we were divorced, I was mad and wanted to flatten the double wide that we have!" Patient was smiling and laughing as she described the relationship. She has 5 children, 3 baby fathers, oldest age is 6. Mouth checked for hiding medication, she received Zyprexa ODT 10 mg as ordered.   Principal Problem: Psychotic disorder with delusions Diagnosis:   Patient Active Problem List   Diagnosis Date Noted  . Tobacco use disorder [Z72.0] 08/23/2015  . Cannabis use disorder, severe, dependence [F12.20] 08/23/2015  . 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 Anxiety   . Bipolar disorder     Past Surgical History  Procedure Laterality Date  . Appendectomy    . Tubial    . Tubal ligation     Family History:  History reviewed. No pertinent family history. Social History:  History  Alcohol Use  . Yes     History  Drug Use  . 3.00 per week  . Special: Marijuana    Social History   Social History  . Marital Status: Single    Spouse Name: N/A  . Number of Children: N/A  . Years of Education: N/A   Social History Main Topics  . Smoking status: Current Every Day Smoker -- 1.00 packs/day    Types: Cigarettes  . Smokeless tobacco: Never Used  . Alcohol Use: Yes  . Drug Use: 3.00 per week    Special: Marijuana  . Sexual Activity: Not Currently   Other Topics Concern  . None   Social History Narrative   Additional History:    Sleep: Good  Appetite:  Good   Assessment:   Musculoskeletal: Strength & Muscle Tone: within normal limits Gait & Station: normal Patient leans: N/A   Psychiatric Specialty Exam: Physical Exam  Review of Systems  Constitutional: Negative.   HENT: Negative.   Eyes: Negative.   Respiratory: Negative.   Cardiovascular: Negative.   Gastrointestinal: Negative.   Genitourinary: Negative.   Musculoskeletal: Negative.   Skin: Negative.   Neurological: Negative.   Endo/Heme/Allergies: Negative.   Psychiatric/Behavioral: Negative.     Blood pressure 117/78, pulse 72, temperature 98.4 F (36.9 C), temperature source Oral, resp. rate 18, height 5' (1.524 m), weight 48.081 kg (106 lb), SpO2 99 %.Body mass index is 20.7 kg/(m^2).  General Appearance: Fairly Groomed  Patent attorney::  Good  Speech:  Normal Rate  Volume:  Normal  Mood:  Irritable  Affect:  Congruent  Thought Process:  Disorganized  Orientation:  Full (Time, Place, and Person)  Thought Content:  Delusions and Paranoid Ideation  Suicidal Thoughts:  No  Homicidal Thoughts:  No  Memory:  Immediate;   Fair Recent;   Fair Remote;   Fair  Judgement:  Impaired  Insight:  Lacking  Psychomotor Activity:  Normal  Concentration:  NA  Recall:  NA  Fund of Knowledge:Fair  Language: Good   Akathisia:  No  Handed:    AIMS (if indicated):     Assets:  Financial Resources/Insurance Housing Social Support  ADL's:  Intact  Cognition: WNL  Sleep:  Number of Hours: 7.15     Current Medications: Current Facility-Administered Medications  Medication Dose Route Frequency Provider Last Rate Last Dose  . acetaminophen (TYLENOL) tablet 650 mg  650 mg Oral Q6H PRN Beau Fanny, MD      . alum & mag hydroxide-simeth (MAALOX/MYLANTA) 200-200-20 MG/5ML suspension 30 mL  30 mL Oral Q4H PRN Beau Fanny, MD      . famotidine (PEPCID) tablet 20 mg  20 mg Oral Daily Jimmy Footman, MD   20 mg at 08/23/15 1734  . LORazepam (ATIVAN) tablet 2 mg  2 mg Oral Q6H PRN Jimmy Footman, MD      . magnesium hydroxide (MILK OF MAGNESIA) suspension 30 mL  30 mL Oral Daily PRN Beau Fanny, MD      . OLANZapine zydis (ZYPREXA) disintegrating tablet 10 mg  10 mg Oral QHS Jimmy Footman, MD   10 mg at 08/23/15 2209  . OLANZapine zydis (ZYPREXA) disintegrating tablet 5 mg  5 mg Oral Daily Jimmy Footman, MD   5 mg at 08/24/15 1010    Lab Results:  Results for orders placed or performed during the hospital encounter of 08/23/15 (from the past 48 hour(s))  Urinalysis complete, with microscopic ([facility] only)     Status: Abnormal   Collection Time: 08/23/15  5:09 PM  Result Value Ref Range   Color, Urine YELLOW (A) YELLOW   APPearance CLOUDY (A) CLEAR   Glucose, UA NEGATIVE NEGATIVE mg/dL   Bilirubin Urine NEGATIVE NEGATIVE   Ketones, ur TRACE (A) NEGATIVE mg/dL   Specific Gravity, Urine 1.020 1.005 - 1.030   Hgb urine dipstick 1+ (A) NEGATIVE   pH 6.0 5.0 - 8.0   Protein, ur NEGATIVE NEGATIVE mg/dL   Nitrite NEGATIVE NEGATIVE   Leukocytes, UA 3+ (A) NEGATIVE   RBC / HPF 6-30 0 - 5 RBC/hpf   WBC, UA TOO NUMEROUS TO COUNT 0 - 5 WBC/hpf   Bacteria, UA RARE (A) NONE SEEN   Squamous Epithelial / LPF 6-30 (A) NONE SEEN   WBC Clumps PRESENT    Mucous  PRESENT   TSH     Status: None   Collection Time: 08/23/15  7:18 PM  Result Value Ref Range   TSH 2.078 0.350 - 4.500 uIU/mL  Ammonia     Status: None   Collection Time: 08/23/15  7:18 PM  Result Value Ref Range   Ammonia 20 9 - 35 umol/L    Physical Findings: AIMS: Facial and Oral Movements Muscles of Facial Expression: None, normal Lips and Perioral Area: None, normal Jaw: None, normal Tongue: None, normal,Extremity Movements Upper (arms, wrists, hands, fingers): None, normal Lower (legs, knees, ankles, toes): None, normal, Trunk Movements Neck, shoulders, hips: None, normal, Overall Severity Severity of  abnormal movements (highest score from questions above): None, normal Incapacitation due to abnormal movements: None, normal Patient's awareness of abnormal movements (rate only patient's report): Aware, no distress, Dental Status Current problems with teeth and/or dentures?: No Does patient usually wear dentures?: No  CIWA:  CIWA-Ar Total: 6 COWS:     Treatment Plan Summary: Daily contact with patient to assess and evaluate symptoms and progress in treatment and Medication management   29 y/o wf with new onset psychosis. According to the emergency department on the involuntary commitment by her family as she has been very disorganized for the last 5 weeks. Over this past week and the patient was becoming agitated and aggressive.  Unspecified psychotic disorder: not known family history of mental illness, one prior hospitalization when the patient was 29 years old. Per family patient has developed psychotic symptoms over the last couple of months. Family denies history of drug use or abusing prescription medications. Pt has been started on olanzapine zydis 5 mg qam and 10 mg qhs.  Complied last night with medication.  Agitation: I will order Ativan 2 mg by mouth every 6 hours as needed  Tobacco use disorder: Patient states she does not need the gum and claims to be allergic  to the nicotine patch  Labs: TSH and ammonia wnl, B12, RPR, HIV pending.  UA not a clean catch pt denies UTI symptoms.  Imaging: Will order a head CT.  Pt refused yesterday and today  Precautions and every 15 minute checks   Discharge planning: Once stable the patient will be discharged back to her husband house. She will be scheduled to follow-up with [facility].  Medical Decision Making:  New problem, with additional work up planned     Jimmy Footman 08/24/2015, 2:57 PM

## 2015-08-24 NOTE — [facility] Group Notes (Signed)
[facility] Group Notes:  (Nursing/MHT/Case Management/Adjunct)  Date:  08/24/2015  Time:  2:14 PM  Type of Therapy:  Psychoeducational Skills  Participation Level:  Did Not Attend    Mickey Farber 08/24/2015, 2:14 PM

## 2015-08-24 NOTE — Progress Notes (Deleted)
Recreation Therapy Notes  INPATIENT RECREATION THERAPY ASSESSMENT  Patient Details Name: Tricia Kerr MRN: [id number] DOB: [birth year] Today's Date: 08/24/2015  Patient Stressors: Other (Comment) (Being stuck in the hospital)  Coping Skills:   Arguments, Substance Abuse, Exercise, Music, Other (Comment), Sports (Ride)  Personal Challenges: Trusting Others  Leisure Interests (2+):   ("No")  Awareness of Community Resources:  Yes  Community Resources:   (Patient refused to name community resources stating, "You should know this already".)  Current Use:    If no, Barriers?:    Patient Strengths:  "No"  Patient Identified Areas of Improvement:  "No"  Current Recreation Participation:  Nothing  Patient Goal for Hospitalization:  To get out of here  [place] of Residence:  [facility] of Residence:  [place]   Current SI (including self-harm):  No  Current HI:  No  Consent to Intern Participation: N/A  LRT offered to work with patient on trust. Patient refused stating she only worked with Dr. Caralee Ates. Due to patient refusing, LRT will not develop a Recreational Therapy Care Plan at this time. If patient's status changes, LRT will develop a Recreational Therapy Care Plan.  Jacquelynn Cree, LRT/CTRS 08/24/2015, 2:43 PM

## 2015-08-24 NOTE — Plan of Care (Signed)
Problem: Ineffective individual coping Goal: STG: Patient will remain free from self harm Outcome: Progressing Medications administered as ordered by the physician, no PRN given, 15 minute checks maintained for safety, clinical and moral support provided, patient encouraged to continue to express feelings and demonstrate safe care. Patient remain free from harm, will continue to monitor.         

## 2015-08-24 NOTE — Progress Notes (Signed)
Recreation Therapy Notes  Date: 09.13.16 Time: 3:00 pm Location: Craft Room  Group Topic: Goal Setting  Goal Area(s) Addresses:  Patient will write at least one goal. Patient will write at least one obstacle.  Behavioral Response: Did not attend  Intervention: Recovery Goal Chart  Activity: Patients were instructed to write goals, obstacles, the date they started working on their goals, and the date they achieved their goals.  Education: LRT educated patients on healthy ways they can celebrate reaching their goals.   Education Outcome: Patient did not attend group.  Clinical Observations/Feedback: Patient did not attend group.  Jacquelynn Cree, LRT/CTRS 08/24/2015 4:27 PM

## 2015-08-24 NOTE — Progress Notes (Signed)
Recreation Therapy Notes  INPATIENT RECREATION THERAPY ASSESSMENT  Patient Details Name: Tricia Kerr MRN: [id number] DOB: Jul 16, 1986 Today's Date: 08/24/2015  Patient Stressors: Other (Comment) (Being stuck in the hospital)  Coping Skills:   Arguments, Substance Abuse, Exercise, Music, Other (Comment), Sports (Ride)  Personal Challenges: Trusting Others  Leisure Interests (2+):   ("No")  Awareness of Community Resources:  Yes  Community Resources:   (Patient refused to name community resources stating, "You should know this already".)  Current Use:    If no, Barriers?:    Patient Strengths:  "No"  Patient Identified Areas of Improvement:  "No"  Current Recreation Participation:  Nothing  Patient Goal for Hospitalization:  To get out of here  [place] of Residence:  [facility] [place] of Residence:  [place]   Current SI (including self-harm):  No  Current HI:  No  Consent to Intern Participation: N/A  LRT offered to work with patient on trust. Patient refused stating she only worked with Dr. Dareen Piano. Due to patient refusing, LRT will not develop a Recreational Therapy Care Plan at this time. If patient's status changes, LRT will develop a Recreational Therapy Care Plan.  Tricia Kerr, LRT/CTRS 08/24/2015, 5:20 PM

## 2015-08-24 NOTE — Progress Notes (Signed)
Isolative to room, pleasant on approach, A&Ox3, denied Pain, denied AV/H, denied SI/HI/SIB. When asked her understand of this admission, patient said, "the police brought me here, my ex-husband is cheating on me, yes, we still live together, I didn't know we were divorced, I was mad and wanted to flatten the double wide that we have!" Patient was smiling and laughing as she described the relationship. She has 5 children, 3 baby fathers, oldest age is 6. Mouth checked for hiding medication, she received Zyprexa ODT 10 mg as ordered.

## 2015-08-24 NOTE — Progress Notes (Signed)
D: Patient has refused to attend all groups today. Her mood is angry and she denies having any problems or needing to be in the hospital. Angry about having to take medications. Refuses to talk with staff. A: Remains on q 15 minute checks for safety; Given meds; Offered emotional support. R: Angry and irritable.

## 2015-08-24 NOTE — [facility] Group Notes (Signed)
[facility] LCSW Group Therapy  08/24/2015 2:52 PM  Type of Therapy:  Group Therapy  Participation Level:  Did Not Attend  Lulu Riding, MSW, LCSWA 08/24/2015, 2:52 [facility]

## 2015-08-25 ENCOUNTER — Encounter: Payer: Self-pay | Admitting: Radiology

## 2015-08-25 ENCOUNTER — Inpatient Hospital Stay: Payer: Medicaid Other

## 2015-08-25 LAB — COMPREHENSIVE METABOLIC PANEL
ALK PHOS: 87 U/L (ref 38–126)
ALT: 17 U/L (ref 14–54)
AST: 32 U/L (ref 15–41)
Albumin: 4.8 g/dL (ref 3.5–5.0)
Anion gap: 14 (ref 5–15)
BUN: 22 mg/dL — ABNORMAL HIGH (ref 6–20)
CALCIUM: 10.1 mg/dL (ref 8.9–10.3)
CO2: 27 mmol/L (ref 22–32)
CREATININE: 0.88 mg/dL (ref 0.44–1.00)
Chloride: 99 mmol/L — ABNORMAL LOW (ref 101–111)
Glucose, Bld: 170 mg/dL — ABNORMAL HIGH (ref 65–99)
Potassium: 5 mmol/L (ref 3.5–5.1)
SODIUM: 140 mmol/L (ref 135–145)
Total Bilirubin: 1.5 mg/dL — ABNORMAL HIGH (ref 0.3–1.2)
Total Protein: 8.7 g/dL — ABNORMAL HIGH (ref 6.5–8.1)

## 2015-08-25 LAB — CBC
HEMATOCRIT: 48.1 % — AB (ref 35.0–47.0)
HEMOGLOBIN: 16.6 g/dL — AB (ref 12.0–16.0)
MCH: 30.9 pg (ref 26.0–34.0)
MCHC: 34.6 g/dL (ref 32.0–36.0)
MCV: 89.3 fL (ref 80.0–100.0)
Platelets: 305 10*3/uL (ref 150–440)
RBC: 5.38 MIL/uL — AB (ref 3.80–5.20)
RDW: 13.2 % (ref 11.5–14.5)
WBC: 20.3 10*3/uL — ABNORMAL HIGH (ref 3.6–11.0)

## 2015-08-25 LAB — URINALYSIS COMPLETE WITH MICROSCOPIC (ARMC ONLY)
Bilirubin Urine: NEGATIVE
GLUCOSE, UA: 50 mg/dL — AB
NITRITE: NEGATIVE
Protein, ur: 100 mg/dL — AB
SPECIFIC GRAVITY, URINE: 1.028 (ref 1.005–1.030)
pH: 5 (ref 5.0–8.0)

## 2015-08-25 LAB — HIV ANTIBODY (ROUTINE TESTING W REFLEX): HIV SCREEN 4TH GENERATION: NONREACTIVE

## 2015-08-25 LAB — RPR: RPR: NONREACTIVE

## 2015-08-25 LAB — GLUCOSE, CAPILLARY: GLUCOSE-CAPILLARY: 144 mg/dL — AB (ref 65–99)

## 2015-08-25 MED ORDER — LORAZEPAM 0.5 MG PO TABS
0.5000 mg | ORAL_TABLET | Freq: Two times a day (BID) | ORAL | Status: DC
  Administered 2015-08-25 – 2015-09-09 (×30): 0.5 mg via ORAL
  Filled 2015-08-25 (×30): qty 1

## 2015-08-25 MED ORDER — IOHEXOL 240 MG/ML SOLN: 25.0000 mL | INTRAMUSCULAR | Status: AC

## 2015-08-25 MED ORDER — TRAMADOL HCL 50 MG PO TABS
50.0000 mg | ORAL_TABLET | Freq: Four times a day (QID) | ORAL | Status: DC | PRN
  Administered 2015-08-25: 50 mg via ORAL
  Filled 2015-08-25: qty 1

## 2015-08-25 MED ORDER — NITROFURANTOIN MONOHYD MACRO 100 MG PO CAPS
100.0000 mg | ORAL_CAPSULE | Freq: Two times a day (BID) | ORAL | Status: DC
  Administered 2015-08-25: 100 mg via ORAL
  Filled 2015-08-25 (×3): qty 1

## 2015-08-25 MED ORDER — IOHEXOL 300 MG/ML  SOLN
100.0000 mL | Freq: Once | INTRAMUSCULAR | Status: AC | PRN
Start: 2015-08-25 — End: 2015-08-25
  Administered 2015-08-25: 100 mL via INTRAVENOUS

## 2015-08-25 MED ORDER — IBUPROFEN 600 MG PO TABS
600.0000 mg | ORAL_TABLET | Freq: Four times a day (QID) | ORAL | Status: DC | PRN
  Administered 2015-09-02 – 2015-09-04 (×2): 600 mg via ORAL
  Filled 2015-08-25 (×2): qty 1

## 2015-08-25 MED ORDER — TRAMADOL HCL 50 MG PO TABS: 50.0000 mg | ORAL_TABLET | Freq: Two times a day (BID) | ORAL | Status: DC | PRN

## 2015-08-25 NOTE — Progress Notes (Addendum)
Patient is alert and oriented x3, denies SI/HI/AVH, does not endorse depression or anxiety at this time. Patient vomited once this morning over the side of the bed and currently has a pink basin and ice water at the bedside. She is pleasant, however she refused her morning Zyprexa stating that " These are not my medications". When asked if she would like information on Zyprexa she declined. Patient is seclusive to her room and sleeps on and off during the day. She did not leave for groups, breakfast, or lunch.

## 2015-08-25 NOTE — Consult Note (Addendum)
Medical Consultation  Tricia Kerr JXB:[id number] DOB: September 03, 1986 DOA: 08/23/2015 PCP: No PCP Per Patient   Requesting physician: Dr Ardyth Harps Date of consultation: 08/25/2015 Reason for consultation: abdominal pain  Impression/Recommendations 29 year old female admitted to psych with acute onset of "psychosis". Hospitalist called due to abdominal pain.  1. Abdominal pain: I cannot illicit abdominal pain on examination. I can not tell if this is true abdominal pain or from her psychosis as she is stating that she has gold in her abdomen and 8 rib fractures and she is no tenderness or inflammation. I agree with some sort of imaging. CT ABDOMEN has been ordered so we can follow up on the results.  2. Psychosis: This sounds PSYCH in nature as medical reasons i.e B12, TSH, HIV, RPR and NH# are normal. I agree with CT HEAD.  3. Nausea: Unclear etiology as to abdominal pain. I would check a urine pregnancy if not already performed. Follow-up on CT of the abdomen.  4. Tobacco dependence: Patient is encouraged to stop smoking. Patient is counseled for 3 minutes. Patient says "I will not quit smoking"  Chief Complaint: abdominal pain and 8 rib fractures  HPI:  This is a 30 year old female was admitted to the psychiatry unit for acute onset psychosis. Has no services consulted for abdominal pain. Patient reports that she is abdominal pain and she is cold in her abdomen. She also states that he is 8 rib fractures. She says she has dilated veins" in her abdomen as well as the Fish tube in her belly. She did have an episode of nausea twice this morning.  Review of Systems  Cannot obtain a good review of systems except for that stated above. Patient is not very cooperative  Past Medical History  Diagnosis Date  . Anxiety   . Bipolar disorder    Past Surgical History  Procedure Laterality Date  . Appendectomy    . Tubial    . Tubal ligation     Social History:  reports that she has been  smoking Cigarettes.  She has been smoking about 1.00 pack per day. She has never used smokeless tobacco. She reports that she drinks alcohol. She reports that she uses illicit drugs (Marijuana) about 3 times per week.  Allergies  Allergen Reactions  . Nicotine Anaphylaxis    Nicotine patches    Family history: No history of CAD or hypertension  Medications: None   Physical Exam: Blood pressure 136/98, pulse 91, temperature 98.4 F (36.9 C), temperature source Oral, resp. rate 18, height 5' (1.524 m), weight 48.081 kg (106 lb), SpO2 99 %. @ [employer]   08/23/15 1448  Weight: 48.081 kg (106 lb)    Intake/Output Summary (Last 24 hours) at 08/25/15 1439 Last data filed at 08/25/15 1335  Gross per 24 hour  Intake    480 ml  Output      0 ml  Net    480 ml     Constitutional: Appears well-developed and well-nourished. No distress. HENT: Normocephalic. Marland Kitchen Oropharynx is clear and moist.  Eyes: Conjunctivae and EOM are normal. PERRLA, no scleral icterus.  Neck: Normal ROM. Neck supple. No JVD. No tracheal deviation. CVS: RRR, S1/S2 +, no murmurs, no gallops, no carotid bruit.  Pulmonary: Effort and breath sounds normal, no stridor, rhonchi, wheezes, rales.  Abdominal: Soft. BS +,  no distension, tenderness, rebound or guarding.  Musculoskeletal: Normal range of motion. No edema and no tenderness.  Neuro: Alert. CN 2-12 grossly intact.  No focal deficits. Skin: Skin is warm and dry. No rash noted. Psychiatric: She seems to be hallucinating/fabricating   Labs  Basic Metabolic Panel:  Recent Labs Lab 08/21/15 1614  NA 139  K 3.8  CL 106  CO2 24  GLUCOSE 103*  BUN 14  CREATININE 0.75  CALCIUM 9.2   Liver Function Tests:  Recent Labs Lab 08/21/15 1614  AST 28  ALT 22  ALKPHOS 75  BILITOT 1.4*  PROT 7.8  ALBUMIN 4.7   No results for input(s): LIPASE, AMYLASE in the last 168 hours.  CBC:  Recent Labs Lab 08/21/15 1614  WBC 12.1*  HGB 15.3  HCT  46.1  MCV 90.3  PLT 323   Cardiac Enzymes: No results for input(s): CKTOTAL, CKMB, CKMBINDEX, TROPONINI in the last 168 hours. BNP: Invalid input(s): POCBNP CBG: No results for input(s): GLUCAP in the last 168 hours.  Radiological Exams: No results found.    Thank you for allowing me to participate in the care of your patient. We will continue to follow.   Time spent: 45  Niklaus Mamaril, MD

## 2015-08-25 NOTE — Progress Notes (Signed)
Recreation Therapy Notes  Date: 09.14.16 Time: 3:15 pm Location: Craft Room  Group Topic: Self-esteem, coping skills  Goal Area(s) Addresses:  Patient will identify at least one positive trait about self. Patient will identify at least one healthy coping skill.  Behavioral Response: Did not attend  Intervention: All About Me  Activity: Patients were instructed to make an All About Me pamphlet listing their life's motto, positive traits, healthy coping skills, and their healthy support system.  Education: LRT educated patients on ways they can increase their self-esteem.  Education Outcome: Patient did not attend group.  Clinical Observations/Feedback: Patient did not attend group.  Jacquelynn Cree, LRT/CTRS 08/25/2015 4:42 PM

## 2015-08-25 NOTE — [facility] Group Notes (Signed)
[facility] Group Notes:  (Nursing/MHT/Case Management/Adjunct)  Date:  08/25/2015  Time:  12:29 PM  Type of Therapy:  Group Therapy  Participation Level:  Did Not Attend  Participation Quality:  pt sick  Affect:  Summary of Progress/Problems:  Tricia Kerr 08/25/2015, 12:29 PM

## 2015-08-25 NOTE — Progress Notes (Signed)
In room withdrawn from peers, A&OX3, bed time medications re-offerred, patient declined "I just want to go home..." Denied self harm, denied SI/HI, denied any form of Hallucinations

## 2015-08-25 NOTE — Plan of Care (Signed)
Problem: Alteration in mood Goal: LTG-Pt's behavior demonstrates decreased signs of depression (Patient's behavior demonstrates decreased signs of depression to the point the patient is safe to return home and continue treatment in an outpatient setting)  Outcome: Progressing Patient denies depression at this time stating she does not feel "sad or down".

## 2015-08-25 NOTE — Progress Notes (Signed)
Patient went off the floor to CT scan without drinking contrast dye with emesis basin due to nausea.

## 2015-08-25 NOTE — [facility] Group Notes (Signed)
[facility] LCSW Group Therapy  08/25/2015 5:02 PM  Type of Therapy:  Group Therapy  Participation Level:  Did Not Attend   Lulu Riding, MSW, LCSWA 08/25/2015, 5:02 PM

## 2015-08-25 NOTE — Progress Notes (Addendum)
[facility] MD Progress Note  08/25/2015 5:44 PM Tricia Kerr  MRN:  [id number] Subjective:  Patient continues to voice bizarre delusions. Theme  of her delusions are various. Appears to have some somatic delusions as she has been saying she has some cooper her in her chest.  There are also some delusions of jealousy and she believes her husband has been cheating on her.  There is also paranoid ideation as she believes we have the wrong information on her, run name and all medications.     She denies SI, HI or hallucinations. She denies problems with appetite, energy or concentration. She denies physical complaints. Denies side effects from medications. Argumentative about her admission says she doesn't understand why were still keeping her in the hospital.    Per nursing: In room withdrawn from peers, A&OX3, bed time medications re-offerred, patient declined "I just want to go home..." Denied self harm, denied SI/HI, denied any form of Hallucinations.    Medication compliance: pt started refusing med last night.  "this are not my medications"   This afternoon pt started c/o severe abdominal pain but her explanation of the cause of the pain was again bizarre.  Pt says her ribs are fractured (not congruent with examination).  Per nursing she had 2 episodes of vomiting.  Reported that her pain was severe. She  was seeing stating on the hallway hyperventilating and unable to walk without holding from the rails.  She had to be assisted back to her bed. All VS were wnl.  Examination only showed tenderness on the left upper quadrant.  Abd was soft and not distended.  No pain around the chest wall.  Hospitalist evaluated pt.  Principal Problem: Psychotic disorder with delusions Diagnosis:   Patient Active Problem List   Diagnosis Date Noted  . Tobacco use disorder [Z72.0] 08/23/2015  . Cannabis use disorder, severe, dependence [F12.20] 08/23/2015  . 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 Anxiety   . Bipolar disorder     Past Surgical History  Procedure Laterality Date  . Appendectomy    . Tubial    . Tubal ligation     Family History: History reviewed. No pertinent family history. Social History:  History  Alcohol Use  . Yes     History  Drug Use  . 3.00 per week  . Special: Marijuana    Social History   Social History  . Marital Status: Single    Spouse Name: N/A  . Number of Children: N/A  . Years of Education: N/A   Social History Main Topics  . Smoking status: Current Every Day Smoker -- 1.00 packs/day    Types: Cigarettes  . Smokeless tobacco: Never Used  . Alcohol Use: Yes  . Drug Use: 3.00 per week    Special: Marijuana  . Sexual Activity: Not Currently   Other Topics Concern  . None   Social History Narrative   Additional History:    Sleep: Good  Appetite:  Good   Assessment:   Musculoskeletal: Strength & Muscle Tone: within normal limits Gait & Station: normal Patient leans: N/A   Psychiatric Specialty Exam: Physical Exam  Constitutional: She is oriented to person, place, and time. She appears well-developed and well-nourished. She appears distressed.  HENT:  Head: Normocephalic and atraumatic.  Cardiovascular: Normal rate, regular rhythm and normal heart sounds.   Respiratory:  Breath sounds normal.  GI: She exhibits no distension and no mass. There is tenderness. There is no rebound and no guarding.  Neurological: She is alert and oriented to person, place, and time.  Skin: Skin is warm and dry. She is not diaphoretic.    Review of Systems  HENT: Negative.   Eyes: Negative.   Respiratory: Positive for shortness of breath.   Cardiovascular: Positive for palpitations.  Gastrointestinal: Positive for vomiting and abdominal pain.  Genitourinary: Negative.   Musculoskeletal: Negative.   Skin: Negative.    Neurological: Positive for weakness.  Endo/Heme/Allergies: Negative.   Psychiatric/Behavioral: Negative.     Blood pressure 136/98, pulse 91, temperature 98.4 F (36.9 C), temperature source Oral, resp. rate 18, height 5' (1.524 m), weight 48.081 kg (106 lb), SpO2 99 %.Body mass index is 20.7 kg/(m^2).  General Appearance: Fairly Groomed  Engineer, water::  Good  Speech:  Normal Rate  Volume:  Normal  Mood:  Irritable  Affect:  Congruent  Thought Process:  Disorganized  Orientation:  Full (Time, Place, and Person)  Thought Content:  Delusions and Paranoid Ideation  Suicidal Thoughts:  No  Homicidal Thoughts:  No  Memory:  Immediate;   Fair Recent;   Fair Remote;   Fair  Judgement:  Impaired  Insight:  Lacking  Psychomotor Activity:  Normal  Concentration:  NA  Recall:  NA  Fund of Knowledge:Fair  Language: Good  Akathisia:  No  Handed:    AIMS (if indicated):     Assets:  Financial Resources/Insurance Housing Social Support  ADL's:  Intact  Cognition: WNL  Sleep:  Number of Hours: 7.15     Current Medications: Current Facility-Administered Medications  Medication Dose Route Frequency Provider Last Rate Last Dose  . acetaminophen (TYLENOL) tablet 650 mg  650 mg Oral Q6H PRN Dewain Penning, MD      . alum & mag hydroxide-simeth (MAALOX/MYLANTA) 200-200-20 MG/5ML suspension 30 mL  30 mL Oral Q4H PRN Dewain Penning, MD      . ibuprofen (ADVIL,MOTRIN) tablet 600 mg  600 mg Oral Q6H PRN Hildred Priest, MD      . LORazepam (ATIVAN) tablet 0.5 mg  0.5 mg Oral BID Hildred Priest, MD   0.5 mg at 08/25/15 1452  . LORazepam (ATIVAN) tablet 2 mg  2 mg Oral Q6H PRN Hildred Priest, MD   2 mg at 08/25/15 1449  . magnesium hydroxide (MILK OF MAGNESIA) suspension 30 mL  30 mL Oral Daily PRN Dewain Penning, MD      . nitrofurantoin (macrocrystal-monohydrate) (MACROBID) capsule 100 mg  100 mg Oral Q12H Hildred Priest, MD      . OLANZapine zydis  (ZYPREXA) disintegrating tablet 10 mg  10 mg Oral QHS Hildred Priest, MD   10 mg at 08/23/15 2209  . OLANZapine zydis (ZYPREXA) disintegrating tablet 5 mg  5 mg Oral Daily Hildred Priest, MD   5 mg at 08/24/15 1010  . traMADol (ULTRAM) tablet 50 mg  50 mg Oral Q12H PRN Hildred Priest, MD        Lab Results:  Results for orders placed or performed during the hospital encounter of 08/23/15 (from the past 48 hour(s))  TSH     Status: None   Collection Time: 08/23/15  7:18 PM  Result Value Ref Range   TSH 2.078 0.350 - 4.500 uIU/mL  Vitamin B12     Status: None   Collection Time: 08/23/15  7:18 PM  Result Value Ref Range  Vitamin B-12 595 180 - 914 pg/mL    Comment: (NOTE) This assay is not validated for testing neonatal or myeloproliferative syndrome specimens for Vitamin B12 levels. Performed at [facility]   HIV antibody     Status: None   Collection Time: 08/23/15  7:18 PM  Result Value Ref Range   HIV Screen 4th Generation wRfx Non Reactive Non Reactive    Comment: (NOTE) Performed At: [facility] [place], [place] [id number] Lindon Romp MD IW:[id number]   RPR     Status: None   Collection Time: 08/23/15  7:18 PM  Result Value Ref Range   RPR Ser Ql Non Reactive Non Reactive    Comment: (NOTE) Performed At: [facility] [place], [place] [id number] Lindon Romp MD AL:[id number]   Ammonia     Status: None   Collection Time: 08/23/15  7:18 PM  Result Value Ref Range   Ammonia 20 9 - 35 umol/L  Urinalysis complete, with microscopic ([facility] only)     Status: Abnormal   Collection Time: 08/25/15  2:24 PM  Result Value Ref Range   Color, Urine YELLOW (A) YELLOW   APPearance CLOUDY (A) CLEAR   Glucose, UA 50 (A) NEGATIVE mg/dL   Bilirubin Urine NEGATIVE NEGATIVE   Ketones, ur 2+ (A) NEGATIVE mg/dL   Specific Gravity, Urine 1.028 1.005 - 1.030   Hgb urine dipstick 1+ (A) NEGATIVE    pH 5.0 5.0 - 8.0   Protein, ur 100 (A) NEGATIVE mg/dL   Nitrite NEGATIVE NEGATIVE   Leukocytes, UA 1+ (A) NEGATIVE   RBC / HPF 6-30 0 - 5 RBC/hpf   WBC, UA TOO NUMEROUS TO COUNT 0 - 5 WBC/hpf   Bacteria, UA RARE (A) NONE SEEN   Squamous Epithelial / LPF 6-30 (A) NONE SEEN   Mucous PRESENT   CBC     Status: Abnormal   Collection Time: 08/25/15  2:31 PM  Result Value Ref Range   WBC 20.3 (H) 3.6 - 11.0 K/uL   RBC 5.38 (H) 3.80 - 5.20 MIL/uL   Hemoglobin 16.6 (H) 12.0 - 16.0 g/dL   HCT 48.1 (H) 35.0 - 47.0 %   MCV 89.3 80.0 - 100.0 fL   MCH 30.9 26.0 - 34.0 pg   MCHC 34.6 32.0 - 36.0 g/dL   RDW 13.2 11.5 - 14.5 %   Platelets 305 150 - 440 K/uL  Comprehensive metabolic panel     Status: Abnormal   Collection Time: 08/25/15  2:31 PM  Result Value Ref Range   Sodium 140 135 - 145 mmol/L   Potassium 5.0 3.5 - 5.1 mmol/L   Chloride 99 (L) 101 - 111 mmol/L   CO2 27 22 - 32 mmol/L   Glucose, Bld 170 (H) 65 - 99 mg/dL   BUN 22 (H) 6 - 20 mg/dL   Creatinine, Ser 0.88 0.44 - 1.00 mg/dL   Calcium 10.1 8.9 - 10.3 mg/dL   Total Protein 8.7 (H) 6.5 - 8.1 g/dL   Albumin 4.8 3.5 - 5.0 g/dL   AST 32 15 - 41 U/L   ALT 17 14 - 54 U/L   Alkaline Phosphatase 87 38 - 126 U/L   Total Bilirubin 1.5 (H) 0.3 - 1.2 mg/dL   GFR calc non Af Amer >60 >60 mL/min   GFR calc Af Amer >60 >60 mL/min    Comment: (NOTE) The eGFR has been calculated using the CKD EPI equation. This calculation has  not been validated in all clinical situations. eGFR's persistently <60 mL/min signify possible Chronic Kidney Disease.    Anion gap 14 5 - 15  Glucose, capillary     Status: Abnormal   Collection Time: 08/25/15  5:05 PM  Result Value Ref Range   Glucose-Capillary 144 (H) 65 - 99 mg/dL   Comment 1 Notify RN     Physical Findings: AIMS: Facial and Oral Movements Muscles of Facial Expression: None, normal Lips and Perioral Area: None, normal Jaw: None, normal Tongue: None, normal,Extremity  Movements Upper (arms, wrists, hands, fingers): None, normal Lower (legs, knees, ankles, toes): None, normal, Trunk Movements Neck, shoulders, hips: None, normal, Overall Severity Severity of abnormal movements (highest score from questions above): None, normal Incapacitation due to abnormal movements: None, normal Patient's awareness of abnormal movements (rate only patient's report): Aware, no distress, Dental Status Current problems with teeth and/or dentures?: No Does patient usually wear dentures?: No  CIWA:  CIWA-Ar Total: 6 COWS:     Treatment Plan Summary: Daily contact with patient to assess and evaluate symptoms and progress in treatment and Medication management   29 y/o wf with new onset psychosis. According to the emergency department on the involuntary commitment by her family as she has been very disorganized for the last 5 weeks. Over this past week and the patient was becoming agitated and aggressive.  Unspecified psychotic disorder: not known family history of mental illness, one prior hospitalization when the patient was 29 years old. Per family patient has developed psychotic symptoms over the last couple of months. Family denies history of drug use or abusing prescription medications. Pt has been started on olanzapine zydis 5 mg qam and 10 mg qhs.  Complied on admission but started refusing it last night.  If she continues to refuse will start forced meds tomorrow.  Agitation: I will order Ativan 2 mg by mouth every 6 hours as needed  Anxiety: will start ativan 0.5 mg po bid  Tobacco use disorder: Patient states she does not need the gum and claims to be allergic to the nicotine patch  Vomiting/abdominal pain/weakness: appears all was secondary to UTI.  Abd and pelvis CT wnl. UA suggest UTI and WBC elevated.  No fever and other VS were wnl.  Seen by hospitalist   UTI: will start macrobid 100 mg po bid for 7 days.  Ibuprofen for moderate pain and tramadol q 12 for  severe pain.  Labs: TSH and ammonia wnl, B12 wnl, RPR -, HIV -.  Labs repeated today CBC WBC elevated, CMP wnl. UA suggestive of UTI.  Pending culture.  Imaging: completed head CT which was wnl.  Abd-pelvis CT was wnl--possible infection.  Precautions and every 15 minute checks   Discharge planning: Once stable the patient will be discharged back to her husband house. She will be scheduled to follow-up with [facility].  Medical Decision Making:  New problem, with additional work up planned     Hildred Priest 08/25/2015, 5:44 PM

## 2015-08-25 NOTE — Progress Notes (Signed)
Patient increasingly alert and responsive with interactions. Patient refuses Ativan 0.5 mg po scheduled at this time stating " she has taken it before and it makes her heart race". Patient takes Tramadol 50 mg po prn at this time. Patient unable to drink CT scan contrast at this time, stating " I am not going to do that".

## 2015-08-25 NOTE — Progress Notes (Signed)
Patient noted to be walking in hall, holding to handrail, weak and complaining of discomfort to right rib area. Patient assisted back to bed, states it hurts when she breathes. MD at side and vital signs (dynamap) 164/100, 64, 16, 98.7. POx 100 % RA, skin warm and dry. Patient weak and mumbling. Mumbling responses and oriented x 3, confusion noted. MD orders Medical consult, CT scan, urine culture (obtained with results pending), CBC and Tramadol prn for pain. Patient manual BP 100/60. Medical MD into assess and evaluate. Evaluation complete and patient vomits clear green emesis at this time.

## 2015-08-25 NOTE — Plan of Care (Signed)
Problem: Ineffective individual coping Goal: STG: Patient will remain free from self harm Outcome: Progressing Medications administered as ordered by the physician, no PRN given, 15 minute checks maintained for safety, clinical and moral support provided, patient encouraged to continue to express feelings and demonstrate safe care. Patient remain free from harm, will continue to monitor.         

## 2015-08-26 MED ORDER — CEFTRIAXONE SODIUM 250 MG IJ SOLR
250.0000 mg | INTRAMUSCULAR | Status: AC
  Administered 2015-08-26 – 2015-08-28 (×3): 250 mg via INTRAMUSCULAR
  Filled 2015-08-26 (×3): qty 250

## 2015-08-26 MED ORDER — CEFTRIAXONE SODIUM 1 G IJ SOLR: 1.0000 g | INTRAMUSCULAR | Status: DC

## 2015-08-26 MED ORDER — OLANZAPINE 10 MG IM SOLR
5.0000 mg | Freq: Every morning | INTRAMUSCULAR | Status: DC
  Administered 2015-08-26: 5 mg via INTRAMUSCULAR
  Filled 2015-08-26 (×2): qty 10

## 2015-08-26 MED ORDER — CEFTRIAXONE SODIUM 250 MG IJ SOLR: 250.0000 mg | INTRAMUSCULAR | Status: DC

## 2015-08-26 MED ORDER — ONDANSETRON 8 MG PO TBDP
8.0000 mg | ORAL_TABLET | Freq: Three times a day (TID) | ORAL | Status: DC | PRN
  Administered 2015-08-26 – 2015-09-02 (×5): 8 mg via ORAL
  Filled 2015-08-26 (×7): qty 1

## 2015-08-26 MED ORDER — OLANZAPINE 10 MG IM SOLR
10.0000 mg | Freq: Every day | INTRAMUSCULAR | Status: DC
  Filled 2015-08-26 (×2): qty 10

## 2015-08-26 MED ORDER — SODIUM CHLORIDE 0.9 % IJ SOLN
INTRAMUSCULAR | Status: AC
  Administered 2015-08-26: 1 mL
  Filled 2015-08-26: qty 3

## 2015-08-26 NOTE — Plan of Care (Signed)
Problem: Alteration in mood; excessive anxiety as evidenced by: Goal: LTG-Patient's behavior demonstrates decreased anxiety (Patient's behavior demonstrates anxiety and he/she is utilizing learned coping skills to deal with anxiety-producing situations)  Outcome: Progressing Patient is using coping skills to deal with anxiety.

## 2015-08-26 NOTE — Progress Notes (Signed)
Recreation Therapy Notes  Date: 09.15.16 Time: 3:00 pm Location: Craft Room  Group Topic: Leisure Education, Coping skills  Goal Area(s) Addresses:  Patient will identify things they are grateful. Patient will identify how being grateful can influence decision making.  Behavioral Response: Did not attend  Intervention: Grateful Wheel  Activity: Patients were given a I Am Grateful For worksheet and instructed to write at least one thing they are grateful for under each category.  Education: LRT educated patients on ways they can integrate leisure into their schedules.  Education Outcome: Patient did not attend group.  Clinical Observations/Feedback: Patient did not attend group.  Jacquelynn Cree, LRT/CTRS 08/26/2015 4:18 PM

## 2015-08-26 NOTE — Progress Notes (Signed)
D- Patient denies SI/HI/AVH and is oriented x 3. Continues to show signs of delusional thoughts evidenced by statements like, " I need to take my antibiotic to get the Adderol out of my system" and " I need to go downstairs to meet the [facility] OR to get the diamonds out of my stomach".  Patient began the morning with bright affect and fixed smile on her face that was incongruent with thought content and then became more irritable and delusional.She initially refused Zyprexa but agreed to take it IM in the afternoon and was tearful while it was administered. Patient has refused meals because she says that , " Everything that isn't a clear liquid makes me throw up". She also endorses left rib pain as a 7/10.  A- Patient given PRN Zofran for nausea and encouraged to drink fluids and to be compliant with medications and treatment. Patient was offered pain medication and refused.  R- Patient accepted Zofran PO and Zyprexa IM, maintains safety on the unit.

## 2015-08-26 NOTE — Progress Notes (Signed)
Unable to complete patient assessment due to bizarre and unusual thoughts.

## 2015-08-26 NOTE — Progress Notes (Signed)
Patient given Zyprexa and Ceftriaxone IM, and has slept all afternoon. Refused water and food.

## 2015-08-26 NOTE — [facility] Group Notes (Signed)
[facility] LCSW Group Therapy  08/26/2015 12:40 PM  Type of Therapy:  Group Therapy  Participation Level:  Did Not Attend  Participation Quality:    Affect:    Cognitive:    Insight:    Engagement in Therapy:    Modes of Intervention:    Summary of Progress/Problems::Tricia Kerr 08/26/2015, 12:40 PM

## 2015-08-26 NOTE — Progress Notes (Signed)
D: Patient denies SI/HI/AVH, Patient is pleasant and cooperative. Patient stated she feels better from taking her medication, she appears less anxious and is interacting with peers and staff appropriately.  A: Pt was offered support and encouragement. Pt was given scheduled medications. Pt was encouraged to attend groups. Q 15 minute checks were done for safety.  R: Pt attends groups and interacts well with peers and staff. Pt is taking medication. Patient is receptive to treatment and safety maintained on unit.

## 2015-08-26 NOTE — Tx Team (Signed)
Interdisciplinary Treatment Plan Update (Adult)  Date:  08/26/2015 Time Reviewed:  4:14 PM  Progress in Treatment: Attending groups: no Participating in groups:  no Taking medication as prescribed:  no Tolerating medication:  Refuses medication Family/Significant othe contact made:  unknown Patient understands diagnosis:  Unknown patiient non verbal /not cooperating Discussing patient identified problems/goals with staff:  yes Medical problems stabilized or resolved:  no Denies suicidal/homicidal ideation: unknown Issues/concerns per patient self-inventory:  TBD Other:  New problem(s) identified: Patient may require forced medications  Discharge Plan or Barriers:   Reason for Continuation of Hospitalization: Delusions  Medication stabilization Other; describe Non medication compliance  Comments:  Estimated length of stay: 7 days  New goal(s):  Review of initial/current patient goals per problem list:   Refer to plan of care  Attendees: Patient:  Tricia Kerr 9/15/20164:14 PM  Family:   9/15/20164:14 PM  Physician:  Dr Ardyth Harps 9/15/20164:14 PM  Nursing:   Philis Allison Quarry 9/15/20164:14 PM  Case Manager:  Arrie Senate LCSW 9/15/20164:14 PM  Counselor:  Princella Ion LRT 9/15/20164:14 PM  Other:   9/15/20164:14 PM  Other:   9/15/20164:14 PM  Other:   9/15/20164:14 PM  Other:  9/15/20164:14 PM  Other:  9/15/20164:14 PM  Other:  9/15/20164:14 PM  Other:  9/15/20164:14 PM  Other:  9/15/20164:14 PM  Other:  9/15/20164:14 PM  Other:   9/15/20164:14 PM   Scribe for Treatment Team:   Cheron Schaumann, 08/26/2015, 4:14 PM

## 2015-08-26 NOTE — Progress Notes (Signed)
Patient ID: Tricia Kerr, female   DOB: Mar 14, 1986, 29 y.o.   MRN: [id number] [facility] PROGRESS NOTE  PCP: No PCP Per Patient  HPI/Subjective: Patient awakened from rest. Patient will confused about events from yesterday. She did vomit a few times. She states that she has gold and diamonds in her stomach and that's the reason why she can't eat. Patient asking when she can go home.  Objective: Filed Vitals:   08/26/15 0745  BP:   Pulse: 80  Temp:   Resp:     Filed Weights   08/23/15 1448  Weight: 48.081 kg (106 lb)    ROS: Review of Systems  Constitutional: Negative for fever and chills.  Eyes: Negative for blurred vision.  Respiratory: Positive for cough. Negative for shortness of breath.   Cardiovascular: Positive for chest pain.  Gastrointestinal: Positive for nausea, vomiting and abdominal pain. Negative for diarrhea and constipation.  Genitourinary: Negative for dysuria.  Musculoskeletal: Negative for joint pain.  Neurological: Negative for dizziness and headaches.   Exam: Physical Exam  HENT:  Nose: No mucosal edema.  Mouth/Throat: No oropharyngeal exudate or posterior oropharyngeal edema.  Eyes: Conjunctivae, EOM and lids are normal. Pupils are equal, round, and reactive to light.  Neck: No JVD present. Carotid bruit is not present. No edema present. No thyroid mass and no thyromegaly present.  Cardiovascular: S1 normal and S2 normal.  Tachycardia present.  Exam reveals no gallop.   No murmur heard. Pulses:      Dorsalis pedis pulses are 2+ on the right side, and 2+ on the left side.  Respiratory: No respiratory distress. She has no wheezes. She has no rhonchi. She has no rales.  GI: Soft. Bowel sounds are normal. There is no tenderness.  Musculoskeletal:       Right ankle: She exhibits no swelling.       Left ankle: She exhibits no swelling.  Lymphadenopathy:    She has no cervical adenopathy.  Neurological: She is alert. No cranial nerve  deficit.  Skin: Skin is warm. No rash noted. Nails show no clubbing.  Psychiatric: She has a normal mood and affect. Thought content is delusional.    Data Reviewed: Basic Metabolic Panel:  Recent Labs Lab 08/21/15 1614 08/25/15 1431  NA 139 140  K 3.8 5.0  CL 106 99*  CO2 24 27  GLUCOSE 103* 170*  BUN 14 22*  CREATININE 0.75 0.88  CALCIUM 9.2 10.1   Liver Function Tests:  Recent Labs Lab 08/21/15 1614 08/25/15 1431  AST 28 32  ALT 22 17  ALKPHOS 75 87  BILITOT 1.4* 1.5*  PROT 7.8 8.7*  ALBUMIN 4.7 4.8    Recent Labs Lab 08/23/15 1918  AMMONIA 20   CBC:  Recent Labs Lab 08/21/15 1614 08/25/15 1431  WBC 12.1* 20.3*  HGB 15.3 16.6*  HCT 46.1 48.1*  MCV 90.3 89.3  PLT 323 305    CBG:  Recent Labs Lab 08/25/15 1705  GLUCAP 144*    Recent Results (from the past 240 hour(s))  Urine culture     Status: None (Preliminary result)   Collection Time: 08/25/15  2:24 PM  Result Value Ref Range Status   Specimen Description URINE, CLEAN CATCH  Final   Special Requests Normal  Final   Culture TOO YOUNG TO READ  Final   Report Status PENDING  Incomplete     Studies: Ct Head W Wo Contrast  08/25/2015   CLINICAL DATA:  Acute psychosis.  EXAM:  CT HEAD WITHOUT AND WITH CONTRAST  TECHNIQUE: Contiguous axial images were obtained from the base of the skull through the vertex without and with intravenous contrast  CONTRAST:  OMNIPAQUE IOHEXOL 300 MG/ML  SOLN  COMPARISON:  None.  FINDINGS: Suboptimal evaluation at the level of the skullbase due to artifact. There is no evidence of acute cortical infarct, intracranial hemorrhage, mass, midline shift, or extra-axial fluid collection. Ventricles and sulci are normal. No abnormal enhancement is identified.  Mild posterior right ethmoid air cell mucosal thickening is partially visualized. Mastoid air cells are clear. No skull fracture. Visualized orbits are unremarkable.  IMPRESSION: No evidence of acute intracranial  abnormality.   Electronically Signed   By: Sebastian Ache M.D.   On: 08/25/2015 17:07   Ct Abdomen Pelvis W Contrast  08/25/2015   CLINICAL DATA:  CT Head WWO and CT Abdomen Pelvis combo. Pt refused oral contrast.29 year old female admitted to psych with acute onset of "psychosis". Hospitalist called due to abdominal pain.  MD notes are as follows:1. Abdominal pain: I cannot illicit abdominal pain on examination. I can not tell if this is true abdominal pain or from her psychosis as she is stating that she has gold in her abdomen and 8 rib fractures and she is no tenderness or inflammation.I agree with some sort of imaging. CT ABDOMEN has been ordered so we can follow up on the results.2. Psychosis: This sounds PSYCH in nature as medical reasons i.e B12, TSH, HIV, RPR and NH# are normal. I agree with CT HEAD.3. Nausea: Unclear etiology as to abdominal pain. I would check a urine pregnancy if not already performed. Follow-up on CT of the abdomen.4. Tobacco dependence: Patient is encouraged to stop smoking. Patient is counseled for 3 minutes. Patient says "I will not quit smoking"Chief Complaint: abdominal pain and 8 rib fractures  EXAM: CT ABDOMEN AND PELVIS WITH CONTRAST  TECHNIQUE: Multidetector CT imaging of the abdomen and pelvis was performed using the standard protocol following bolus administration of intravenous contrast.  CONTRAST:  OMNIPAQUE IOHEXOL 300 MG/ML  SOLN  COMPARISON:  09/12/2014  FINDINGS: Lung bases:  Clear.  Heart normal size.  Liver, spleen, gallbladder, pancreas, adrenal glands:  Normal.  Kidneys, ureters, bladder: Normal kidneys in ureters. Bladder is only mildly distended. Wall appears thickened. No bladder mass or stone.  Uterus and adnexa:  Unremarkable.  Lymph nodes:  No adenopathy.  Ascites:  None.  Gastrointestinal: Unremarkable. Appendix not visualized. No evidence of appendicitis.  Musculoskeletal:  Unremarkable.  IMPRESSION: 1. Bladder wall appears thickened. This may reflect  an infectious or inflammatory cystitis. 2. No other evidence of an acute abnormality. Exam otherwise normal.   Electronically Signed   By: Amie Portland M.D.   On: 08/25/2015 17:00    Scheduled Meds: . cefTRIAXone (ROCEPHIN) IM  250 mg Intramuscular Q24H  . LORazepam  0.5 mg Oral BID  . OLANZapine  10 mg Intramuscular QHS  . OLANZapine  5 mg Intramuscular q morning - 10a  . OLANZapine zydis  10 mg Oral QHS  . OLANZapine zydis  5 mg Oral Daily   Continuous Infusions:   Assessment/Plan:  1. Abdominal pain, nausea vomiting. Unclear if this is real or not. CT scan of the abdomen and pelvis was negative. When necessary nausea medication. 2. Acute cystitis with hematuria- patient not taking oral medication. I was switched to IM Rocephin. 3. Leukocytosis- could be secondary to nausea vomiting or the acute cystitis. Continue to monitor. 4. Tachycardia and  dehydration.- Patient encouraged to eat and drink. 5. Psychiatry treating psychosis with olanzapine.  Code Status:     Code Status Orders        Start     Ordered   08/23/15 1523  Full code   Continuous     08/23/15 1522    Advance Directive Documentation        Most Recent Value   Type of Advance Directive  -- [N/A]   Pre-existing out of facility DNR order (yellow form or pink MOST form)     "MOST" Form in Place?       Disposition Plan: Up to psychiatry.  Time spent: 25 minutes, patient examined with the nursing staff.  Alford Highland  [facility]

## 2015-08-26 NOTE — Progress Notes (Signed)
D: Patient is alert with periods of confusion to place and situation, denies SI/HI/AVH, but appears to be responding to internal stimuli. Patient has a fixed false belief that she has diamonds in her abdomen, patient was re orriented that she does not have diamond in her abdomen. Patient's affect is flat, sad and depressed, she appears anxious, and she  is not interacting with peers and staff appropriately.  A: Pt was offered support and encouragement. Pt was given scheduled medications. Pt was encouraged to attend groups. Q 15 minute checks were done for safety.  R: Patient did not attend evening group, she isolated to her room. Pt is compliant with medication. Pt receptive to treatment and safety maintained on unit.
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
## 2015-08-26 NOTE — Plan of Care (Signed)
Problem: Alteration in mood Goal: LTG-Pt's behavior demonstrates decreased signs of depression (Patient's behavior demonstrates decreased signs of depression to the point the patient is safe to return home and continue treatment in an outpatient setting)  Outcome: Progressing Patient states that she is not depressed at this time and maintains a bright affect.

## 2015-08-27 LAB — URINE CULTURE: Special Requests: NORMAL

## 2015-08-27 MED ORDER — OLANZAPINE 10 MG IM SOLR
10.0000 mg | Freq: Two times a day (BID) | INTRAMUSCULAR | Status: DC
  Administered 2015-08-31: 10 mg via INTRAMUSCULAR
  Filled 2015-08-27 (×12): qty 10

## 2015-08-27 MED ORDER — SODIUM CHLORIDE 0.9 % IJ SOLN
INTRAMUSCULAR | Status: AC
  Administered 2015-08-27: 17:00:00
  Filled 2015-08-27: qty 3

## 2015-08-27 MED ORDER — OLANZAPINE 5 MG PO TBDP
10.0000 mg | ORAL_TABLET | Freq: Two times a day (BID) | ORAL | Status: DC
  Administered 2015-08-27 – 2015-08-29 (×6): 10 mg via ORAL
  Filled 2015-08-27: qty 2
  Filled 2015-08-27: qty 1
  Filled 2015-08-27 (×5): qty 2

## 2015-08-27 NOTE — [facility] Group Notes (Signed)
[facility] Group Notes:  (Nursing/MHT/Case Management/Adjunct)  Date:  08/27/2015  Time:  11:56 AM  Type of Therapy:  Psychoeducational Skills  Participation Level:  Did Not Attend    Tricia Kerr 08/27/2015, 11:56 AM

## 2015-08-27 NOTE — [facility] Group Notes (Signed)
[facility] LCSW Aftercare Discharge Planning Group Note   08/27/2015 1:09 PM  Participation Quality: Did not attend.   Candace L Hyatt, MSW, LCSWA  

## 2015-08-27 NOTE — Plan of Care (Signed)
Problem: Ineffective individual coping Goal: LTG: Patient will report a decrease in negative feelings Outcome: Progressing Patient denies SI/HI.      

## 2015-08-27 NOTE — Progress Notes (Signed)
Recreation Therapy Notes  Date: 09.16.16 Time: 3:00 pm Location: Craft Room  Group Topic: Coping Skills  Goal Area(s) Addresses:  Patient will participate in healthy coping skill. Patient will verbalize at least one emotion experienced during group.  Behavioral Response: Did not attend  Intervention: Coloring  Activity: Patients were instructed to color either mandalas or other coloring sheets and focus on the emotions they were experiencing.  Education: LRT educated patients on different coping skills.  Education Outcome: Patient did not attend group.  Clinical Observations/Feedback: Patient did not attend group.  Jacquelynn Cree, LRT/CTRS 08/27/2015 4:44 PM

## 2015-08-27 NOTE — Progress Notes (Signed)
Patient continues to stay to self.  Verbalizes that she does not need to be here and that she has already been discharged 3 times.  Appetite poor.  Agreed to take medications by mouth.  Continues to be delusional.  When talking with Dr. Earleen Newport states "I have three children and 2 of them I have not met yet."  Told Dr. Earleen Newport that she would not take her antibiotic because she did not need it she just needed to be out of here.

## 2015-08-27 NOTE — Plan of Care (Signed)
Problem: Alteration in mood Goal: LTG-Pt's behavior demonstrates decreased signs of depression (Patient's behavior demonstrates decreased signs of depression to the point the patient is safe to return home and continue treatment in an outpatient setting)  Outcome: Not Progressing Patient isolates to self and did not attend evening group.

## 2015-08-27 NOTE — Plan of Care (Signed)
Problem: Alteration in mood; excessive anxiety as evidenced by: Goal: LTG-Patient's behavior demonstrates decreased anxiety (Patient's behavior demonstrates anxiety and he/she is utilizing learned coping skills to deal with anxiety-producing situations)  Outcome: Not Progressing Patient seclusive to room.  Reluctant to interact with staff.  No interaction noted with peers. Mistrustful of staff and continues to be delusional

## 2015-08-27 NOTE — Progress Notes (Signed)
[facility] MD Progress Note  08/27/2015 9:22 AM Tricia Kerr  MRN:  [id number] Subjective:  Patient continues to voice bizarre delusions. Today patient states that this writer is having a relationship with her husband. That her husband has filed a restraining order against me.  She continues to say that police officers are outside the unit waiting to arrest me.  Patient is looking forward to be discharged because she misses her children.  Hospitalist has started the patient on processing IM daily as she was not taking the oral diabetic for UTI and cystitis.  She had not eaten in 2 days however today she finally ate something during lunch.  Patient wasn't started on nonemergency forced medications on September 15.  She received 1 dose of Zyprexa IM but after that she has been taking her medications orally.  Still reporting to nursing staff that she has diamonds in her abdomen and copper wire in her chest.    Patient reports that last night her "ex-husband" came to visit her last night, patient says they had a good visit.  On 9/14 pt started c/o severe abdominal pain but her explanation of the cause of the pain was again bizarre.  Pt says her ribs are fractured (not congruent with examination).  Per nursing she had 2 episodes of vomiting.  Reported that her pain was severe. She  was seeing stating on the hallway hyperventilating and unable to walk without holding from the rails.  She had to be assisted back to her bed. All VS were wnl.  Examination only showed tenderness on the left upper quadrant.  Abd was soft and not distended.  No pain around the chest wall.  Hospitalist evaluated pt.  after reviewing labs and imaging the only abnormality found was a UTI-cystitis. Patient is being followed by by a hospitalist and has been is started on Rocephin.  Head CT was negative.  Per nursing: D: Patient denies SI/HI/AVH, Patient is pleasant and cooperative. Patient stated she feels better from taking her medication,  she appears less anxious and is interacting with peers and staff appropriately.  A: Pt was offered support and encouragement. Pt was given scheduled medications. Pt was encouraged to attend groups. Q 15 minute checks were done for safety.  R: Pt attends groups and interacts well with peers and staff. Pt is taking medication. Patient is receptive to treatment and safety maintained on unit.  Principal Problem: Psychotic disorder with delusions Diagnosis:   Patient Active Problem List   Diagnosis Date Noted  . Tobacco use disorder [Z72.0] 08/23/2015  . Cannabis use disorder, severe, dependence [F12.20] 08/23/2015  . 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 Anxiety   . Bipolar disorder     Past Surgical History  Procedure Laterality Date  . Appendectomy    . Tubial    . Tubal ligation     Family History: History reviewed. No pertinent family history. Social History:  History  Alcohol Use  . Yes     History  Drug Use  . 3.00 per week  . Special: Marijuana    Social History   Social History  . Marital Status: Single    Spouse Name: N/A  . Number of Children: N/A  . Years of Education: N/A   Social History Main Topics  . Smoking status: Current Every Day Smoker -- 1.00 packs/day    Types:  Cigarettes  . Smokeless tobacco: Never Used  . Alcohol Use: Yes  . Drug Use: 3.00 per week    Special: Marijuana  . Sexual Activity: Not Currently   Other Topics Concern  . None   Social History Narrative   Additional History:    Sleep: Good  Appetite:  Poor   Assessment:   Musculoskeletal: Strength & Muscle Tone: within normal limits Gait & Station: normal Patient leans: N/A   Psychiatric Specialty Exam: Physical Exam  Constitutional: She is oriented to person, place, and time. She appears well-developed and well-nourished. No distress.   HENT:  Head: Normocephalic and atraumatic.  Respiratory: Breath sounds normal.  Neurological: She is alert and oriented to person, place, and time.  Skin: She is not diaphoretic.    Review of Systems  HENT: Negative.   Eyes: Negative.   Respiratory: Negative for shortness of breath.   Cardiovascular: Negative for palpitations.  Gastrointestinal: Positive for nausea, vomiting and abdominal pain.  Genitourinary: Negative.   Musculoskeletal: Positive for back pain.  Skin: Negative.   Neurological: Negative for weakness.  Endo/Heme/Allergies: Negative.   Psychiatric/Behavioral: Negative.     Blood pressure 118/86, pulse 130, temperature 98.4 F (36.9 C), temperature source Oral, resp. rate 18, height 5' (1.524 m), weight 48.081 kg (106 lb), SpO2 99 %.Body mass index is 20.7 kg/(m^2).  General Appearance: Fairly Groomed  Engineer, water::  Good  Speech:  Normal Rate  Volume:  Normal  Mood:  Irritable  Affect:  Congruent  Thought Process:  Disorganized  Orientation:  Full (Time, Place, and Person)  Thought Content:  Delusions and Paranoid Ideation  Suicidal Thoughts:  No  Homicidal Thoughts:  No  Memory:  Immediate;   Fair Recent;   Fair Remote;   Fair  Judgement:  Impaired  Insight:  Lacking  Psychomotor Activity:  Normal  Concentration:  NA  Recall:  NA  Fund of Knowledge:Fair  Language: Good  Akathisia:  No  Handed:    AIMS (if indicated):     Assets:  Financial Resources/Insurance Housing Social Support  ADL's:  Intact  Cognition: WNL  Sleep:  Number of Hours: 8.75     Current Medications: Current Facility-Administered Medications  Medication Dose Route Frequency Provider Last Rate Last Dose  . acetaminophen (TYLENOL) tablet 650 mg  650 mg Oral Q6H PRN Dewain Penning, MD      . alum & mag hydroxide-simeth (MAALOX/MYLANTA) 200-200-20 MG/5ML suspension 30 mL  30 mL Oral Q4H PRN Dewain Penning, MD      . cefTRIAXone (ROCEPHIN) injection 250 mg  250 mg Intramuscular  Q24H Loletha Grayer, MD   250 mg at 08/26/15 1733  . ibuprofen (ADVIL,MOTRIN) tablet 600 mg  600 mg Oral Q6H PRN Hildred Priest, MD      . LORazepam (ATIVAN) tablet 0.5 mg  0.5 mg Oral BID Hildred Priest, MD   0.5 mg at 08/26/15 2035  . LORazepam (ATIVAN) tablet 2 mg  2 mg Oral Q6H PRN Hildred Priest, MD   2 mg at 08/25/15 1449  . magnesium hydroxide (MILK OF MAGNESIA) suspension 30 mL  30 mL Oral Daily PRN Dewain Penning, MD      . OLANZapine (ZYPREXA) injection 10 mg  10 mg Intramuscular BID Hildred Priest, MD      . OLANZapine zydis (ZYPREXA) disintegrating tablet 10 mg  10 mg Oral BID Hildred Priest, MD      . ondansetron (ZOFRAN-ODT) disintegrating tablet 8 mg  8 mg Oral Q8H PRN  Hildred Priest, MD   8 mg at 08/26/15 2036  . traMADol (ULTRAM) tablet 50 mg  50 mg Oral Q12H PRN Hildred Priest, MD        Lab Results:  Results for orders placed or performed during the hospital encounter of 08/23/15 (from the past 48 hour(s))  Urine culture     Status: None (Preliminary result)   Collection Time: 08/25/15  2:24 PM  Result Value Ref Range   Specimen Description URINE, CLEAN CATCH    Special Requests Normal    Culture TOO YOUNG TO READ    Report Status PENDING   Urinalysis complete, with microscopic ([facility] only)     Status: Abnormal   Collection Time: 08/25/15  2:24 PM  Result Value Ref Range   Color, Urine YELLOW (A) YELLOW   APPearance CLOUDY (A) CLEAR   Glucose, UA 50 (A) NEGATIVE mg/dL   Bilirubin Urine NEGATIVE NEGATIVE   Ketones, ur 2+ (A) NEGATIVE mg/dL   Specific Gravity, Urine 1.028 1.005 - 1.030   Hgb urine dipstick 1+ (A) NEGATIVE   pH 5.0 5.0 - 8.0   Protein, ur 100 (A) NEGATIVE mg/dL   Nitrite NEGATIVE NEGATIVE   Leukocytes, UA 1+ (A) NEGATIVE   RBC / HPF 6-30 0 - 5 RBC/hpf   WBC, UA TOO NUMEROUS TO COUNT 0 - 5 WBC/hpf   Bacteria, UA RARE (A) NONE SEEN   Squamous Epithelial / LPF 6-30 (A) NONE  SEEN   Mucous PRESENT   CBC     Status: Abnormal   Collection Time: 08/25/15  2:31 PM  Result Value Ref Range   WBC 20.3 (H) 3.6 - 11.0 K/uL   RBC 5.38 (H) 3.80 - 5.20 MIL/uL   Hemoglobin 16.6 (H) 12.0 - 16.0 g/dL   HCT 48.1 (H) 35.0 - 47.0 %   MCV 89.3 80.0 - 100.0 fL   MCH 30.9 26.0 - 34.0 pg   MCHC 34.6 32.0 - 36.0 g/dL   RDW 13.2 11.5 - 14.5 %   Platelets 305 150 - 440 K/uL  Comprehensive metabolic panel     Status: Abnormal   Collection Time: 08/25/15  2:31 PM  Result Value Ref Range   Sodium 140 135 - 145 mmol/L   Potassium 5.0 3.5 - 5.1 mmol/L   Chloride 99 (L) 101 - 111 mmol/L   CO2 27 22 - 32 mmol/L   Glucose, Bld 170 (H) 65 - 99 mg/dL   BUN 22 (H) 6 - 20 mg/dL   Creatinine, Ser 0.88 0.44 - 1.00 mg/dL   Calcium 10.1 8.9 - 10.3 mg/dL   Total Protein 8.7 (H) 6.5 - 8.1 g/dL   Albumin 4.8 3.5 - 5.0 g/dL   AST 32 15 - 41 U/L   ALT 17 14 - 54 U/L   Alkaline Phosphatase 87 38 - 126 U/L   Total Bilirubin 1.5 (H) 0.3 - 1.2 mg/dL   GFR calc non Af Amer >60 >60 mL/min   GFR calc Af Amer >60 >60 mL/min    Comment: (NOTE) The eGFR has been calculated using the CKD EPI equation. This calculation has not been validated in all clinical situations. eGFR's persistently <60 mL/min signify possible Chronic Kidney Disease.    Anion gap 14 5 - 15  Glucose, capillary     Status: Abnormal   Collection Time: 08/25/15  5:05 PM  Result Value Ref Range   Glucose-Capillary 144 (H) 65 - 99 mg/dL   Comment 1 Notify RN  Physical Findings: AIMS: Facial and Oral Movements Muscles of Facial Expression: None, normal Lips and Perioral Area: None, normal Jaw: None, normal Tongue: None, normal,Extremity Movements Upper (arms, wrists, hands, fingers): None, normal Lower (legs, knees, ankles, toes): None, normal, Trunk Movements Neck, shoulders, hips: None, normal, Overall Severity Severity of abnormal movements (highest score from questions above): None, normal Incapacitation due to  abnormal movements: None, normal Patient's awareness of abnormal movements (rate only patient's report): Aware, no distress, Dental Status Current problems with teeth and/or dentures?: No Does patient usually wear dentures?: No  CIWA:  CIWA-Ar Total: 6 COWS:     Treatment Plan Summary: Daily contact with patient to assess and evaluate symptoms and progress in treatment and Medication management   29 y/o wf with new onset psychosis. According to the emergency department on the involuntary commitment by her family as she has been very disorganized for the last 5 weeks. Over this past week and the patient was becoming agitated and aggressive.  Unspecified psychotic disorder: not known family history of mental illness, one prior hospitalization when the patient was 29 years old. Per family patient has developed psychotic symptoms over the last couple of months. Family denies history of drug use or abusing prescription medications. Pt has been started on olanzapine zydis 5 mg qam and 10 mg qhs. Patient has been inconsistently compliant with anti-psychotics. She appears to have very poor insight into why she is in the hospital and the need for psychotherapy medications. Delusional thoughts seem to have worsened since admission as patient is now requesting to have surgery and has started refusing to eat saying that she has wires in her abdomen and chest and diamonds in her stomach.  Today dose of olanzapine will be increased to 10 mg by mouth twice a day. Patient has been started on non-emergency forced medication since September 15 if she refuses oral olanzapine she is to receive 10 mg of olanzapine IM.  Agitation: I will order Ativan 2 mg by mouth every 6 hours as needed  Anxiety: will start ativan 0.5 mg po bid  Tobacco use disorder: Patient states she does not need the gum and claims to be allergic to the nicotine patch  Vomiting/abdominal pain/weakness: appears all was secondary to UTI.  Abd  and pelvis CT wnl. UA suggest UTI and WBC elevated.  No fever and other VS were wnl.  Seen by hospitalist   UTI: Continue Rocephin IM every 24 hours. Patient was noncompliant with Macrobid and started to complaining of vomiting. She's been follow-up by hospitalist  Labs: TSH and ammonia wnl, B12 wnl, RPR -, HIV -.  Labs repeated today CBC WBC elevated, CMP wnl. UA suggestive of UTI.  Culture shows multiple bacteria suggesting contamination.  Imaging: completed head CT which was wnl.  Abd-pelvis CT was wnl--possible infection.  Precautions and every 15 minute checks   Discharge planning: Once stable the patient will be discharged back to her husband house. She will be scheduled to follow-up with [facility].  We have contacted the patient's husband twice and he has been updated about her treatment.  Medical Decision Making:  New problem, with additional work up planned     Hildred Priest 08/27/2015, 9:22 AM

## 2015-08-27 NOTE — Progress Notes (Signed)
Patient ID: Tricia Kerr, female   DOB: 10-17-1986, 29 y.o.   MRN: [id number] [facility] PROGRESS NOTE  PCP: No PCP Per Patient  HPI/Subjective: Patient states that she feels fine physically. She wants to go home. She wants me to release her and if I don't release or she will sue. She states that she will not take antibiotics by mouth. She states that she's eating and drinking and she is not dehydrated. She said she had diarrhea yesterday. The patient's nurse today Jamesetta So was with me during the entire interview and exam.  Objective: Filed Vitals:   08/27/15 0703  BP: 118/86  Pulse: 130  Temp: 98.4 F (36.9 C)  Resp: 18    Filed Weights   08/23/15 1448  Weight: 48.081 kg (106 lb)    ROS: Review of Systems  Constitutional: Negative for fever and chills.  Eyes: Negative for blurred vision.  Respiratory: Positive for cough. Negative for shortness of breath.   Cardiovascular: Positive for chest pain.  Gastrointestinal: Negative for nausea, vomiting, abdominal pain, diarrhea and constipation.  Genitourinary: Negative for dysuria.  Musculoskeletal: Negative for joint pain.  Neurological: Negative for dizziness and headaches.   Exam: Physical Exam  HENT:  Nose: No mucosal edema.  Mouth/Throat: No oropharyngeal exudate or posterior oropharyngeal edema.  Eyes: Conjunctivae, EOM and lids are normal. Pupils are equal, round, and reactive to light.  Neck: No JVD present. Carotid bruit is not present. No edema present. No thyroid mass and no thyromegaly present.  Cardiovascular: S1 normal and S2 normal.  Tachycardia present.  Exam reveals no gallop.   No murmur heard. Pulses:      Dorsalis pedis pulses are 2+ on the right side, and 2+ on the left side.  Respiratory: No respiratory distress. She has no wheezes. She has no rhonchi. She has no rales.  GI: Soft. Bowel sounds are normal. There is no tenderness.  Musculoskeletal:       Right ankle: She exhibits no  swelling.       Left ankle: She exhibits no swelling.  Lymphadenopathy:    She has no cervical adenopathy.  Neurological: She is alert. No cranial nerve deficit.  Skin: Skin is warm. No rash noted. Nails show no clubbing.  Psychiatric: Thought content is delusional.    Data Reviewed: Basic Metabolic Panel:  Recent Labs Lab 08/21/15 1614 08/25/15 1431  NA 139 140  K 3.8 5.0  CL 106 99*  CO2 24 27  GLUCOSE 103* 170*  BUN 14 22*  CREATININE 0.75 0.88  CALCIUM 9.2 10.1   Liver Function Tests:  Recent Labs Lab 08/21/15 1614 08/25/15 1431  AST 28 32  ALT 22 17  ALKPHOS 75 87  BILITOT 1.4* 1.5*  PROT 7.8 8.7*  ALBUMIN 4.7 4.8    Recent Labs Lab 08/23/15 1918  AMMONIA 20   CBC:  Recent Labs Lab 08/21/15 1614 08/25/15 1431  WBC 12.1* 20.3*  HGB 15.3 16.6*  HCT 46.1 48.1*  MCV 90.3 89.3  PLT 323 305    CBG:  Recent Labs Lab 08/25/15 1705  GLUCAP 144*    Recent Results (from the past 240 hour(s))  Urine culture     Status: None   Collection Time: 08/25/15  2:24 PM  Result Value Ref Range Status   Specimen Description URINE, CLEAN CATCH  Final   Special Requests Normal  Final   Culture MULTIPLE SPECIES PRESENT, SUGGEST RECOLLECTION  Final   Report Status 08/27/2015 FINAL  Final  Studies: Ct Head W Wo Contrast  08/25/2015   CLINICAL DATA:  Acute psychosis.  EXAM: CT HEAD WITHOUT AND WITH CONTRAST  TECHNIQUE: Contiguous axial images were obtained from the base of the skull through the vertex without and with intravenous contrast  CONTRAST:  OMNIPAQUE IOHEXOL 300 MG/ML  SOLN  COMPARISON:  None.  FINDINGS: Suboptimal evaluation at the level of the skullbase due to artifact. There is no evidence of acute cortical infarct, intracranial hemorrhage, mass, midline shift, or extra-axial fluid collection. Ventricles and sulci are normal. No abnormal enhancement is identified.  Mild posterior right ethmoid air cell mucosal thickening is partially  visualized. Mastoid air cells are clear. No skull fracture. Visualized orbits are unremarkable.  IMPRESSION: No evidence of acute intracranial abnormality.   Electronically Signed   By: Sebastian Ache M.D.   On: 08/25/2015 17:07   Ct Abdomen Pelvis W Contrast  08/25/2015   CLINICAL DATA:  CT Head WWO and CT Abdomen Pelvis combo. Pt refused oral contrast.29 year old female admitted to psych with acute onset of "psychosis". Hospitalist called due to abdominal pain.  MD notes are as follows:1. Abdominal pain: I cannot illicit abdominal pain on examination. I can not tell if this is true abdominal pain or from her psychosis as she is stating that she has gold in her abdomen and 8 rib fractures and she is no tenderness or inflammation.I agree with some sort of imaging. CT ABDOMEN has been ordered so we can follow up on the results.2. Psychosis: This sounds PSYCH in nature as medical reasons i.e B12, TSH, HIV, RPR and NH# are normal. I agree with CT HEAD.3. Nausea: Unclear etiology as to abdominal pain. I would check a urine pregnancy if not already performed. Follow-up on CT of the abdomen.4. Tobacco dependence: Patient is encouraged to stop smoking. Patient is counseled for 3 minutes. Patient says "I will not quit smoking"Chief Complaint: abdominal pain and 8 rib fractures  EXAM: CT ABDOMEN AND PELVIS WITH CONTRAST  TECHNIQUE: Multidetector CT imaging of the abdomen and pelvis was performed using the standard protocol following bolus administration of intravenous contrast.  CONTRAST:  OMNIPAQUE IOHEXOL 300 MG/ML  SOLN  COMPARISON:  09/12/2014  FINDINGS: Lung bases:  Clear.  Heart normal size.  Liver, spleen, gallbladder, pancreas, adrenal glands:  Normal.  Kidneys, ureters, bladder: Normal kidneys in ureters. Bladder is only mildly distended. Wall appears thickened. No bladder mass or stone.  Uterus and adnexa:  Unremarkable.  Lymph nodes:  No adenopathy.  Ascites:  None.  Gastrointestinal: Unremarkable. Appendix  not visualized. No evidence of appendicitis.  Musculoskeletal:  Unremarkable.  IMPRESSION: 1. Bladder wall appears thickened. This may reflect an infectious or inflammatory cystitis. 2. No other evidence of an acute abnormality. Exam otherwise normal.   Electronically Signed   By: Amie Portland M.D.   On: 08/25/2015 17:00    Scheduled Meds: . cefTRIAXone (ROCEPHIN) IM  250 mg Intramuscular Q24H  . LORazepam  0.5 mg Oral BID  . OLANZapine  10 mg Intramuscular BID  . OLANZapine zydis  10 mg Oral BID   Continuous Infusions:   Assessment/Plan:  1. Abdominal pain, nausea vomiting. Unclear if this is real or not. CT scan of the abdomen and pelvis was negative. When necessary nausea medication. 2. Acute cystitis with hematuria- patient not taking oral medication. I switched to IM Rocephin yesterday. We'll continue today and tomorrow of IM Rocephin. 3. Leukocytosis- could be secondary to nausea vomiting or the acute cystitis. Continue  to monitor. 4. Tachycardia and dehydration.- Patient encouraged to eat and drink. 5. Psychiatry treating psychosis with olanzapine.  Code Status:     Code Status Orders        Start     Ordered   08/23/15 1523  Full code   Continuous     08/23/15 1522    Advance Directive Documentation        Most Recent Value   Type of Advance Directive  -- [N/A]   Pre-existing out of facility DNR order (yellow form or pink MOST form)     "MOST" Form in Place?       Disposition Plan: Up to psychiatry.  Time spent: 20 minutes, patient examined with the nursing staff.  Alford Highland  [facility]

## 2015-08-27 NOTE — Progress Notes (Signed)
D: Pt denies SI/HI/AVH, but noted responding to internal stimuli, and she is delusional; thinking she has diamonds in her abdominal cavity. Patient reoriented that she does not have diamonds in her ABD. Patient appears anxious; was given support and medicated. Patient is not  interacting with peers and staff appropriately.  A: Pt was offered support and encouragement. Pt was given scheduled medications. Pt was encouraged to attend groups. Q 15 minute checks were done for safety.  R:Patient did not attend evening group. Patient is compliant with medication. Pt receptive to treatment and safety maintained on unit.

## 2015-08-28 LAB — CBC
HCT: 50.5 % — ABNORMAL HIGH (ref 35.0–47.0)
HEMOGLOBIN: 17.3 g/dL — AB (ref 12.0–16.0)
MCH: 30.8 pg (ref 26.0–34.0)
MCHC: 34.3 g/dL (ref 32.0–36.0)
MCV: 89.8 fL (ref 80.0–100.0)
Platelets: 282 10*3/uL (ref 150–440)
RBC: 5.62 MIL/uL — AB (ref 3.80–5.20)
RDW: 13.4 % (ref 11.5–14.5)
WBC: 9.7 10*3/uL (ref 3.6–11.0)

## 2015-08-28 LAB — BASIC METABOLIC PANEL
ANION GAP: 10 (ref 5–15)
BUN: 23 mg/dL — AB (ref 6–20)
CHLORIDE: 97 mmol/L — AB (ref 101–111)
CO2: 31 mmol/L (ref 22–32)
Calcium: 9.9 mg/dL (ref 8.9–10.3)
Creatinine, Ser: 0.83 mg/dL (ref 0.44–1.00)
GFR calc Af Amer: >60 mL/min (ref >60)
GFR calc non Af Amer: >60 mL/min (ref >60)
Glucose, Bld: 87 mg/dL (ref 65–99)
POTASSIUM: 3.9 mmol/L (ref 3.5–5.1)
SODIUM: 138 mmol/L (ref 135–145)

## 2015-08-28 NOTE — Progress Notes (Signed)
Patient ID: Tricia Kerr, female   DOB: 27-Jan-1986, 29 y.o.   MRN: [id number] [facility] PROGRESS NOTE  PCP: No PCP Per Patient  HPI/Subjective: I went in to see the patient with nurse Herbert Seta. Patient sat up to talk with me and stated that she think she was going home today. I told her that I do not make the decisions on that issue since she is on the psychiatry floor. The patient stated she had some vaginal bleeding today. She gets her Depo shots as outpatient. She states that she is eating. Patient is still threatening to sue.  Objective: Filed Vitals:   08/28/15 0702  BP: 128/91  Pulse: 121  Temp: 98.6 F (37 C)  Resp: 20    Filed Weights   08/23/15 1448  Weight: 48.081 kg (106 lb)    ROS: Review of Systems  Constitutional: Negative for fever and chills.  Eyes: Negative for blurred vision.  Respiratory: Negative for cough and shortness of breath.   Cardiovascular: Negative for chest pain.  Gastrointestinal: Negative for nausea, vomiting, abdominal pain, diarrhea and constipation.  Genitourinary: Negative for dysuria.  Musculoskeletal: Negative for joint pain.  Neurological: Negative for dizziness and headaches.   Exam: Physical Exam Patient refused physical exam today stating that I examined her yesterday and nothing has changed.  Data Reviewed: Basic Metabolic Panel:  Recent Labs Lab 08/21/15 1614 08/25/15 1431 08/28/15 0555  NA 139 140 138  K 3.8 5.0 3.9  CL 106 99* 97*  CO2 GLUCOSE 103* 170* 87  BUN 14 22* 23*  CREATININE 0.75 0.88 0.83  CALCIUM 9.2 10.1 9.9   Liver Function Tests:  Recent Labs Lab 08/21/15 1614 08/25/15 1431  AST 28 32  ALT 22 17  ALKPHOS 75 87  BILITOT 1.4* 1.5*  PROT 7.8 8.7*  ALBUMIN 4.7 4.8    Recent Labs Lab 08/23/15 1918  AMMONIA 20   CBC:  Recent Labs Lab 08/21/15 1614 08/25/15 1431 08/28/15 0555  WBC 12.1* 20.3* 9.7  HGB 15.3 16.6* 17.3*  HCT 46.1 48.1* 50.5*  MCV 90.3  89.3 89.8  PLT 323 305 282    CBG:  Recent Labs Lab 08/25/15 1705  GLUCAP 144*    Recent Results (from the past 240 hour(s))  Urine culture     Status: None   Collection Time: 08/25/15  2:24 PM  Result Value Ref Range Status   Specimen Description URINE, CLEAN CATCH  Final   Special Requests Normal  Final   Culture MULTIPLE SPECIES PRESENT, SUGGEST RECOLLECTION  Final   Report Status 08/27/2015 FINAL  Final      Scheduled Meds: . cefTRIAXone (ROCEPHIN) IM  250 mg Intramuscular Q24H  . LORazepam  0.5 mg Oral BID  . OLANZapine  10 mg Intramuscular BID  . OLANZapine zydis  10 mg Oral BID    Assessment/Plan:  1. Tachycardia, Dehydration and poor appetite- patient's hemoglobin has been increasing and BUN is slightly up. I encouraged the patient to eat and drink. 2. Abdominal pain nausea and vomiting- resolved 3. Acute cystitis with hematuria- last dose of IM Rocephin today. A urine culture showed multiple organisms likely contamination 4. Leukocytosis- now normalized 5. Psychiatry treating psychosis with olanzapine and Ativan   Code Status:     Code Status Orders        Start     Ordered   08/23/15 1523  Full code   Continuous     08/23/15 1522  Advance Directive Documentation        Most Recent Value   Type of Advance Directive  -- [N/A]   Pre-existing out of facility DNR order (yellow form or pink MOST form)     "MOST" Form in Place?       Disposition Plan: As per psychiatry  Time spent: 20 minutes, I will sign off at this point since the patient is now refusing physical exam.  Alford Highland  [facility]

## 2015-08-28 NOTE — Progress Notes (Signed)
[facility] MD Progress Note  08/28/2015 4:20 PM Tricia Kerr  MRN:  [id number] Subjective:  Patient is a 29 year old female who was seen for follow-up. She continues to remain delusional and paranoid. She was talking about having bleeding after having her tubes tied. Then she started talking about having 5 children and the eggs  being implanted behind her ears. She reported that she has 40 eggs  and all of them were donated and they are all over. She remained delusional and was having tangential thought processes. It was difficult to redirect her. She reported that  if she has to spend another night in the hospital that she will sue everybody in this hospital. She is still reporting to the nursing staff about her diamonds in her abdomen and copper wire in her chest. She spends most of the time in her room.     Principal Problem: Psychotic disorder with delusions Diagnosis:   Patient Active Problem List   Diagnosis Date Noted  . Tobacco use disorder [Z72.0] 08/23/2015  . Cannabis use disorder, severe, dependence [F12.20] 08/23/2015  . 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 Anxiety   . Bipolar disorder     Past Surgical History  Procedure Laterality Date  . Appendectomy    . Tubial    . Tubal ligation     Family History: History reviewed. No pertinent family history. Social History:  History  Alcohol Use  . Yes     History  Drug Use  . 3.00 per week  . Special: Marijuana    Social History   Social History  . Marital Status: Single    Spouse Name: N/A  . Number of Children: N/A  . Years of Education: N/A   Social History Main Topics  . Smoking status: Current Every Day Smoker -- 1.00 packs/day    Types: Cigarettes  . Smokeless tobacco: Never Used  . Alcohol Use: Yes  . Drug Use: 3.00 per week    Special: Marijuana  . Sexual Activity: Not  Currently   Other Topics Concern  . None   Social History Narrative   Additional History:    Sleep: Good  Appetite:  Poor   Assessment:   Musculoskeletal: Strength & Muscle Tone: within normal limits Gait & Station: normal Patient leans: N/A   Psychiatric Specialty Exam: Physical Exam  Constitutional: She is oriented to person, place, and time. She appears well-developed and well-nourished. No distress.  HENT:  Head: Normocephalic and atraumatic.  Respiratory: Breath sounds normal.  Neurological: She is alert and oriented to person, place, and time.  Skin: She is not diaphoretic.    Review of Systems  HENT: Negative.   Eyes: Negative.   Respiratory: Negative for shortness of breath.   Cardiovascular: Negative for palpitations.  Gastrointestinal: Positive for nausea, vomiting and abdominal pain.  Genitourinary: Negative.   Musculoskeletal: Positive for back pain.  Skin: Negative.   Neurological: Negative for weakness.  Endo/Heme/Allergies: Negative.   Psychiatric/Behavioral: Negative.     Blood pressure 128/91, pulse 121, temperature 98.6 F (37 C), temperature source Oral, resp. rate 20, height 5' (1.524 m), weight 106 lb (48.081 kg), SpO2 99 %.Body mass index is 20.7 kg/(m^2).  General Appearance: Fairly Groomed  Engineer, water::  Good  Speech:  Normal Rate  Volume:  Normal  Mood:  Irritable  Affect:  Congruent  Thought Process:  Disorganized  Orientation:  Full (Time, Place, and Person)  Thought Content:  Delusions and Paranoid Ideation  Suicidal Thoughts:  No  Homicidal Thoughts:  No  Memory:  Immediate;   Fair Recent;   Fair Remote;   Fair  Judgement:  Impaired  Insight:  Lacking  Psychomotor Activity:  Normal  Concentration:  NA  Recall:  NA  Fund of Knowledge:Fair  Language: Good  Akathisia:  No  Handed:    AIMS (if indicated):     Assets:  Financial Resources/Insurance Housing Social Support  ADL's:  Intact  Cognition: WNL  Sleep:  Number  of Hours: 7.25     Current Medications: Current Facility-Administered Medications  Medication Dose Route Frequency Provider Last Rate Last Dose  . acetaminophen (TYLENOL) tablet 650 mg  650 mg Oral Q6H PRN Dewain Penning, MD      . alum & mag hydroxide-simeth (MAALOX/MYLANTA) 200-200-20 MG/5ML suspension 30 mL  30 mL Oral Q4H PRN Dewain Penning, MD      . ibuprofen (ADVIL,MOTRIN) tablet 600 mg  600 mg Oral Q6H PRN Hildred Priest, MD      . LORazepam (ATIVAN) tablet 0.5 mg  0.5 mg Oral BID Hildred Priest, MD   0.5 mg at 08/28/15 0953  . LORazepam (ATIVAN) tablet 2 mg  2 mg Oral Q6H PRN Hildred Priest, MD   2 mg at 08/25/15 1449  . magnesium hydroxide (MILK OF MAGNESIA) suspension 30 mL  30 mL Oral Daily PRN Dewain Penning, MD      . OLANZapine (ZYPREXA) injection 10 mg  10 mg Intramuscular BID Hildred Priest, MD   10 mg at 08/27/15 1000  . OLANZapine zydis (ZYPREXA) disintegrating tablet 10 mg  10 mg Oral BID Hildred Priest, MD   10 mg at 08/28/15 1856  . ondansetron (ZOFRAN-ODT) disintegrating tablet 8 mg  8 mg Oral Q8H PRN Hildred Priest, MD   8 mg at 08/26/15 2036  . traMADol (ULTRAM) tablet 50 mg  50 mg Oral Q12H PRN Hildred Priest, MD        Lab Results:  Results for orders placed or performed during the hospital encounter of 08/23/15 (from the past 48 hour(s))  CBC     Status: Abnormal   Collection Time: 08/28/15  5:55 AM  Result Value Ref Range   WBC 9.7 3.6 - 11.0 K/uL   RBC 5.62 (H) 3.80 - 5.20 MIL/uL   Hemoglobin 17.3 (H) 12.0 - 16.0 g/dL   HCT 50.5 (H) 35.0 - 47.0 %   MCV 89.8 80.0 - 100.0 fL   MCH 30.8 26.0 - 34.0 pg   MCHC 34.3 32.0 - 36.0 g/dL   RDW 13.4 11.5 - 14.5 %   Platelets 282 150 - 440 K/uL  Basic metabolic panel     Status: Abnormal   Collection Time: 08/28/15  5:55 AM  Result Value Ref Range   Sodium 138 135 - 145 mmol/L   Potassium 3.9 3.5 - 5.1 mmol/L   Chloride 97 (L) 101 - 111  mmol/L   CO2 31 22 - 32 mmol/L   Glucose, Bld 87 65 - 99 mg/dL   BUN 23 (H) 6 - 20 mg/dL   Creatinine, Ser 0.83 0.44 - 1.00 mg/dL   Calcium 9.9 8.9 - 10.3 mg/dL   GFR calc non Af Amer >60 >60 mL/min   GFR calc Af Amer >60 >60 mL/min    Comment: (NOTE) The eGFR has been calculated using the CKD EPI equation. This calculation has not  been validated in all clinical situations. eGFR's persistently <60 mL/min signify possible Chronic Kidney Disease.    Anion gap 10 5 - 15    Physical Findings: AIMS: Facial and Oral Movements Muscles of Facial Expression: None, normal Lips and Perioral Area: None, normal Jaw: None, normal Tongue: None, normal,Extremity Movements Upper (arms, wrists, hands, fingers): None, normal Lower (legs, knees, ankles, toes): None, normal, Trunk Movements Neck, shoulders, hips: None, normal, Overall Severity Severity of abnormal movements (highest score from questions above): None, normal Incapacitation due to abnormal movements: None, normal Patient's awareness of abnormal movements (rate only patient's report): Aware, no distress, Dental Status Current problems with teeth and/or dentures?: No Does patient usually wear dentures?: No  CIWA:  CIWA-Ar Total: 6 COWS:     Treatment Plan Summary: Daily contact with patient to assess and evaluate symptoms and progress in treatment and Medication management   29 y/o wf with new onset psychosis. According to the emergency department on the involuntary commitment by her family as she has been very disorganized for the last 5 weeks. Over this past week and the patient was becoming agitated and aggressive.  Unspecified psychotic disorder: Continue on olanzapine as prescribed.  Agitation: I will order Ativan 2 mg by mouth every 6 hours as needed  Anxiety: will start ativan 0.5 mg po bid  Tobacco use disorder: Patient states she does not need the gum and claims to be allergic to the nicotine patch  Vomiting/abdominal  pain/weakness: appears all was secondary to UTI.  Abd and pelvis CT wnl. UA suggest UTI and WBC elevated.  No fever and other VS were wnl.  Seen by hospitalist   UTI: Continue Rocephin IM every 24 hours. Patient was noncompliant with Macrobid and started to complaining of vomiting. She's been follow-up by hospitalist  Labs: TSH and ammonia wnl, B12 wnl, RPR -, HIV -.  Labs repeated today CBC WBC elevated, CMP wnl. UA suggestive of UTI.  Culture shows multiple bacteria suggesting contamination.  Imaging: completed head CT which was wnl.  Abd-pelvis CT was wnl--possible infection.  Precautions and every 15 minute checks   Discharge planning: Once stable the patient will be discharged back to her husband house. She will be scheduled to follow-up with [facility].  We have contacted the patient's husband twice and he has been updated about her treatment.  Medical Decision Making:  New problem, with additional work up planned     [employer] 08/28/2015, 4:20 PM

## 2015-08-28 NOTE — Progress Notes (Addendum)
Pt has been seclusive to her room except at meal time. Pt's mood and affect has been depressed. Pt denies SI and A/v hallucinations. Pt is able to contract for safety. Pt continues to be psychotic with delusions of having eggs behind her ears. Pt c/o having some ABD discomfort. Pt is able to contract for safety.  Dr. Renae Gloss in to visit.

## 2015-08-28 NOTE — [facility] Group Notes (Signed)
[facility] Group Notes:  (Nursing/MHT/Case Management/Adjunct)  Date:  08/28/2015  Time:  9:58 PM  Type of Therapy:  Evening Wrap-up Group  Participation Level:  Did Not Attend  Participation Quality:  N/A  Affect:  N/A  Cognitive:  N/A  Insight:  None  Engagement in Group:  None  Modes of Intervention:  Activity  Summary of Progress/Problems:  Tomasita Morrow 08/28/2015, 9:58 PM

## 2015-08-28 NOTE — [facility] Group Notes (Signed)
[facility] LCSW Group Therapy  08/28/2015 3:30 PM  Type of Therapy:  Group Therapy  Participation Level:  Did Not Attend  Modes of Intervention:  Discussion, Education, Socialization and Support  Summary of Progress/Problems:Balance in life: Patients will discuss the concept of balance and how it looks and feels to be unbalanced. Pt will identify areas in their life that is unbalanced and ways to become more balanced.    Candace L Hyatt MSW, LCSWA  08/28/2015, 3:30 PM 

## 2015-08-28 NOTE — Clinical Social Work Note (Signed)
CSW attempted to complete assessment. Pt was unable to participate due to current presentation. CSW will attempt again tomorrow.   Daisy Floro Hyatt MSW, LCSWA  08/28/2015 3:33 PM

## 2015-08-29 NOTE — Progress Notes (Signed)
Patient ID: Tricia Kerr, female   DOB: 02-07-1986, 29 y.o.   MRN: [id number]  CSW attempted to complete assessment. However, pt was unable to participate due to the severity of her delusions. CSW attempted to contact her husband, Tricia Kerr [phone] for collateral information. Voicemail was left requesting call back.   Daisy Floro Hyatt MSW, LCSWA  08/29/2015 11:38 AM

## 2015-08-29 NOTE — Progress Notes (Signed)
[facility] MD Progress Note  08/29/2015 6:22 PM Tricia Kerr  MRN:  [id number] Subjective:  Patient is a 29 year old female who was seen for follow-up. She continues to remain delusional and paranoid. She was talking about having bleeding and reported that her tubes are not tidy it. She reported that she also wants to donate blood. She remains paranoid and delusional during the interview. She spends most of the time in her room.     Principal Problem: Psychotic disorder with delusions Diagnosis:   Patient Active Problem List   Diagnosis Date Noted  . Tobacco use disorder [Z72.0] 08/23/2015  . Cannabis use disorder, severe, dependence [F12.20] 08/23/2015  . 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 Anxiety   . Bipolar disorder     Past Surgical History  Procedure Laterality Date  . Appendectomy    . Tubial    . Tubal ligation     Family History: History reviewed. No pertinent family history. Social History:  History  Alcohol Use  . Yes     History  Drug Use  . 3.00 per week  . Special: Marijuana    Social History   Social History  . Marital Status: Single    Spouse Name: N/A  . Number of Children: N/A  . Years of Education: N/A   Social History Main Topics  . Smoking status: Current Every Day Smoker -- 1.00 packs/day    Types: Cigarettes  . Smokeless tobacco: Never Used  . Alcohol Use: Yes  . Drug Use: 3.00 per week    Special: Marijuana  . Sexual Activity: Not Currently   Other Topics Concern  . None   Social History Narrative   Additional History:    Sleep: Good  Appetite:  Poor   Assessment:   Musculoskeletal: Strength & Muscle Tone: within normal limits Gait & Station: normal Patient leans: N/A   Psychiatric Specialty Exam: Physical Exam  Constitutional: She is oriented to person, place, and time. She appears  well-developed and well-nourished. No distress.  HENT:  Head: Normocephalic and atraumatic.  Respiratory: Breath sounds normal.  Neurological: She is alert and oriented to person, place, and time.  Skin: She is not diaphoretic.    Review of Systems  HENT: Negative.   Eyes: Negative.   Respiratory: Negative for shortness of breath.   Cardiovascular: Negative for palpitations.  Gastrointestinal: Positive for nausea, vomiting and abdominal pain.  Genitourinary: Negative.   Musculoskeletal: Positive for back pain.  Skin: Negative.   Neurological: Negative for weakness.  Endo/Heme/Allergies: Negative.   Psychiatric/Behavioral: Negative.     Blood pressure 107/78, pulse 144, temperature 98.6 F (37 C), temperature source Oral, resp. rate 20, height 5' (1.524 m), weight 106 lb (48.081 kg), SpO2 99 %.Body mass index is 20.7 kg/(m^2).  General Appearance: Fairly Groomed  Engineer, water::  Good  Speech:  Normal Rate  Volume:  Normal  Mood:  Irritable  Affect:  Congruent  Thought Process:  Disorganized  Orientation:  Full (Time, Place, and Person)  Thought Content:  Delusions and Paranoid Ideation  Suicidal Thoughts:  No  Homicidal Thoughts:  No  Memory:  Immediate;   Fair Recent;   Fair Remote;   Fair  Judgement:  Impaired  Insight:  Lacking  Psychomotor Activity:  Normal  Concentration:  NA  Recall:  NA  Fund of Knowledge:Fair  Language: Good  Akathisia:  No  Handed:    AIMS (if indicated):     Assets:  Financial Resources/Insurance Housing Social Support  ADL's:  Intact  Cognition: WNL  Sleep:  Number of Hours: 6     Current Medications: Current Facility-Administered Medications  Medication Dose Route Frequency Provider Last Rate Last Dose  . acetaminophen (TYLENOL) tablet 650 mg  650 mg Oral Q6H PRN Dewain Penning, MD      . alum & mag hydroxide-simeth (MAALOX/MYLANTA) 200-200-20 MG/5ML suspension 30 mL  30 mL Oral Q4H PRN Dewain Penning, MD      . ibuprofen  (ADVIL,MOTRIN) tablet 600 mg  600 mg Oral Q6H PRN Hildred Priest, MD      . LORazepam (ATIVAN) tablet 0.5 mg  0.5 mg Oral BID Hildred Priest, MD   0.5 mg at 08/29/15 0942  . LORazepam (ATIVAN) tablet 2 mg  2 mg Oral Q6H PRN Hildred Priest, MD   2 mg at 08/25/15 1449  . magnesium hydroxide (MILK OF MAGNESIA) suspension 30 mL  30 mL Oral Daily PRN Dewain Penning, MD      . OLANZapine (ZYPREXA) injection 10 mg  10 mg Intramuscular BID Hildred Priest, MD   10 mg at 08/27/15 1000  . OLANZapine zydis (ZYPREXA) disintegrating tablet 10 mg  10 mg Oral BID Hildred Priest, MD   10 mg at 08/29/15 0942  . ondansetron (ZOFRAN-ODT) disintegrating tablet 8 mg  8 mg Oral Q8H PRN Hildred Priest, MD   8 mg at 08/29/15 1413  . traMADol (ULTRAM) tablet 50 mg  50 mg Oral Q12H PRN Hildred Priest, MD        Lab Results:  Results for orders placed or performed during the hospital encounter of 08/23/15 (from the past 48 hour(s))  CBC     Status: Abnormal   Collection Time: 08/28/15  5:55 AM  Result Value Ref Range   WBC 9.7 3.6 - 11.0 K/uL   RBC 5.62 (H) 3.80 - 5.20 MIL/uL   Hemoglobin 17.3 (H) 12.0 - 16.0 g/dL   HCT 50.5 (H) 35.0 - 47.0 %   MCV 89.8 80.0 - 100.0 fL   MCH 30.8 26.0 - 34.0 pg   MCHC 34.3 32.0 - 36.0 g/dL   RDW 13.4 11.5 - 14.5 %   Platelets 282 150 - 440 K/uL  Basic metabolic panel     Status: Abnormal   Collection Time: 08/28/15  5:55 AM  Result Value Ref Range   Sodium 138 135 - 145 mmol/L   Potassium 3.9 3.5 - 5.1 mmol/L   Chloride 97 (L) 101 - 111 mmol/L   CO2 31 22 - 32 mmol/L   Glucose, Bld 87 65 - 99 mg/dL   BUN 23 (H) 6 - 20 mg/dL   Creatinine, Ser 0.83 0.44 - 1.00 mg/dL   Calcium 9.9 8.9 - 10.3 mg/dL   GFR calc non Af Amer >60 >60 mL/min   GFR calc Af Amer >60 >60 mL/min    Comment: (NOTE) The eGFR has been calculated using the CKD EPI equation. This calculation has not been validated in all clinical  situations. eGFR's persistently <60 mL/min signify possible Chronic Kidney Disease.    Anion gap 10 5 - 15    Physical Findings: AIMS: Facial and Oral Movements Muscles of Facial Expression: None, normal Lips and Perioral Area: None, normal Jaw: None, normal Tongue: None, normal,Extremity Movements Upper (arms, wrists, hands, fingers): None, normal Lower (legs, knees, ankles, toes): None, normal, Trunk Movements Neck, shoulders,  hips: None, normal, Overall Severity Severity of abnormal movements (highest score from questions above): None, normal Incapacitation due to abnormal movements: None, normal Patient's awareness of abnormal movements (rate only patient's report): Aware, no distress, Dental Status Current problems with teeth and/or dentures?: No Does patient usually wear dentures?: No  CIWA:  CIWA-Ar Total: 6 COWS:     Treatment Plan Summary: Daily contact with patient to assess and evaluate symptoms and progress in treatment and Medication management   29 y/o wf with new onset psychosis. According to the emergency department on the involuntary commitment by her family as she has been very disorganized for the last 5 weeks. Over this past week and the patient was becoming agitated and aggressive.  Unspecified psychotic disorder: Continue on olanzapine as prescribed.  Agitation: I will order Ativan 2 mg by mouth every 6 hours as needed  Anxiety: will start ativan 0.5 mg po bid  Tobacco use disorder: Patient states she does not need the gum and claims to be allergic to the nicotine patch  Vomiting/abdominal pain/weakness: appears all was secondary to UTI.  Abd and pelvis CT wnl. UA suggest UTI and WBC elevated.  No fever and other VS were wnl.  Seen by hospitalist   UTI: Continue Rocephin IM every 24 hours. Patient was noncompliant with Macrobid and started to complaining of vomiting. She's been follow-up by hospitalist  Labs: TSH and ammonia wnl, B12 wnl, RPR -, HIV -.   Labs repeated today CBC WBC elevated, CMP wnl. UA suggestive of UTI.  Culture shows multiple bacteria suggesting contamination.  Imaging: completed head CT which was wnl.  Abd-pelvis CT was wnl--possible infection.  Precautions and every 15 minute checks   Discharge planning: Once stable the patient will be discharged back to her husband house. She will be scheduled to follow-up with [facility].  We have contacted the patient's husband twice and he has been updated about her treatment.  Medical Decision Making:  New problem, with additional work up planned     [employer] 08/29/2015, 6:22 PM

## 2015-08-29 NOTE — Progress Notes (Signed)
This is a 29 y.o. Female; who continues to present with bizarre thinking; delusions; with stating, "I communicate through live streams; I had 40 eggs; and they were stolen and sold on the black market; I don't have fat in my stomach; that's gold; and I have diamonds where my appendix should be; and I'm here under the witness protection program; my real name is "[place]es." Client accepted and took her meds without difficulty.

## 2015-08-29 NOTE — Progress Notes (Signed)
Pt has been a little  less seclusive to her room.Pt c/o having some nausea and was given zofran po.  Pt's mood and affect has been depressed. Pt denies SI and A/v hallucinations.Pt c/o being tired . Pt is able to contract for safety. Will continue to observe and maintain a safe environment.

## 2015-08-29 NOTE — [facility] Group Notes (Signed)
[facility] LCSW Group Therapy  08/29/2015 2:36 PM  Type of Therapy:  Group Therapy  Participation Level:  Did Not Attend  Modes of Intervention:  Discussion, Education, Socialization and Support  Summary of Progress/Problems:Pt will learn how to identify obstacles, self-sabotaging and enabling behaviors, what they are and why we do them. Pt will discuss unhealthy relationships and how to have positive healthy boundaries with those that sabotage and enable. Pt will identify aspects of self sabotage and enabling in themselves and how to limit these behaviors.    Candace L Hyatt MSW, LCSWA  08/29/2015, 2:36 PM 

## 2015-08-30 MED ORDER — ENSURE ENLIVE PO LIQD
237.0000 mL | Freq: Two times a day (BID) | ORAL | Status: DC
  Administered 2015-08-30 – 2015-09-01 (×4): 237 mL via ORAL

## 2015-08-30 MED ORDER — OLANZAPINE 5 MG PO TBDP
15.0000 mg | ORAL_TABLET | Freq: Two times a day (BID) | ORAL | Status: DC
  Administered 2015-08-30 – 2015-09-01 (×5): 15 mg via ORAL
  Filled 2015-08-30 (×4): qty 3

## 2015-08-30 MED ORDER — METOPROLOL TARTRATE 25 MG PO TABS
12.5000 mg | ORAL_TABLET | Freq: Two times a day (BID) | ORAL | Status: DC
  Administered 2015-08-30 – 2015-09-09 (×21): 12.5 mg via ORAL
  Filled 2015-08-30 (×11): qty 1
  Filled 2015-08-30: qty 2
  Filled 2015-08-30 (×10): qty 1

## 2015-08-30 NOTE — [facility] Group Notes (Signed)
[facility] Group Notes:  (Nursing/MHT/Case Management/Adjunct)  Date:  08/30/2015  Time:  2:32 PM  Type of Therapy:  Psychoeducational Skills  Participation Level:  Did Not Attend   Tricia Kerr [facility] 08/30/2015, 2:32 PM

## 2015-08-30 NOTE — [facility] Group Notes (Signed)
[facility] LCSW Group Therapy  08/30/2015 2:55 PM  Type of Therapy:  Group Therapy  Participation Level:  Did Not Attend    Ingle, Jason T, MSW, LCSWA 08/30/2015, 2:55 PM  

## 2015-08-30 NOTE — [facility] Group Notes (Signed)
[facility] Group Notes:  (Nursing/MHT/Case Management/Adjunct)  Date:  08/30/2015  Time:  10:47 PM  Type of Therapy:  Group Therapy  Participation Level:  Active  Participation Quality:  Appropriate  Affect:  Appropriate  Cognitive:  Appropriate  Insight:  Good  Engagement in Group:  Engaged  Modes of Intervention:  Discussion  Summary of Progress/Problems:  Tricia Kerr 08/30/2015, 10:47 PM

## 2015-08-30 NOTE — Progress Notes (Signed)
D: Patient denies SI/HI/AVH. Patient affect and mood are anxious.  Patient did NOT attend evening group. Patient visible on the milieu. No distress noted. A: Support and encouragement offered. Scheduled medications given to pt. Q 15 min checks continued for patient safety. R: Patient receptive. Patient remains safe on the unit.   

## 2015-08-30 NOTE — Progress Notes (Signed)
Initial Nutrition Assessment    INTERVENTION:   Meals and Snacks: Cater to patient preferences Medical Food Supplement Therapy: recommend Ensure Enlive po BID, each supplement provides 350 kcal and 20 grams of protein Coordination of Care: recommend re-weighing pt as pt with poor po intake since admission   NUTRITION DIAGNOSIS:   Inadequate oral intake related to acute illness (altered cognition) as evidenced by meal completion < 25%.  GOAL:   Patient will meet greater than or equal to 90% of their needs  MONITOR:    (Energy Intake, Anthropometrics, Cogntion, Electrolyte/Renal profile, Digestive System)  REASON FOR ASSESSMENT:   LOS    ASSESSMENT:    Pt admitted with new onset pyshosis, unspecified psychotic disorder; UTI; pt remains delusional and paranoid   Past Medical History  Diagnosis Date  . Anxiety   . Bipolar disorder     Diet Order:  Diet regular Room service appropriate?: Yes; Fluid consistency:: Thin   Energy Intake: recorded po intake 16% on average  Electrolyte and Renal Profile:  Recent Labs Lab 08/25/15 1431 08/28/15 0555  BUN 22* 23*  CREATININE 0.88 0.83  NA 140 138  K 5.0 3.9   Meds: reviewed  Height:   Ht Readings from Last 1 Encounters:  08/23/15 5' (1.524 m)    Weight: 7.8% wt loss in July per weight encounters, no new weight since admission weight  Wt Readings from Last 1 Encounters:  08/23/15 106 lb (48.081 kg)    Filed Weights   08/23/15 1448  Weight: 106 lb (48.081 kg)    Wt Readings from Last 10 Encounters:  08/23/15 106 lb (48.081 kg)  08/21/15 108 lb (48.988 kg)  06/23/15 115 lb (52.164 kg)  05/07/15 116 lb (52.617 kg)    BMI:  Body mass index is 20.7 kg/(m^2).  Estimated Nutritional Needs:   Kcal:  1440-1680 kcals   Protein:  38-48 g  Fluid:  1440-1680 mL  MODERATE Care Level  Romelle Starcher MS, RD, LDN [phone] Pager

## 2015-08-30 NOTE — Progress Notes (Signed)
Recreation Therapy Notes  Date: 09.19.16 Time: 3:00 pm Location: Craft Room  Group Topic: Wellness  Goal Area(s) Addresses:  Patient will identify at least one item per each dimension of health. Patient will examine areas they are deficient in.  Behavioral Response: Did not attend  Intervention: 6 Dimensions of Health  Activity: Patients were given a sheet with the definitions of the 6 dimensions. Patients were given a worksheet with the 6 dimensions and were instructed to list at least one item in each dimension.   Education: LRT educated patients on ways to increase each dimension.  Education Outcome: Patient did not attend group.  Clinical Observations/Feedback: Patient did not attend group.  Jacquelynn Cree, LRT/CTRS 08/30/2015 4:19 PM

## 2015-08-30 NOTE — Progress Notes (Signed)
[facility] MD Progress Note  08/30/2015 1:34 PM Tricia Kerr  MRN:  [id number] Subjective:  Patient is a 29 year old female who was seen for follow-up.  Over the weekend she continued to be delusional and paranoid. She was talking about having bleeding and reported that her tubes are not tidy it. She reported that she also wants to donate blood. She remains paranoid and delusional during the interview. She spent most of the time in her room.  Today patient seen slightly improved as she was able to talk to assess about the urinary tract infection own without delusional thinking. She reported improvement in abdominal pain, and nausea. She reported eating and sleeping well. Initially she was calm and cooperative but as the interview progressed and she learned she was not going to be discharged today she became agitated and the delusions were more evident.  Patient says she needs to see the judge Freida Busman and says he's the only one that can discharge her.  She told this Clinical research associate to going look in her chart as judge Freida Busman had already signed all the discharge paperwork.  Patient wants to see a judge but does not want to wait until her hearing tomorrow once to do it today. At that point assessment was terminated and we were unable to resson with the patient.  Per nursing D: Patient denies SI/HI/AVH. Patient affect and mood are anxious. Patient did NOT attend evening group. Patient visible on the milieu. No distress noted. A: Support and encouragement offered. Scheduled medications given to pt. Q 15 min checks continued for patient safety. R: Patient receptive. Patient remains safe on the unit.   Principal Problem: Psychotic disorder with delusions Diagnosis:   Patient Active Problem List   Diagnosis Date Noted  . Tobacco use disorder [Z72.0] 08/23/2015  . Cannabis use disorder, severe, dependence [F12.20] 08/23/2015  . 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 Anxiety   . Bipolar disorder     Past Surgical History  Procedure Laterality Date  . Appendectomy    . Tubial    . Tubal ligation     Family History: History reviewed. No pertinent family history. Social History:  History  Alcohol Use  . Yes     History  Drug Use  . 3.00 per week  . Special: Marijuana    Social History   Social History  . Marital Status: Single    Spouse Name: N/A  . Number of Children: N/A  . Years of Education: N/A   Social History Main Topics  . Smoking status: Current Every Day Smoker -- 1.00 packs/day    Types: Cigarettes  . Smokeless tobacco: Never Used  . Alcohol Use: Yes  . Drug Use: 3.00 per week    Special: Marijuana  . Sexual Activity: Not Currently   Other Topics Concern  . None   Social History Narrative   Additional History:    Sleep: Good  Appetite:  Poor   Assessment:   Musculoskeletal: Strength & Muscle Tone: within normal limits Gait & Station: normal Patient leans: N/A   Psychiatric Specialty Exam: Physical Exam  Constitutional: She is oriented to person, place, and time. She appears well-developed and well-nourished. No distress.  HENT:  Head: Normocephalic and atraumatic.  Respiratory: Breath sounds normal.  Neurological: She is alert and oriented to person, place, and time.  Skin: She is not diaphoretic.  Review of Systems  HENT: Negative.   Eyes: Negative.   Respiratory: Negative for shortness of breath.   Cardiovascular: Negative for palpitations.  Gastrointestinal: Negative for nausea, vomiting and abdominal pain.  Genitourinary: Negative.   Musculoskeletal: Negative for back pain.  Skin: Negative.   Neurological: Negative for weakness.  Endo/Heme/Allergies: Negative.   Psychiatric/Behavioral: Negative.     Blood pressure 120/91, pulse 160, temperature 98.6 F (37 C), temperature source Oral, resp. rate 20, height 5'  (1.524 m), weight 48.081 kg (106 lb), SpO2 99 %.Body mass index is 20.7 kg/(m^2).  General Appearance: Fairly Groomed  Patent attorney::  Good  Speech:  Normal Rate  Volume:  Normal  Mood:  Irritable  Affect:  Congruent  Thought Process:  Disorganized  Orientation:  Full (Time, Place, and Person)  Thought Content:  Delusions and Paranoid Ideation  Suicidal Thoughts:  No  Homicidal Thoughts:  No  Memory:  Immediate;   Fair Recent;   Fair Remote;   Fair  Judgement:  Impaired  Insight:  Lacking  Psychomotor Activity:  Normal  Concentration:  NA  Recall:  NA  Fund of Knowledge:Fair  Language: Good  Akathisia:  No  Handed:    AIMS (if indicated):     Assets:  Financial Resources/Insurance Housing Social Support  ADL's:  Intact  Cognition: WNL  Sleep:  Number of Hours: 8     Current Medications: Current Facility-Administered Medications  Medication Dose Route Frequency Provider Last Rate Last Dose  . acetaminophen (TYLENOL) tablet 650 mg  650 mg Oral Q6H PRN Beau Fanny, MD      . alum & mag hydroxide-simeth (MAALOX/MYLANTA) 200-200-20 MG/5ML suspension 30 mL  30 mL Oral Q4H PRN Beau Fanny, MD      . ibuprofen (ADVIL,MOTRIN) tablet 600 mg  600 mg Oral Q6H PRN Jimmy Footman, MD      . LORazepam (ATIVAN) tablet 0.5 mg  0.5 mg Oral BID Jimmy Footman, MD   0.5 mg at 08/30/15 1035  . LORazepam (ATIVAN) tablet 2 mg  2 mg Oral Q6H PRN Jimmy Footman, MD   2 mg at 08/25/15 1449  . magnesium hydroxide (MILK OF MAGNESIA) suspension 30 mL  30 mL Oral Daily PRN Beau Fanny, MD      . OLANZapine (ZYPREXA) injection 10 mg  10 mg Intramuscular BID Jimmy Footman, MD   10 mg at 08/27/15 1000  . OLANZapine zydis (ZYPREXA) disintegrating tablet 15 mg  15 mg Oral BID Jimmy Footman, MD   15 mg at 08/30/15 1034  . ondansetron (ZOFRAN-ODT) disintegrating tablet 8 mg  8 mg Oral Q8H PRN Jimmy Footman, MD   8 mg at 08/29/15  1413  . traMADol (ULTRAM) tablet 50 mg  50 mg Oral Q12H PRN Jimmy Footman, MD        Lab Results:  No results found for this or any previous visit (from the past 48 hour(s)).  Physical Findings: AIMS: Facial and Oral Movements Muscles of Facial Expression: None, normal Lips and Perioral Area: None, normal Jaw: None, normal Tongue: None, normal,Extremity Movements Upper (arms, wrists, hands, fingers): None, normal Lower (legs, knees, ankles, toes): None, normal, Trunk Movements Neck, shoulders, hips: None, normal, Overall Severity Severity of abnormal movements (highest score from questions above): None, normal Incapacitation due to abnormal movements: None, normal Patient's awareness of abnormal movements (rate only patient's report): Aware, no distress, Dental Status Current problems with teeth and/or dentures?: No Does patient usually wear dentures?: No  CIWA:  CIWA-Ar  Total: 6 COWS:     Treatment Plan Summary: Daily contact with patient to assess and evaluate symptoms and progress in treatment and Medication management   29 y/o wf with new onset psychosis. According to the emergency department on the involuntary commitment by her family as she has been very disorganized for the last 5 weeks. Over this past week and the patient was becoming agitated and aggressive.  Unspecified psychotic disorder: Continue on olanzapine but will increase to 15 mg po bid (zydis).  Continue non emergency forced medications.  Olanzapine IM 10 mg bid if pt refuses oral.  Agitation: I will order Ativan 2 mg by mouth every 6 hours as needed  Anxiety: will start ativan 0.5 mg po bid  Tobacco use disorder: Patient states she does not need the gum and claims to be allergic to the nicotine patch  Vomiting/abdominal pain/weakness: appears all was secondary to UTI.  Abd and pelvis CT wnl. UA suggest UTI and WBC elevated.  No fever and other VS were wnl.  Seen by hospitalist   UTI: completed  rocephin.  Denies symptoms.  Labs: TSH and ammonia wnl, B12 wnl, RPR -, HIV -.  Labs repeated today CBC WBC elevated, CMP wnl. UA suggestive of UTI.  Culture shows multiple bacteria suggesting contamination.  Imaging: completed head CT which was wnl.  Abd-pelvis CT was wnl--possible infection.  Precautions and every 15 minute checks   Discharge planning: Once stable the patient will be discharged back to her husband house. She will be scheduled to follow-up with [facility].  We have contacted the patient's husband twice and he has been updated about her treatment.  Medical Decision Making:  New problem, with additional work up planned     Jimmy Footman 08/30/2015, 1:34 PM

## 2015-08-30 NOTE — Progress Notes (Signed)
Patient presents with a fixed smile. Denies depression and SI.  Continues to refer to her husband as her ex husband. Continues to blame her husband for being here.  Continues to stay to self

## 2015-08-30 NOTE — Plan of Care (Signed)
Problem: Alteration in mood Goal: LTG-Pt's behavior demonstrates decreased signs of depression (Patient's behavior demonstrates decreased signs of depression to the point the patient is safe to return home and continue treatment in an outpatient setting)  Outcome: Progressing Denies SI.   Fixed smile noted to face.

## 2015-08-31 DIAGNOSIS — F2081 Schizophreniform disorder: Secondary | ICD-10-CM

## 2015-08-31 NOTE — Progress Notes (Signed)
Recreation Therapy Notes  Date: 09.20.16 Time: 3:00 pm Location: Craft Room  Group Topic: Self-expression  Goal Area(s) Addresses:  Patient will be able to identify a color that represents each emotion. Patient will verbalize benefit of using art as a means of self-expression.  Behavioral Response: Did not attend  Intervention: The Colors Within Me  Activity: Patients were given a blank face worksheet and instructed to pick a color for each emotion they were experiencing and show how much of that color they were feeling on the face.  Education: LRT educated patients on different forms of self-expression   Education Outcome: Patient did not attend group.   Clinical Observations/Feedback: Patient did not attend group.  Greene,Elizabeth M, LRT/CTRS 08/31/2015 4:37 PM 

## 2015-08-31 NOTE — Progress Notes (Addendum)
[facility] MD Progress Note  08/31/2015 1:25 PM Tricia Kerr  MRN:  [id number] Subjective:  Patient was lying in bed late this morning. Yesterday she only ate 10% of her lunch. She is demanding to be discharged today. She is a schedule for involuntary commitment hearing today, she has very little understanding of this procedure and says she will not attend  if she is doesn't drive herself there.  She also states that she spoke with the owner of the hospital and that she has been discharged.   Later on this morning she was seeing being picked up by the [facility] to attend a court hearing, she appeared cooperative and calm.  During assessment this morning patient stated that she no longer was having abdominal pain, nausea or vomiting. She denied having any physical complaints. She denies having side effects from medications and says that finally last night she was given the right medications. Patient's heart rate has been about 100 consistently, just today she was started on metoprolol 12.5 mg by mouth twice a day. Patient thinks this is due to as giving her Adderall along with Ritalin and adrenaline shots.   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         Principal Problem: <principal problem not specified> Diagnosis:   Patient Active Problem List   Diagnosis Date Noted  . 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 Tobacco use disorder [Z72.0] 08/23/2015  . Cannabis use disorder, severe, dependence [F12.20] 08/23/2015   Total Time spent with patient: 30 minutes   Past Medical History:  Past Medical History  Diagnosis Date  . Anxiety   . Bipolar disorder     Past Surgical History  Procedure Laterality Date  .  Appendectomy    . Tubial    . Tubal ligation     Family History: History reviewed. No pertinent family history. Social History:  History  Alcohol Use  . Yes     History  Drug Use  . 3.00 per week  . Special: Marijuana    Social History   Social History  . Marital Status: Single    Spouse Name: N/A  . Number of Children: N/A  . Years of Education: N/A   Social History Main Topics  . Smoking status: Current Every Day Smoker -- 1.00 packs/day    Types: Cigarettes  . Smokeless tobacco: Never Used  . Alcohol Use: Yes  . Drug Use: 3.00 per week    Special: Marijuana  . Sexual Activity: Not Currently   Other Topics Concern  . None   Social History Narrative   Additional History:    Sleep: Good  Appetite:  Poor only ate 10 % of lunch yesterday.   Assessment:   Musculoskeletal: Strength & Muscle Tone: within normal limits Gait & Station: normal Patient leans: N/A   Psychiatric Specialty Exam: Physical Exam  Constitutional: She is oriented to person, place, and time. She appears well-developed and well-nourished. No distress.  HENT:  Head: Normocephalic and atraumatic.  Respiratory: Breath sounds normal.  Neurological: She is alert and oriented to person, place, and time.  Skin: She is not diaphoretic.    Review of Systems  Constitutional: Negative.   HENT: Negative.   Eyes: Negative.  Respiratory: Negative.  Negative for shortness of breath.   Cardiovascular: Negative.  Negative for palpitations.  Gastrointestinal: Negative for nausea, vomiting and abdominal pain.  Genitourinary: Negative.   Musculoskeletal: Negative.  Negative for back pain.  Skin: Negative.   Neurological: Negative.  Negative for weakness.  Endo/Heme/Allergies: Negative.   Psychiatric/Behavioral: Negative.     Blood pressure 126/97, pulse 130, temperature 98.4 F (36.9 C), temperature source Oral, resp. rate 21, height 5' (1.524 m), weight 44.453 kg (98 lb), SpO2 99 %.Body mass  index is 19.14 kg/(m^2).  General Appearance: Fairly Groomed  Patent attorney::  Good  Speech:  Normal Rate  Volume:  Normal  Mood:  Irritable  Affect:  Congruent  Thought Process:  Disorganized  Orientation:  Full (Time, Place, and Person)  Thought Content:  Delusions and Paranoid Ideation  Suicidal Thoughts:  No  Homicidal Thoughts:  No  Memory:  Immediate;   Fair Recent;   Fair Remote;   Fair  Judgement:  Impaired  Insight:  Lacking  Psychomotor Activity:  Normal  Concentration:  NA  Recall:  NA  Fund of Knowledge:Fair  Language: Good  Akathisia:  No  Handed:    AIMS (if indicated):     Assets:  Financial Resources/Insurance Housing Social Support  ADL's:  Intact  Cognition: WNL  Sleep:  Number of Hours: 6.25     Current Medications: Current Facility-Administered Medications  Medication Dose Route Frequency Shar Paez Last Rate Last Dose  . acetaminophen (TYLENOL) tablet 650 mg  650 mg Oral Q6H PRN Beau Fanny, MD      . alum & mag hydroxide-simeth (MAALOX/MYLANTA) 200-200-20 MG/5ML suspension 30 mL  30 mL Oral Q4H PRN Beau Fanny, MD      . feeding supplement (ENSURE ENLIVE) (ENSURE ENLIVE) liquid 237 mL  237 mL Oral BID WC Jimmy Footman, MD   237 mL at 08/31/15 0919  . ibuprofen (ADVIL,MOTRIN) tablet 600 mg  600 mg Oral Q6H PRN Jimmy Footman, MD      . LORazepam (ATIVAN) tablet 0.5 mg  0.5 mg Oral BID Jimmy Footman, MD   0.5 mg at 08/31/15 0917  . LORazepam (ATIVAN) tablet 2 mg  2 mg Oral Q6H PRN Jimmy Footman, MD   2 mg at 08/25/15 1449  . magnesium hydroxide (MILK OF MAGNESIA) suspension 30 mL  30 mL Oral Daily PRN Beau Fanny, MD      . metoprolol tartrate (LOPRESSOR) tablet 12.5 mg  12.5 mg Oral BID Jimmy Footman, MD   12.5 mg at 08/31/15 1610  . OLANZapine (ZYPREXA) injection 10 mg  10 mg Intramuscular BID Jimmy Footman, MD   10 mg at 08/31/15 9604  . OLANZapine zydis (ZYPREXA)  disintegrating tablet 15 mg  15 mg Oral BID Jimmy Footman, MD   15 mg at 08/31/15 0919  . ondansetron (ZOFRAN-ODT) disintegrating tablet 8 mg  8 mg Oral Q8H PRN Jimmy Footman, MD   8 mg at 08/29/15 1413  . traMADol (ULTRAM) tablet 50 mg  50 mg Oral Q12H PRN Jimmy Footman, MD        Lab Results:  No results found for this or any previous visit (from the past 48 hour(s)).  Physical Findings: AIMS: Facial and Oral Movements Muscles of Facial Expression: None, normal Lips and Perioral Area: None, normal Jaw: None, normal Tongue: None, normal,Extremity Movements Upper (arms, wrists, hands, fingers): None, normal Lower (legs, knees, ankles, toes): None, normal, Trunk Movements Neck, shoulders, hips: None, normal, Overall Severity Severity of abnormal  movements (highest score from questions above): None, normal Incapacitation due to abnormal movements: None, normal Patient's awareness of abnormal movements (rate only patient's report): Aware, no distress, Dental Status Current problems with teeth and/or dentures?: No Does patient usually wear dentures?: No  CIWA:  CIWA-Ar Total: 6 COWS:     Treatment Plan Summary: Daily contact with patient to assess and evaluate symptoms and progress in treatment and Medication management   29 y/o wf with new onset psychosis. According to the emergency department on the involuntary commitment by her family as she has been very disorganized for the last 5 weeks. Over this past week and the patient was becoming agitated and aggressive.  Unspecified psychotic disorder: Continue on olanzapine  15 mg po bid (zydis).  Continue non emergency forced medications.  Olanzapine IM 10 mg bid if pt refuses oral.  Agitation: I will order Ativan 2 mg by mouth every 6 hours as needed  Anxiety: will start ativan 0.5 mg po bid  Tobacco use disorder: Patient states she does not need the gum and claims to be allergic to the nicotine  patch  Vomiting/abdominal pain/weakness: appears all was secondary to UTI.  Abd and pelvis CT wnl. UA suggest UTI and WBC elevated.  No fever and other VS were wnl.  Seen by hospitalist last week.  No longer c/o pain/vomiting/nausea.   UTI: completed rocephin.  Denies symptoms.  Labs: TSH and ammonia wnl, B12 wnl, RPR -, HIV -.  Labs repeated today CBC WBC elevated, CMP wnl. UA suggestive of UTI.  Culture shows multiple bacteria suggesting contamination.  Imaging: completed head CT which was wnl.  Abd-pelvis CT was wnl--possible infection.  Precautions and every 15 minute checks   Discharge planning: Once stable the patient will be discharged back to her husband house. She will be scheduled to follow-up with [facility].  We have contacted the patient's husband twice and he has been updated about her treatment.  Medical Decision Making:  New problem, with additional work up planned     Jimmy Footman 08/31/2015, 1:25 PM

## 2015-08-31 NOTE — Progress Notes (Signed)
Observed in room at the start of the shift, isolative, withdrawn to room and not participating in group activities. Medication compliant, NEFM OLANZapine 10 mg Injection was not given, pleasant, smiley, "I have Golds and Diamond in my stomach, they compress my stomach at times...but when I go to the bathroom, I have regular poops."

## 2015-08-31 NOTE — Tx Team (Signed)
Interdisciplinary Treatment Plan Update (Adult)  Date:  08/31/2015 Time Reviewed:  4:31 PM  Progress in Treatment: Attending groups: No. Participating in groups:  No. Taking medication as prescribed:  Yes., but only because being forced at this time Tolerating medication:  Yes. Family/Significant othe contact made:  No, will contact:  Pt's husband Patient understands diagnosis:  No.No insight into her illness at this time. Discussing patient identified problems/goals with staff:  Yes. Medical problems stabilized or resolved:  No.  Denies suicidal/homicidal ideation: Yes. Issues/concerns per patient self-inventory:  Yes. Other:  New problem(s) identified: No, Describe:     Discharge Plan or Barriers: If remains minimally unimproved it may be necessary to refer to [facility].  If improved she will likely return to home with outpatient referral for med management.  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  Comments:Patient was lying in bed late this morning. Yesterday she only ate 10% of her lunch. She is demanding to be discharged today. She is a schedule for involuntary commitment hearing today, she has very little understanding of this procedure and says she will not attend if she is doesn't drive herself there. She also states that she spoke with the owner of the hospital and that she has been discharged. Later on this morning she was seeing being picked up by the [facility] to attend a court hearing, she appeared cooperative and calm. During assessment this morning patient stated that she no longer was having abdominal pain, nausea or vomiting. She denied having any physical complaints. She denies having side effects from medications and says that finally last night she was given the right medications. Patient's heart rate has been  about 100 consistently, just today she was started on metoprolol 12.5 mg by mouth twice a day. Patient thinks this is due to as giving her Adderall along with Ritalin and adrenaline shots.   Estimated length of stay: 7 days  New goal(s):  Review of initial/current patient goals per problem list:   See plan of care  Attendees: Patient:  Tricia Kerr 9/20/20164:31 PM  Family:   9/20/20164:31 PM  Physician:  Radene Journey 9/20/20164:31 PM  Nursing:    9/20/20164:31 PM  Case Manager:   9/20/20164:31 PM  Counselor:  Jake Shark, lCSW 9/20/20164:31 PM  Other:   9/20/20164:31 PM  Other:  Hershal Coria, LRT 9/20/20164:31 PM  Other:   9/20/20164:31 PM  Other:  9/20/20164:31 PM  Other:  9/20/20164:31 PM  Other:  9/20/20164:31 PM  Other:  9/20/20164:31 PM  Other:  9/20/20164:31 PM  Other:  9/20/20164:31 PM  Other:   9/20/20164:31 PM   Scribe for Treatment Team:   Glennon Mac, 08/31/2015, 4:31 PM

## 2015-08-31 NOTE — [facility] Group Notes (Signed)
[facility] Group Notes:  (Nursing/MHT/Case Management/Adjunct)  Date:  08/31/2015  Time:  2:01 PM  Type of Therapy:  Psychoeducational Skills  Participation Level:  Did Not Attend    Mickey Farber 08/31/2015, 2:01 PM

## 2015-08-31 NOTE — Progress Notes (Signed)
Data: Patient pulse rate elevated to 133 sitting this morning and 163 standing. Patient described presence of diamonds and gold in her abdomen stating she had multiple broken ribs. Patient stated "I was given a lethal injection in my thigh last night." Patient cooperative but suspicious. Patient sullen, disheveled and stayed in room for most of day. Patient attended court hearing to contest IVC. Patient returned and remained lethargic and sullen. Patient did not attend any groups and ate only 10% of her food. Patient states "I don't know why I'm still here..I want to leave." Patient describes being anxious. Denies SI/HI/AVH.  Action: Administered zyprexa and ativan this morning. Discussed importance of attending groups as part of her plan of care. Actively listened to patients concerns. Manuel pulse rate reassessment was 130. MD aware.  Response: Patient was compliant with all medication administrations. Patient indicated she would only talk to her Psychiatrist "Dr. Alycia Rossetti" and did not trust anyone else at the hospital. Patient continues to exhibit signs of delusional thinking. Will continue environmental rounds and q15 min. Checks.

## 2015-08-31 NOTE — Progress Notes (Signed)
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

## 2015-09-01 MED ORDER — ADULT MULTIVITAMIN W/MINERALS CH
1.0000 | ORAL_TABLET | Freq: Every day | ORAL | Status: DC
  Administered 2015-09-01 – 2015-09-09 (×9): 1 via ORAL
  Filled 2015-09-01 (×9): qty 1

## 2015-09-01 MED ORDER — ENSURE ENLIVE PO LIQD
237.0000 mL | Freq: Three times a day (TID) | ORAL | Status: DC
  Administered 2015-09-01 – 2015-09-08 (×26): 237 mL via ORAL

## 2015-09-01 MED ORDER — OLANZAPINE 5 MG PO TBDP: 20.0000 mg | ORAL_TABLET | Freq: Every day | ORAL | Status: DC

## 2015-09-01 MED ORDER — OLANZAPINE 5 MG PO TBDP
15.0000 mg | ORAL_TABLET | Freq: Once | ORAL | Status: AC
  Administered 2015-09-01: 15 mg via ORAL
  Filled 2015-09-01: qty 3

## 2015-09-01 MED ORDER — OLANZAPINE 5 MG PO TBDP: 30.0000 mg | ORAL_TABLET | Freq: Every day | ORAL | Status: DC

## 2015-09-01 NOTE — Progress Notes (Signed)
Recreation Therapy Notes  Date: 09.21.16 Time: 3:00 pm Location: Craft Room  Group Topic: Self-esteem  Goal Area(s) Addresses:  Patient will write at least one positive trait. Patient will verbalize benefit of having a good self-esteem.  Behavioral Response: Did not attend  Intervention: I Am  Activity: Patients were given a worksheet with the letter I on it and instructed to write as many positive traits about themselves inside the I.  Education: LRT educated patients on ways they can increase their self-esteem.   Education Outcome: Patient did not attend group.  Clinical Observations/Feedback: Patient did not attend group.  Jacquelynn Cree, LRT/CTRS 09/01/2015 4:41 PM

## 2015-09-01 NOTE — [facility] Suicide Risk Assessment (Signed)
[facility] INPATIENT:  Family/Significant Other Suicide Prevention Education  Suicide Prevention Education:  Education Completed: Tricia Kerr [phone]  has been identified by the patient as the family member/significant other with whom the patient will be residing, and identified as the person(s) who will aid the patient in the event of a mental health crisis (suicidal ideations/suicide attempt).  With written consent from the patient, the family member/significant other has been provided the following suicide prevention education, prior to the and/or following the discharge of the patient.  The suicide prevention education provided includes the following:  Suicide risk factors  Suicide prevention and interventions  National Suicide Hotline telephone number  [facility] assessment telephone number  [facility] Emergency Assistance 911  [facility] and/or Residential Mobile Crisis Unit telephone number  Request made of family/significant other to:  Remove weapons (e.g., guns, rifles, knives), all items previously/currently identified as safety concern.    Remove drugs/medications (over-the-counter, prescriptions, illicit drugs), all items previously/currently identified as a safety concern.  The family member/significant other verbalizes understanding of the suicide prevention education information provided.  The family member/significant other agrees to remove the items of safety concern listed above.  Daisy Floro Hyatt MSW, LCSWA  09/01/2015, 4:44 PM

## 2015-09-01 NOTE — [facility] Group Notes (Signed)
[facility] LCSW Aftercare Discharge Planning Group Note   09/01/2015 12:39 PM  Participation Quality:  Patient did not attend group.   Lulu Riding, MSW, LCSWA

## 2015-09-01 NOTE — Plan of Care (Signed)
Problem: Consults Goal: Depression Patient Education See Patient Education Module for education specifics.  Outcome: Progressing Patient states that she is less depressed today and not having any SI or HI at this time.

## 2015-09-01 NOTE — [facility] Group Notes (Signed)
[facility] Group Notes:  (Nursing/MHT/Case Management/Adjunct)  Date:  09/01/2015  Time:  5:38 PM  Type of Therapy:  Psychoeducational Skills  Participation Level:  Did Not Attend   Lynelle Smoke [facility] 09/01/2015, 5:38 PM

## 2015-09-01 NOTE — Plan of Care (Signed)
Problem: Ineffective individual coping Goal: STG: Patient will remain free from self harm Outcome: Progressing Medications administered as ordered by the physician, no PRN given, HR reassessed, VSS, 15 minute checks maintained for safety, clinical and moral support provided, patient encouraged to continue to express feelings and demonstrate safe care. Patient remain free from harm, will continue to monitor.

## 2015-09-01 NOTE — Progress Notes (Signed)
D: patient stayed in room most of this shift.  Patient states depression has decreased. Patient not experiencing any SI or HI at this time.  Patient pleasant and wearing scrubs.  Patient not participating in any groups.  Patient compliant with medications.  Patient in no distress at this time.   A: encouragement and support given.  Gave medications as prescribed. R: patient receptive of information

## 2015-09-01 NOTE — Progress Notes (Signed)
[facility] MD Progress Note  09/01/2015 3:59 PM Tricia Kerr  MRN:  [id number] Subjective:  Patient was lying in bed late this morning. She continues to have very poor oral intake per nursing she drank half of her ensure this morning and a full bottle during lunchtime but did not touch her meals.  Patient will not explain as to why she is not eating and she just said she is full. I have ordered a paranoid training and I have instructed the nurses to give her Gatorade 3 times a day and nutritional checks 4 times a day.  Multivitamins have been ordered as well.  Despite patient's compliance with Zyprexa 30 mg dose she does not seem to have improved much since admission. She continues to be very delusional and delusions continued to be bizarre.  Patient also continues to take on and off Zofran for nausea. At this point in time I believe that perhaps they olanzapine is what is causing the GI problems. Today I will change the olanzapine to only daily at bedtime and I will decrease the dose to 20 mg. Patient continues to have problems with vomiting and no improvement of psychiatric condition we will have to consider switching medications. I discussed the case with social worker today who tells me she spoke with the patient's husband on Sunday. The patient's husband told her that they smoke marijuana on a daily basis he also reported that the patient had taken at least 10 tablets of a stimulant which he thinks was ritalin.  Patient's husband stated that one of his coworkers left the bottle of the stimulant in his car and the patient took the pills from there.  Patient's husband only disclosed this information after the social worker told him that there was not one of the any legal implication.  Heart rate continues to be elevated. Today was 108 manually and yesterday 96.  Per nursing D: patient stayed in room most of this shift. Patient states depression has decreased. Patient not experiencing any SI or HI at this  time. Patient pleasant and wearing scrubs. Patient not participating in any groups. Patient compliant with medications. Patient in no distress at this time.  A: encouragement and support given. Gave medications as prescribed. R: patient receptive of information          Principal Problem: Schizophreniform disorder Diagnosis:   Patient Active Problem List   Diagnosis Date Noted  . 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 Tobacco use disorder [Z72.0] 08/23/2015  . Cannabis use disorder, severe, dependence [F12.20] 08/23/2015   Total Time spent with patient: 30 minutes   Past Medical History:  Past Medical History  Diagnosis Date  . Anxiety   . Bipolar disorder     Past Surgical History  Procedure Laterality Date  . Appendectomy    . Tubial    . Tubal ligation     Family History: History reviewed. No pertinent family history. Social History:  History  Alcohol Use  . Yes     History  Drug Use  . 3.00 per week  . Special: Marijuana    Social History   Social History  . Marital Status: Single    Spouse Name: N/A  . Number of Children: N/A  . Years of Education: N/A   Social History Main Topics  . Smoking status: Current Every Day Smoker -- 1.00 packs/day    Types: Cigarettes  . Smokeless tobacco: Never Used  . Alcohol Use: Yes  . Drug Use: 3.00 per week  Special: Marijuana  . Sexual Activity: Not Currently   Other Topics Concern  . None   Social History Narrative   Additional History:    Sleep: Good  Appetite:  Poor    Assessment:   Musculoskeletal: Strength & Muscle Tone: within normal limits Gait & Station: normal Patient leans: N/A   Psychiatric Specialty Exam: Physical Exam  Constitutional: She is oriented to person, place, and time. She appears well-developed and well-nourished. No distress.  HENT:  Head: Normocephalic and atraumatic.  Respiratory: Breath sounds normal.  Neurological: She is alert and oriented to  person, place, and time.  Skin: She is not diaphoretic.    Review of Systems  Constitutional: Negative.   HENT: Negative.   Eyes: Negative.   Respiratory: Negative.  Negative for shortness of breath.   Cardiovascular: Negative.  Negative for palpitations.  Gastrointestinal: Negative.  Negative for nausea, vomiting and abdominal pain.  Genitourinary: Negative.   Musculoskeletal: Negative.  Negative for back pain.  Skin: Negative.   Neurological: Negative.  Negative for weakness.  Endo/Heme/Allergies: Negative.   Psychiatric/Behavioral: Negative.     Blood pressure 118/87, pulse 137, temperature 99 F (37.2 C), temperature source Oral, resp. rate 20, height 5' (1.524 m), weight 44.453 kg (98 lb), SpO2 99 %.Body mass index is 19.14 kg/(m^2).  General Appearance: Fairly Groomed  Patent attorney::  Good  Speech:  Normal Rate  Volume:  Normal  Mood:  Irritable  Affect:  Congruent  Thought Process:  Disorganized  Orientation:  Full (Time, Place, and Person)  Thought Content:  Delusions and Paranoid Ideation  Suicidal Thoughts:  No  Homicidal Thoughts:  No  Memory:  Immediate;   Fair Recent;   Fair Remote;   Fair  Judgement:  Impaired  Insight:  Lacking  Psychomotor Activity:  Normal  Concentration:  NA  Recall:  NA  Fund of Knowledge:Fair  Language: Good  Akathisia:  No  Handed:    AIMS (if indicated):     Assets:  Financial Resources/Insurance Housing Social Support  ADL's:  Intact  Cognition: WNL  Sleep:  Number of Hours: 7.45     Current Medications: Current Facility-Administered Medications  Medication Dose Route Frequency Provider Last Rate Last Dose  . acetaminophen (TYLENOL) tablet 650 mg  650 mg Oral Q6H PRN Beau Fanny, MD      . alum & mag hydroxide-simeth (MAALOX/MYLANTA) 200-200-20 MG/5ML suspension 30 mL  30 mL Oral Q4H PRN Beau Fanny, MD      . feeding supplement (ENSURE ENLIVE) (ENSURE ENLIVE) liquid 237 mL  237 mL Oral TID PC & HS Jimmy Footman, MD   237 mL at 09/01/15 1241  . ibuprofen (ADVIL,MOTRIN) tablet 600 mg  600 mg Oral Q6H PRN Jimmy Footman, MD      . LORazepam (ATIVAN) tablet 0.5 mg  0.5 mg Oral BID Jimmy Footman, MD   0.5 mg at 09/01/15 4098  . LORazepam (ATIVAN) tablet 2 mg  2 mg Oral Q6H PRN Jimmy Footman, MD   2 mg at 08/25/15 1449  . magnesium hydroxide (MILK OF MAGNESIA) suspension 30 mL  30 mL Oral Daily PRN Beau Fanny, MD      . metoprolol tartrate (LOPRESSOR) tablet 12.5 mg  12.5 mg Oral BID Jimmy Footman, MD   12.5 mg at 09/01/15 1191  . multivitamin with minerals tablet 1 tablet  1 tablet Oral Daily Jimmy Footman, MD   1 tablet at 09/01/15 [phone]  . OLANZapine zydis (ZYPREXA) disintegrating tablet 15  mg  15 mg Oral Once Jimmy Footman, MD      . Melene Muller ON 09/02/2015] OLANZapine zydis (ZYPREXA) disintegrating tablet 20 mg  20 mg Oral QHS Jimmy Footman, MD      . ondansetron (ZOFRAN-ODT) disintegrating tablet 8 mg  8 mg Oral Q8H PRN Jimmy Footman, MD   8 mg at 09/01/15 1120    Lab Results:  No results found for this or any previous visit (from the past 48 hour(s)).  Physical Findings: AIMS: Facial and Oral Movements Muscles of Facial Expression: None, normal Lips and Perioral Area: None, normal Jaw: None, normal Tongue: None, normal,Extremity Movements Upper (arms, wrists, hands, fingers): None, normal Lower (legs, knees, ankles, toes): None, normal, Trunk Movements Neck, shoulders, hips: None, normal, Overall Severity Severity of abnormal movements (highest score from questions above): None, normal Incapacitation due to abnormal movements: None, normal Patient's awareness of abnormal movements (rate only patient's report): Aware, no distress, Dental Status Current problems with teeth and/or dentures?: No Does patient usually wear dentures?: No  CIWA:  CIWA-Ar Total: 6 COWS:     Treatment Plan  Summary: Daily contact with patient to assess and evaluate symptoms and progress in treatment and Medication management   29 y/o wf with new onset psychosis. According to the emergency department on the involuntary commitment by her family as she has been very disorganized for the last 5 weeks. Over this past week and the patient was becoming agitated and aggressive.  Unspecified psychotic disorder/schizophreniform disorder: Continue on olanzapine but will change the dose to 20 mg by mouth daily at bedtime  (zydis).  Patient has been compliant with oral medications for the last several days therefore I will discontinue non-emergency forced medications.  Patient does not appear to be improving and seems to be having nausea and vomiting from Zyprexa. We'll see her response to a lower dose may consider switching medications in the next few days  Agitation: I will order Ativan 2 mg by mouth every 6 hours as needed  Anxiety: continue ativan 0.5 mg po bid  Tobacco use disorder: Patient states she does not need the gum and claims to be allergic to the nicotine patch  Vomiting/abdominal pain/weakness: appears all was secondary to UTI.  Abd and pelvis CT wnl. UA suggest UTI and WBC elevated.  No fever and other VS were wnl.  Seen by hospitalist last week.  No longer c/o pain/vomiting/nausea.   UTI: completed rocephin.  Denies symptoms.  Labs: TSH and ammonia wnl, B12 wnl, RPR -, HIV -.  Labs repeated today CBC WBC elevated, CMP wnl. UA suggestive of UTI.  Culture shows multiple bacteria suggesting contamination.  Imaging: completed head CT which was wnl.  Abd-pelvis CT was wnl--possible infection.  Precautions and every 15 minute checks   Discharge planning: Once stable the patient will be discharged back to her husband house. She will be scheduled to follow-up with [facility].  We have contacted the patient's husband twice and he has been updated about her treatment.  Medical Decision Making:  New  problem, with additional work up planned     Jimmy Footman 09/01/2015, 3:59 PM

## 2015-09-01 NOTE — [facility] Group Notes (Signed)
[facility] LCSW Group Therapy  09/01/2015 12:39 PM  Type of Therapy:  Group Therapy  Participation Level:  Did Not Attend   Lulu Riding, MSW, LCSWA 09/01/2015, 12:39 PM

## 2015-09-01 NOTE — [facility] Group Notes (Signed)
[facility] LCSW Group Therapy  09/01/2015 4:21 PM  Type of Therapy:  Group Therapy  Participation Level:  Did Not Attend  Modes of Intervention:  Discussion, Education, Socialization and Support  Summary of Progress/Problems: Pt will identify unhealthy thoughts and how they impact their emotions and behavior. Pt will be encouraged to discuss these thoughts, emotions and behaviors with the group.   Candace L Hyatt MSW, LCSWA  09/01/2015, 4:21 PM  

## 2015-09-02 MED ORDER — RISPERIDONE 1 MG PO TBDP
2.0000 mg | ORAL_TABLET | Freq: Two times a day (BID) | ORAL | Status: DC
  Administered 2015-09-02 – 2015-09-06 (×9): 2 mg via ORAL
  Filled 2015-09-02 (×9): qty 2

## 2015-09-02 NOTE — Progress Notes (Signed)
Presents with sad affect.  Denies SI.  Medicated for nausea x1  With good effects.  Medicated for pain x1 with good effects.  Quiet and reserved.  Safety maintained.

## 2015-09-02 NOTE — Plan of Care (Signed)
Problem: Alteration in mood; excessive anxiety as evidenced by: Goal: LTG-Patient's behavior demonstrates decreased anxiety (Patient's behavior demonstrates anxiety and he/she is utilizing learned coping skills to deal with anxiety-producing situations)  Outcome: Progressing Patient is interacting appropriately with peers and staff.      

## 2015-09-02 NOTE — [facility] Group Notes (Signed)
[facility] LCSW Group Therapy  09/02/2015 3:44 PM  Type of Therapy:  Group Therapy  Participation Level:  Did Not Attend   Lulu Riding, MSW, LCSWA 09/02/2015, 3:44 PM

## 2015-09-02 NOTE — Plan of Care (Signed)
Problem: Alteration in mood Goal: LTG-Pt's behavior demonstrates decreased signs of depression (Patient's behavior demonstrates decreased signs of depression to the point the patient is safe to return home and continue treatment in an outpatient setting)  Outcome: Not Progressing Continues to look sad and depressed.

## 2015-09-02 NOTE — Progress Notes (Signed)
Recreation Therapy Notes  Date: 09.22.16 Time: 3:00 pm Location: Craft Room  Group Topic: Leisure Education  Goal Area(s) Addresses:  Patient will identify activities for each letter of the alphabet. Patient will verbalize ability to integrate positive leisure into life post d/c. Patient will verbalize ability to use leisure as a Associate Professor.  Behavioral Response: Did not attend  Intervention: Leisure Alphabet  Activity: Patients were given a leisure alphabet worksheet and instructed to list healthy leisure activities for each letter of the alphabet.  Education: LRT educated patients on what they needed to participate in leisure activities.   Education Outcome: Patient did not attend group.  Clinical Observations/Feedback: Patient did not attend group.  Jacquelynn Cree, LRT/CTRS 09/02/2015 4:44 PM

## 2015-09-02 NOTE — Progress Notes (Signed)
D: Pt denies SI/HI/AVH. Pt is pleasant and cooperative. Mood is pleasant and she brightens upon approach.  Pt stated she feels better from taking medication. Patient appears less anxious and she is interacting with peers and staff appropriately.  A: Pt was offered support and encouragement. Pt was given scheduled medications. Pt was encouraged to attend groups. Q 15 minute checks were done for safety.  R: Pt did not attend group but was present for snack time. Patient interacted appropriately with peers and staff. Pt is taking medication. Pt has no complaints.Pt receptive to treatment and safety maintained on unit.

## 2015-09-02 NOTE — Progress Notes (Signed)
[facility] MD Progress Note  09/02/2015 11:41 AM Tricia Kerr  MRN:  [id number] Subjective:  Patient was vomiting earlier this morning. The patient continues to have minimal oral intake only drinking Gatorades and ensures.  At this point we felt that most likely did not show and vomiting has been secondary to the olanzapine and therefore this medication will be discontinued.  Patient will be started on Risperdal 2 mg by mouth twice a day. As of this morning patient continues to be very delusional the content of the delusions continues to be about having diamonds and her stomach and wiring her chest, very bizarre content. She continues to call her husband  her ex-husband.    She seems calmer, less argumentative and is able to have a logical conversation for a couple of minutes, however very quickly with delusional thinking becomes evident.  She continues to ask for request and says the discharge paperwork has already been completed that I just need to go and look at her chart.  Per SW: she spoke with the patient's husband on Sunday. The patient's husband told her that they smoke marijuana on a daily basis he also reported that the patient had taken at least 10 tablets of a stimulant which he thinks was ritalin.  Patient's husband stated that one of his coworkers left the bottle of the stimulant in his car and the patient took the pills from there.  Patient's husband only disclosed this information after the social worker told him that there was not one of the any legal implication.   Per nursing D: Pt denies SI/HI/AVH. Pt is pleasant and cooperative. Mood is pleasant and she brightens upon approach. Pt stated she feels better from taking medication. Patient appears less anxious and she is interacting with peers and staff appropriately.  A: Pt was offered support and encouragement. Pt was given scheduled medications. Pt was encouraged to attend groups. Q 15 minute checks were done for safety.  R: Pt did not  attend group but was present for snack time. Patient interacted appropriately with peers and staff. Pt is taking medication. Pt has no complaints.Pt receptive to treatment and safety maintained on unit.  Principal Problem: Schizophreniform disorder Diagnosis:   Patient Active Problem List   Diagnosis Date Noted  . 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 Tobacco use disorder [Z72.0] 08/23/2015  . Cannabis use disorder, severe, dependence [F12.20] 08/23/2015   Total Time spent with patient: 30 minutes   Past Medical History:  Past Medical History  Diagnosis Date  . Anxiety   . Bipolar disorder     Past Surgical History  Procedure Laterality Date  . Appendectomy    . Tubial    . Tubal ligation     Family History: History reviewed. No pertinent family history. Social History:  History  Alcohol Use  . Yes     History  Drug Use  . 3.00 per week  . Special: Marijuana    Social History   Social History  . Marital Status: Single    Spouse Name: N/A  . Number of Children: N/A  . Years of Education: N/A   Social History Main Topics  . Smoking status: Current Every Day Smoker -- 1.00 packs/day    Types: Cigarettes  . Smokeless tobacco: Never Used  . Alcohol Use: Yes  . Drug Use: 3.00 per week    Special: Marijuana  . Sexual Activity: Not Currently   Other Topics Concern  . None   Social History Narrative  Additional History:    Sleep: Good  Appetite:  Poor    Assessment:   Musculoskeletal: Strength & Muscle Tone: within normal limits Gait & Station: normal Patient leans: N/A   Psychiatric Specialty Exam: Physical Exam  Constitutional: She is oriented to person, place, and time. She appears well-developed and well-nourished. No distress.  HENT:  Head: Normocephalic and atraumatic.  Respiratory: Breath sounds normal.  Neurological: She is alert and oriented to person, place, and time.  Skin: She is not diaphoretic.    Review of Systems   Constitutional: Negative.   HENT: Negative.   Eyes: Negative.   Respiratory: Negative.  Negative for shortness of breath.   Cardiovascular: Negative.  Negative for palpitations.  Gastrointestinal: Positive for nausea and vomiting. Negative for abdominal pain.  Genitourinary: Negative.   Musculoskeletal: Negative.  Negative for back pain.  Skin: Negative.   Neurological: Negative.  Negative for weakness.  Endo/Heme/Allergies: Negative.   Psychiatric/Behavioral: Negative.     Blood pressure 107/80, pulse 102, temperature 98 F (36.7 C), temperature source Oral, resp. rate 20, height 5' (1.524 m), weight 44.453 kg (98 lb), SpO2 99 %.Body mass index is 19.14 kg/(m^2).  General Appearance: Fairly Groomed  Patent attorney::  Good  Speech:  Normal Rate  Volume:  Normal  Mood:  Irritable  Affect:  Congruent  Thought Process:  Disorganized  Orientation:  Full (Time, Place, and Person)  Thought Content:  Delusions and Paranoid Ideation  Suicidal Thoughts:  No  Homicidal Thoughts:  No  Memory:  Immediate;   Fair Recent;   Fair Remote;   Fair  Judgement:  Impaired  Insight:  Lacking  Psychomotor Activity:  Normal  Concentration:  NA  Recall:  NA  Fund of Knowledge:Fair  Language: Good  Akathisia:  No  Handed:    AIMS (if indicated):     Assets:  Financial Resources/Insurance Housing Social Support  ADL's:  Intact  Cognition: WNL  Sleep:  Number of Hours: 7     Current Medications: Current Facility-Administered Medications  Medication Dose Route Frequency Tricia Kerr Last Rate Last Dose  . acetaminophen (TYLENOL) tablet 650 mg  650 mg Oral Q6H PRN Beau Fanny, MD      . alum & mag hydroxide-simeth (MAALOX/MYLANTA) 200-200-20 MG/5ML suspension 30 mL  30 mL Oral Q4H PRN Beau Fanny, MD      . feeding supplement (ENSURE ENLIVE) (ENSURE ENLIVE) liquid 237 mL  237 mL Oral TID PC & HS Jimmy Footman, MD   237 mL at 09/02/15 0900  . ibuprofen (ADVIL,MOTRIN) tablet 600 mg   600 mg Oral Q6H PRN Jimmy Footman, MD      . LORazepam (ATIVAN) tablet 0.5 mg  0.5 mg Oral BID Jimmy Footman, MD   0.5 mg at 09/02/15 0919  . LORazepam (ATIVAN) tablet 2 mg  2 mg Oral Q6H PRN Jimmy Footman, MD   2 mg at 08/25/15 1449  . magnesium hydroxide (MILK OF MAGNESIA) suspension 30 mL  30 mL Oral Daily PRN Beau Fanny, MD      . metoprolol tartrate (LOPRESSOR) tablet 12.5 mg  12.5 mg Oral BID Jimmy Footman, MD   12.5 mg at 09/02/15 0919  . multivitamin with minerals tablet 1 tablet  1 tablet Oral Daily Jimmy Footman, MD   1 tablet at 09/02/15 0919  . ondansetron (ZOFRAN-ODT) disintegrating tablet 8 mg  8 mg Oral Q8H PRN Jimmy Footman, MD   8 mg at 09/02/15 0919  . risperiDONE (RISPERDAL M-TABS) disintegrating tablet  2 mg  2 mg Oral BID Jimmy Footman, MD        Lab Results:  No results found for this or any previous visit (from the past 48 hour(s)).  Physical Findings: AIMS: Facial and Oral Movements Muscles of Facial Expression: None, normal Lips and Perioral Area: None, normal Jaw: None, normal Tongue: None, normal,Extremity Movements Upper (arms, wrists, hands, fingers): None, normal Lower (legs, knees, ankles, toes): None, normal, Trunk Movements Neck, shoulders, hips: None, normal, Overall Severity Severity of abnormal movements (highest score from questions above): None, normal Incapacitation due to abnormal movements: None, normal Patient's awareness of abnormal movements (rate only patient's report): Aware, no distress, Dental Status Current problems with teeth and/or dentures?: No Does patient usually wear dentures?: No  CIWA:  CIWA-Ar Total: 6 COWS:     Treatment Plan Summary: Daily contact with patient to assess and evaluate symptoms and progress in treatment and Medication management   29 y/o wf with new onset psychosis. According to the emergency department on the involuntary  commitment by her family as she has been very disorganized for the last 5 weeks. Over this past week and the patient was becoming agitated and aggressive.  Unspecified psychotic disorder/schizophreniform disorder: Patient continues to have severe nausea and vomiting which appears to be secondary to the treatment with olanzapine. I will discontinue this medication and now start her on Risperdal 2 mg by mouth twice a day.  Patient continues to be delusional and since admission there is only minimal improvement.  If vomiting continued over the last 24 hours will order an abdominal ultrasound.  Agitation: I will order Ativan 2 mg by mouth every 6 hours as needed  Anxiety: continue ativan 0.5 mg po bid  Tobacco use disorder: Patient states she does not need the gum and claims to be allergic to the nicotine patch  Vomiting/abdominal pain/weakness: appears all was secondary to UTI.  Abd and pelvis CT wnl. UA suggest UTI and WBC elevated.  No fever and other VS were wnl.  Seen by hospitalist last week.   UTI: completed rocephin.  Denies symptoms.  Labs: TSH and ammonia wnl, B12 wnl, RPR -, HIV -.  Labs repeated today CBC WBC elevated, CMP wnl. UA suggestive of UTI.  Culture shows multiple bacteria suggesting contamination.  Imaging: completed head CT which was wnl.  Abd-pelvis CT was wnl--possible infection.  Precautions and every 15 minute checks   Discharge planning: Once stable the patient will be discharged back to her husband house. She will be scheduled to follow-up with [facility].  We have contacted the patient's husband twice and he has been updated about her treatment.  Medical Decision Making:  New problem, with additional work up planned     Jimmy Footman 09/02/2015, 11:41 AM

## 2015-09-03 LAB — COMPREHENSIVE METABOLIC PANEL
ALK PHOS: 67 U/L (ref 38–126)
ALT: 24 U/L (ref 14–54)
AST: 32 U/L (ref 15–41)
Albumin: 4.1 g/dL (ref 3.5–5.0)
Anion gap: 10 (ref 5–15)
BILIRUBIN TOTAL: 1 mg/dL (ref 0.3–1.2)
BUN: 20 mg/dL (ref 6–20)
CALCIUM: 9.5 mg/dL (ref 8.9–10.3)
CO2: 34 mmol/L — ABNORMAL HIGH (ref 22–32)
CREATININE: 0.81 mg/dL (ref 0.44–1.00)
Chloride: 92 mmol/L — ABNORMAL LOW (ref 101–111)
Glucose, Bld: 144 mg/dL — ABNORMAL HIGH (ref 65–99)
Potassium: 3.4 mmol/L — ABNORMAL LOW (ref 3.5–5.1)
Sodium: 136 mmol/L (ref 135–145)
TOTAL PROTEIN: 7.1 g/dL (ref 6.5–8.1)

## 2015-09-03 LAB — URINALYSIS COMPLETE WITH MICROSCOPIC (ARMC ONLY)
BILIRUBIN URINE: NEGATIVE
Bacteria, UA: NONE SEEN
GLUCOSE, UA: 150 mg/dL — AB
Hgb urine dipstick: NEGATIVE
KETONES UR: NEGATIVE mg/dL
Leukocytes, UA: NEGATIVE
Nitrite: NEGATIVE
PROTEIN: NEGATIVE mg/dL
SPECIFIC GRAVITY, URINE: 1.009 (ref 1.005–1.030)
pH: 7 (ref 5.0–8.0)

## 2015-09-03 LAB — CBC
HEMATOCRIT: 42.9 % (ref 35.0–47.0)
Hemoglobin: 14.9 g/dL (ref 12.0–16.0)
MCH: 30.6 pg (ref 26.0–34.0)
MCHC: 34.8 g/dL (ref 32.0–36.0)
MCV: 87.9 fL (ref 80.0–100.0)
PLATELETS: 251 10*3/uL (ref 150–440)
RBC: 4.88 MIL/uL (ref 3.80–5.20)
RDW: 12.9 % (ref 11.5–14.5)
WBC: 13.6 10*3/uL — AB (ref 3.6–11.0)

## 2015-09-03 LAB — GAMMA GT: GGT: 35 U/L (ref 7–50)

## 2015-09-03 LAB — AMYLASE: AMYLASE: 69 U/L (ref 28–100)

## 2015-09-03 MED ORDER — POTASSIUM CHLORIDE CRYS ER 20 MEQ PO TBCR
20.0000 meq | EXTENDED_RELEASE_TABLET | Freq: Two times a day (BID) | ORAL | Status: AC
  Administered 2015-09-03 – 2015-09-04 (×4): 20 meq via ORAL
  Filled 2015-09-03 (×7): qty 1

## 2015-09-03 NOTE — Tx Team (Signed)
Interdisciplinary Treatment Plan Update (Adult)  Date:  09/03/2015 Time Reviewed:  4:07 PM  Progress in Treatment: Attending groups: Yes. Participating in groups:  Yes. Taking medication as prescribed:  No. and As evidenced by:  refusing medications  Tolerating medication:  Yes. Family/Significant othe contact made:  Yes, individual(s) contacted:  Husband Patient understands diagnosis:  No. and As evidenced by:  Limited insight  Discussing patient identified problems/goals with staff:  Yes. Medical problems stabilized or resolved:  Yes. Denies suicidal/homicidal ideation: Yes. Issues/concerns per patient self-inventory:  No. Other:  New problem(s) identified: No, Describe:  NA  Discharge Plan or Barriers: Pt plans to return home and follow up with [facility].   Reason for Continuation of Hospitalization: Delusions  Medication stabilization  Comments:Patient appears improved this morning in the sense that she is not longer vomiting or reporting nausea. Her oral intake continues to be poor. The patient continues to be withdrawn but once in a while he seen attending Group. She is more pleasant and at times appears logical and less argumentative. However every time that she asked for discharge she becomes again delusional and agitated. Today the patient stated that her ex-husband [place] is not taking care of her 75-year-old and therefore she needs to leave the hospital. The patient states she is being held here for no reason and she will file charges for kidnapping. Patient threatened with not eating or doing anything this weekend if not discharged today. We spoke with her husband yesterday with reports that she continues to get agitated every time he comes to visit. He reports that the delusions continuing she is not much improved. Referral to [facility] was made on 09/02/2015.   Estimated length of stay: 5-7 days   New goal(s): NA   Review of initial/current patient goals per problem list:   1.   Goal(s): Patient will participate in aftercare plan * Met:  * Target date: at discharge * As evidenced by: Patient will participate within aftercare plan AEB aftercare provider and housing plan at discharge being identified.  2.  Goal (s): Patient will demonstrate decreased symptoms of psychosis. * Met: No  *  Target date: at discharge * As evidenced by: Patient will not endorse signs of psychosis or be deemed stable for discharge by MD.   Attendees: Patient:  Tricia Kerr 9/23/20164:07 PM  Family:   9/23/20164:07 PM  Physician:  Dr. Jerilee Hoh 9/23/20164:07 PM  Nursing:   Junita Push, RN  9/23/20164:07 PM  Case Manager:   9/23/20164:07 PM  Counselor:   9/23/20164:07 PM  Other:  Wray Kearns, LCSWA  9/23/20164:07 PM  Other:  Everitt Amber, [place]  9/23/20164:07 PM  Other:   9/23/20164:07 PM  Other:  9/23/20164:07 PM  Other:  9/23/20164:07 PM  Other:  9/23/20164:07 PM  Other:  9/23/20164:07 PM  Other:  9/23/20164:07 PM  Other:  9/23/20164:07 PM  Other:   9/23/20164:07 PM   Scribe for Treatment Team:   Wray Kearns MSW, [place] , 09/03/2015, 4:07 PM

## 2015-09-03 NOTE — [facility] Group Notes (Signed)
[facility] LCSW Aftercare Discharge Planning Group Note   09/03/2015 7:02 PM  Participation Quality:  Minimal   Mood/Affect:  Not Congruent  Depression Rating:  0  Anxiety Rating:  0  Thoughts of Suicide:  No Will you contract for safety?   NA  Current AVH:  No  Plan for Discharge/Comments:  Tricia Kerr states she wants to discharge today. She states she wants to see her children and that she is concerned her "baby daddy" is not taking care of them. She did not directly express any delusions other than she is no longer married.   Transportation Means: family   Supports: family   Garment/textile technologist MSW, [address]

## 2015-09-03 NOTE — Progress Notes (Signed)
Patient ID: Tricia Kerr, female   DOB: Dec 02, 1986, 29 y.o.   MRN: [id number]  CSW attempted to complete assessment. Pt was unable to participate due to severity of delusions. As of today, pt is on the waiting list of [facility].   Rondall Allegra MSW, [facility]  09/03/2015 6:41 PM

## 2015-09-03 NOTE — Plan of Care (Signed)
Problem: Alteration in mood Goal: LTG-Pt's behavior demonstrates decreased signs of depression (Patient's behavior demonstrates decreased signs of depression to the point the patient is safe to return home and continue treatment in an outpatient setting)  Outcome: Progressing Patient has been moving outside of the room more and attending groups more often.

## 2015-09-03 NOTE — Progress Notes (Signed)
Recreation Therapy Notes  Date: 09.23.16 Time: 3:00 pm Location: Craft Room  Group Topic: Problem solving, communication, teamwork  Goal Area(s) Addresses:  Patient will effectively work with peer towards shared goal. Patient will identify skills used to make activity successful. Patient will identify benefit of using group skills effectively post d/c.  Behavioral Response: Did not attend  Intervention: [employer]  Activity: Patients were divided up into two groups. Patients were instructed to build a free standing tower out of 15 pipe cleaners. After about 5 minutes of patients building, patients were instructed to put their dominant hand behind their back. After another 5 minutes, patients were instructed not to talk to each other.  Education: LRT educated patients on the importance of communication, problem solving, and teamwork   Education Outcome: Patient did not attend group.  Clinical Observations/Feedback: Patient did not attend group.  Jacquelynn Cree, LRT/CTRS 09/03/2015 4:47 PM

## 2015-09-03 NOTE — Progress Notes (Signed)
Nutrition Follow-up    INTERVENTION:   Meals and Snacks: Cater to patient preferences, continue paranoid trays Medical Food Supplement Therapy: continue Ensure Enlive po QID, each supplement provides 350 kcal and 20 grams of protein  NUTRITION DIAGNOSIS:   Inadequate oral intake related to acute illness (altered cognition) as evidenced by meal completion < 25%. Continues but being addressed as pt on regular diet, receiving supplement, paranoid trays, MVI  GOAL:   Patient will meet greater than or equal to 90% of their needs   MONITOR:    (Energy Intake, Anthropometrics, Cogntion, Electrolyte/Renal profile, Digestive System)  REASON FOR ASSESSMENT:   LOS    ASSESSMENT:   Diet Order:  Diet regular Room service appropriate?: Yes; Fluid consistency:: Thin   Energy Intake: recorded po intake 15% of meals on average, receiving Ensure QID, receiving paranoid tray. MD is aware of poor oral intake  Electrolyte and Renal Profile:  Recent Labs Lab 08/28/15 0555 09/03/15 1020  BUN 23* 20  CREATININE 0.83 0.81  NA 138 136  K 3.9 3.4*   Glucose Profile: No results for input(s): GLUCAP in the last 72 hours.   Meds: MVI  Height:   Ht Readings from Last 1 Encounters:  08/23/15 5' (1.524 m)    Weight: 7.5% wt loss  Wt Readings from Last 1 Encounters:  08/30/15 98 lb (44.453 kg)    Filed Weights   08/23/15 1448 08/30/15 2029  Weight: 106 lb (48.081 kg) 98 lb (44.453 kg)    BMI:  Body mass index is 19.14 kg/(m^2).  Estimated Nutritional Needs:   Kcal:  1440-1680 kcals   Protein:  38-48 g  Fluid:  1440-1680 mL  MODERATE Care Level  Romelle Starcher MS, RD, LDN [phone] Pager

## 2015-09-03 NOTE — Progress Notes (Signed)
D: Pt denies SI/HI/AVH. Patient's affect is flat and sad but brightens upon approach. Patient does not intiate a conversation, but she appears less anxious and she is interacting with peers and staff appropriately. Patient's  thoughts are organized and speech is logical/coherent.  A: Pt was offered support and encouragement. Pt was given scheduled medications. Pt was encouraged to attend groups. Q 15 minute checks were done for safety.  R:Pt attends groups and interacts appropriately with peers and staff. Pt is compliant with medication. Patient is  receptive to treatment and safety maintained on unit.

## 2015-09-03 NOTE — [facility] Group Notes (Signed)
[facility] Group Notes:  (Nursing/MHT/Case Management/Adjunct)  Date:  09/03/2015  Time:  12:00 PM  Type of Therapy:  Psychoeducational Skills  Participation Level:  None  Participation Quality:  Attentive  Affect:  Appropriate  Cognitive:  Appropriate  Insight:  Appropriate  Engagement in Group:  None  Modes of Intervention:  Socialization  Summary of Progress/Problems:  Tricia Kerr 09/03/2015, 12:00 PM

## 2015-09-03 NOTE — Progress Notes (Signed)
D: patient presenting with a flat and depressed affect for most of this shift.  Patient denies any SI or HI at this time.  Patient upset after doctor spoke with patient and told patient she would not be leaving.  Gave patient PRN ativan.  Patient attended one group this shift.  Patient compliant with medications prescribed and complaint with treatment. Patient in no distress at this time.    A: encouragement and support given.  Medications given as prescribed.   R: patient receptive of information and care/ treatment given.

## 2015-09-03 NOTE — Plan of Care (Signed)
Problem: Alteration in mood Goal: STG-Patient is able to discuss feelings and issues (Patient is able to discuss feelings and issues leading to depression)  Outcome: Progressing Patient discussed feelings with staff.

## 2015-09-03 NOTE — Progress Notes (Signed)
[facility] MD Progress Note  09/03/2015 9:42 AM Tricia Kerr  MRN:  [id number] Subjective:  Patient appears improved this morning in the sense that she is not longer vomiting or reporting nausea. Her oral intake continues to be poor. The patient continues to be withdrawn but once in a while he seen attending  Group.  She is more pleasant and at times appears logical and less argumentative. However every time that she asked for discharge she becomes again delusional and agitated. Today the patient stated that her ex-husband Tricia Kerr is not taking care of her 28-year-old and therefore she needs to leave the hospital. The patient states she is being held here for no reason and she will file charges for kidnapping. Patient threatened with not eating or doing anything this weekend if not discharged today.  We spoke with her husband yesterday with reports that she continues to get agitated every time he comes to visit. He reports that the delusions continuing she is not much improved   Per nursing D: Pt denies SI/HI/AVH. Patient's affect is flat and sad but brightens upon approach. Patient does not intiate a conversation, but she appears less anxious and she is interacting with peers and staff appropriately. Patient's thoughts are organized and speech is logical/coherent.  A: Pt was offered support and encouragement. Pt was given scheduled medications. Pt was encouraged to attend groups. Q 15 minute checks were done for safety.  R:Pt attends groups and interacts appropriately with peers and staff. Pt is compliant with medication. Patient is receptive to treatment and safety maintained on unit.  Principal Problem: Schizophreniform disorder Diagnosis:   Patient Active Problem List   Diagnosis Date Noted  . 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 Tobacco use disorder [Z72.0] 08/23/2015  . Cannabis use disorder, severe, dependence [F12.20] 08/23/2015   Total Time spent with patient: 30  minutes   Past Medical History:  Past Medical History  Diagnosis Date  . Anxiety   . Bipolar disorder     Past Surgical History  Procedure Laterality Date  . Appendectomy    . Tubial    . Tubal ligation     Family History: History reviewed. No pertinent family history. Social History:  History  Alcohol Use  . Yes     History  Drug Use  . 3.00 per week  . Special: Marijuana    Social History   Social History  . Marital Status: Single    Spouse Name: N/A  . Number of Children: N/A  . Years of Education: N/A   Social History Main Topics  . Smoking status: Current Every Day Smoker -- 1.00 packs/day    Types: Cigarettes  . Smokeless tobacco: Never Used  . Alcohol Use: Yes  . Drug Use: 3.00 per week    Special: Marijuana  . Sexual Activity: Not Currently   Other Topics Concern  . None   Social History Narrative   Additional History:    Sleep: Good  Appetite:  Poor    Assessment:   Musculoskeletal: Strength & Muscle Tone: within normal limits Gait & Station: normal Patient leans: N/A   Psychiatric Specialty Exam: Physical Exam  Constitutional: She is oriented to person, place, and time. She appears well-developed and well-nourished. No distress.  HENT:  Head: Normocephalic and atraumatic.  Respiratory: Breath sounds normal.  Neurological: She is alert and oriented to person, place, and time.  Skin: She is not diaphoretic.    Review of Systems  Constitutional: Negative.   HENT: Negative.  Eyes: Negative.   Respiratory: Negative.   Cardiovascular: Negative.   Gastrointestinal: Positive for nausea and vomiting.  Genitourinary: Negative.   Musculoskeletal: Negative.   Skin: Negative.   Neurological: Negative.   Endo/Heme/Allergies: Negative.   Psychiatric/Behavioral: Negative.     Blood pressure 130/73, pulse 130, temperature 98.5 F (36.9 C), temperature source Oral, resp. rate 20, height 5' (1.524 m), weight 44.453 kg (98 lb), SpO2 99  %.Body mass index is 19.14 kg/(m^2).  General Appearance: Fairly Groomed  Patent attorney::  Good  Speech:  Normal Rate  Volume:  Normal  Mood:  Irritable  Affect:  Congruent  Thought Process:  Disorganized  Orientation:  Full (Time, Place, and Person)  Thought Content:  Delusions and Paranoid Ideation  Suicidal Thoughts:  No  Homicidal Thoughts:  No  Memory:  Immediate;   Fair Recent;   Fair Remote;   Fair  Judgement:  Impaired  Insight:  Lacking  Psychomotor Activity:  Normal  Concentration:  NA  Recall:  NA  Fund of Knowledge:Fair  Language: Good  Akathisia:  No  Handed:    AIMS (if indicated):     Assets:  Financial Resources/Insurance Housing Social Support  ADL's:  Intact  Cognition: WNL  Sleep:  Number of Hours: 5.5     Current Medications: Current Facility-Administered Medications  Medication Dose Route Frequency Provider Last Rate Last Dose  . acetaminophen (TYLENOL) tablet 650 mg  650 mg Oral Q6H PRN Beau Fanny, MD      . alum & mag hydroxide-simeth (MAALOX/MYLANTA) 200-200-20 MG/5ML suspension 30 mL  30 mL Oral Q4H PRN Beau Fanny, MD      . feeding supplement (ENSURE ENLIVE) (ENSURE ENLIVE) liquid 237 mL  237 mL Oral TID PC & HS Jimmy Footman, MD   237 mL at 09/03/15 0834  . ibuprofen (ADVIL,MOTRIN) tablet 600 mg  600 mg Oral Q6H PRN Jimmy Footman, MD   600 mg at 09/02/15 1718  . LORazepam (ATIVAN) tablet 0.5 mg  0.5 mg Oral BID Jimmy Footman, MD   0.5 mg at 09/03/15 1610  . LORazepam (ATIVAN) tablet 2 mg  2 mg Oral Q6H PRN Jimmy Footman, MD   2 mg at 08/25/15 1449  . magnesium hydroxide (MILK OF MAGNESIA) suspension 30 mL  30 mL Oral Daily PRN Beau Fanny, MD      . metoprolol tartrate (LOPRESSOR) tablet 12.5 mg  12.5 mg Oral BID Jimmy Footman, MD   12.5 mg at 09/03/15 9604  . multivitamin with minerals tablet 1 tablet  1 tablet Oral Daily Jimmy Footman, MD   1 tablet at 09/03/15  [phone]  . ondansetron (ZOFRAN-ODT) disintegrating tablet 8 mg  8 mg Oral Q8H PRN Jimmy Footman, MD   8 mg at 09/02/15 0919  . risperiDONE (RISPERDAL M-TABS) disintegrating tablet 2 mg  2 mg Oral BID Jimmy Footman, MD   2 mg at 09/03/15 8119    Lab Results:  No results found for this or any previous visit (from the past 48 hour(s)).  Physical Findings: AIMS: Facial and Oral Movements Muscles of Facial Expression: None, normal Lips and Perioral Area: None, normal Jaw: None, normal Tongue: None, normal,Extremity Movements Upper (arms, wrists, hands, fingers): None, normal Lower (legs, knees, ankles, toes): None, normal, Trunk Movements Neck, shoulders, hips: None, normal, Overall Severity Severity of abnormal movements (highest score from questions above): None, normal Incapacitation due to abnormal movements: None, normal Patient's awareness of abnormal movements (rate only patient's report): Aware, no distress, Dental  Status Current problems with teeth and/or dentures?: No Does patient usually wear dentures?: No  CIWA:  CIWA-Ar Total: 6 COWS:     Treatment Plan Summary: Daily contact with patient to assess and evaluate symptoms and progress in treatment and Medication management   29 y/o wf with new onset psychosis. According to the emergency department on the involuntary commitment by her family as she has been very disorganized for the last 5 weeks. Over this past week and the patient was becoming agitated and aggressive.  Unspecified psychotic disorder/schizophreniform disorder: Patient continues to have severe nausea and vomiting which appears to be secondary to the treatment with olanzapine. Olanzapine was discontinued on September 22. She was started on Risperdal Mtab 2 mg by mouth twice a day. Patient continues to be delusional and since admission there is only minimal improvement.  If vomiting continued over the last 24 hours will order an abdominal  ultrasound.  Agitation: I will order Ativan 2 mg by mouth every 6 hours as needed  Anxiety: continue ativan 0.5 mg po bid  Tobacco use disorder: Patient states she does not need the gum and claims to be allergic to the nicotine patch  Vomiting/nausea: Ongoing problem since the beginning of her hospitalization possibly secondary to treatment with olanzapine. Today I will follow-up on CBC, compressive metabolic panel, I will check amylasa and GGT. Continue Zofran when necessary  Poor oral intake: Minimal oral intake. Patient has been started on multivitamins. Gatorades tid and ensures qid.  UTI: completed rocephin.  Denies symptoms.  Labs: TSH and ammonia wnl, B12 wnl, RPR -, HIV -.  Labs for today CBC, comprehensive metabolic panel, GGT, amylase, UA and urine culture  Imaging: completed head CT which was wnl.  Abd-pelvis CT was wnl--possible infection.  If vomiting continues consider abdominal ultrasound  Precautions and every 15 minute checks   Discharge planning: Once stable the patient will be discharged back to her husband house. She will be scheduled to follow-up with [facility].  Last contact with patient and husband was on September 22. He continues to report that every time he comes and visit patient continues to be very delusional and becomes very agitated therefore he has to leave after only a few minutes.    Today I spoke with [facility] staff and requested for them to contact me with information from her visits there.  [facility] reports that the patient only went there twice back in July. They diagnosed her with depression. They do not describe any kind of psychotic symptomatology, the only note was that her mood at times was incongruent. She then returned a second time to see the therapist but then after that and never returned.  Medical Decision Making:  New problem, with additional work up planned     Jimmy Footman 09/03/2015, 9:42 AM

## 2015-09-04 DIAGNOSIS — F2081 Schizophreniform disorder: Principal | ICD-10-CM

## 2015-09-04 NOTE — Progress Notes (Signed)
Patient ID: Tricia Kerr, female   DOB: [birth year], 29 y.o.   MRN: [id number]   CSW faxed IVC, CBC and additional progress notes yesterday. Today, CSW spoke with Britta Mccreedy at [facility] admissions. Referral is currently being reviewed by medical.   Rondall Allegra MSW, LCSWA  09/04/2015 3:00 PM

## 2015-09-04 NOTE — Plan of Care (Signed)
Problem: Alteration in mood Goal: STG-Patient is able to discuss feelings and issues (Patient is able to discuss feelings and issues leading to depression)  Outcome: Progressing Patient is discussing feelings with staff.

## 2015-09-04 NOTE — [facility] Group Notes (Signed)
[facility] Group Notes:  (Nursing/MHT/Case Management/Adjunct)  Date:  09/04/2015  Time:  3:46 AM  Type of Therapy:  Group Therapy  Participation Level:  Did Not Attend    Summary of Progress/Problems:  Veva Holes 09/04/2015, 3:46 AM

## 2015-09-04 NOTE — Progress Notes (Signed)
[facility] MD Progress Note  09/04/2015 2:28 PM Tricia Kerr  MRN:  [id number]  Subjective:  Tricia Kerr denies any symptoms of depression, anxiety, or psychosis. Tricia Kerr is very little still but was taking ensure. Tricia Kerr is rather tearful talking about her children especially the youngest one that is only 2 and a half years old. Tricia Kerr wants to be discharged. Tricia Kerr understands that Tricia Kerr has to take medication, attend groups, eat, sleep and participate in discharge planning. Tricia Kerr is not overtly psychotic. There are no behavioral problems. There are no somatic symptoms  Principal Problem: Schizophreniform disorder Diagnosis:   Patient Active Problem List   Diagnosis Date Noted  . 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 Tobacco use disorder [Z72.0] 08/23/2015  . Cannabis use disorder, severe, dependence [F12.20] 08/23/2015   Total Time spent with patient: 20 minutes   Past Medical History:  Past Medical History  Diagnosis Date  . Anxiety   . Bipolar disorder     Past Surgical History  Procedure Laterality Date  . Appendectomy    . Tubial    . Tubal ligation     Family History: History reviewed. No pertinent family history. Social History:  History  Alcohol Use  . Yes     History  Drug Use  . 3.00 per week  . Special: Marijuana    Social History   Social History  . Marital Status: Single    Spouse Name: N/A  . Number of Children: N/A  . Years of Education: N/A   Social History Main Topics  . Smoking status: Current Every Day Smoker -- 1.00 packs/day    Types: Cigarettes  . Smokeless tobacco: Never Used  . Alcohol Use: Yes  . Drug Use: 3.00 per week    Special: Marijuana  . Sexual Activity: Not Currently   Other Topics Concern  . None   Social History Narrative   Additional History:    Sleep: Fair  Appetite:  Fair   Assessment:   Musculoskeletal: Strength & Muscle Tone: within normal limits Gait & Station: normal Patient leans: N/A   Psychiatric  Specialty Exam: Physical Exam  Nursing note and vitals reviewed.   Review of Systems  All other systems reviewed and are negative.   Blood pressure 91/66, pulse 122, temperature 98.5 F (36.9 C), temperature source Oral, resp. rate 18, height 5' (1.524 m), weight 44.453 kg (98 lb), SpO2 99 %.Body mass index is 19.14 kg/(m^2).  General Appearance: Casual  Eye Contact::  Good  Speech:  Clear and Coherent  Volume:  Normal  Mood:  Anxious  Affect:  Tearful  Thought Process:  Goal Directed  Orientation:  Full (Time, Place, and Person)  Thought Content:  WDL  Suicidal Thoughts:  No  Homicidal Thoughts:  No  Memory:  Immediate;   Fair Recent;   Fair Remote;   Fair  Judgement:  Impaired  Insight:  Shallow  Psychomotor Activity:  Normal  Concentration:  Fair  Recall:  [place]  Language: Fair  Akathisia:  No  Handed:  Right  AIMS (if indicated):     Assets:  Communication Skills Desire for Improvement Financial Resources/Insurance Housing Physical Health Resilience Social Support  ADL's:  Intact  Cognition: WNL  Sleep:  Number of Hours: 7.5     Current Medications: Current Facility-Administered Medications  Medication Dose Route Frequency Provider Last Rate Last Dose  . acetaminophen (TYLENOL) tablet 650 mg  650 mg Oral Q6H PRN Dewain Penning, MD      .  alum & mag hydroxide-simeth (MAALOX/MYLANTA) 200-200-20 MG/5ML suspension 30 mL  30 mL Oral Q4H PRN Dewain Penning, MD      . feeding supplement (ENSURE ENLIVE) (ENSURE ENLIVE) liquid 237 mL  237 mL Oral TID PC & HS Hildred Priest, MD   237 mL at 09/04/15 1243  . ibuprofen (ADVIL,MOTRIN) tablet 600 mg  600 mg Oral Q6H PRN Hildred Priest, MD   600 mg at 09/02/15 1718  . LORazepam (ATIVAN) tablet 0.5 mg  0.5 mg Oral BID Hildred Priest, MD   0.5 mg at 09/04/15 0907  . LORazepam (ATIVAN) tablet 2 mg  2 mg Oral Q6H PRN Hildred Priest, MD   2 mg at 09/03/15 1235  .  magnesium hydroxide (MILK OF MAGNESIA) suspension 30 mL  30 mL Oral Daily PRN Dewain Penning, MD      . metoprolol tartrate (LOPRESSOR) tablet 12.5 mg  12.5 mg Oral BID Hildred Priest, MD   12.5 mg at 09/04/15 0907  . multivitamin with minerals tablet 1 tablet  1 tablet Oral Daily Hildred Priest, MD   1 tablet at 09/04/15 0906  . ondansetron (ZOFRAN-ODT) disintegrating tablet 8 mg  8 mg Oral Q8H PRN Hildred Priest, MD   8 mg at 09/02/15 0919  . potassium chloride SA (K-DUR,KLOR-CON) CR tablet 20 mEq  20 mEq Oral BID Hildred Priest, MD   20 mEq at 09/04/15 0907  . risperiDONE (RISPERDAL M-TABS) disintegrating tablet 2 mg  2 mg Oral BID Hildred Priest, MD   2 mg at 09/04/15 0907    Lab Results:  Results for orders placed or performed during the hospital encounter of 08/23/15 (from the past 48 hour(s))  CBC     Status: Abnormal   Collection Time: 09/03/15 10:20 AM  Result Value Ref Range   WBC 13.6 (H) 3.6 - 11.0 K/uL   RBC 4.88 3.80 - 5.20 MIL/uL   Hemoglobin 14.9 12.0 - 16.0 g/dL   HCT 42.9 35.0 - 47.0 %   MCV 87.9 80.0 - 100.0 fL   MCH 30.6 26.0 - 34.0 pg   MCHC 34.8 32.0 - 36.0 g/dL   RDW 12.9 11.5 - 14.5 %   Platelets 251 150 - 440 K/uL  Comprehensive metabolic panel     Status: Abnormal   Collection Time: 09/03/15 10:20 AM  Result Value Ref Range   Sodium 136 135 - 145 mmol/L   Potassium 3.4 (L) 3.5 - 5.1 mmol/L   Chloride 92 (L) 101 - 111 mmol/L   CO2 34 (H) 22 - 32 mmol/L   Glucose, Bld 144 (H) 65 - 99 mg/dL   BUN 20 6 - 20 mg/dL   Creatinine, Ser 0.81 0.44 - 1.00 mg/dL   Calcium 9.5 8.9 - 10.3 mg/dL   Total Protein 7.1 6.5 - 8.1 g/dL   Albumin 4.1 3.5 - 5.0 g/dL   AST 32 15 - 41 U/L   ALT 24 14 - 54 U/L   Alkaline Phosphatase 67 38 - 126 U/L   Total Bilirubin 1.0 0.3 - 1.2 mg/dL   GFR calc non Af Amer >60 >60 mL/min   GFR calc Af Amer >60 >60 mL/min    Comment: (NOTE) The eGFR has been calculated using the CKD EPI  equation. This calculation has not been validated in all clinical situations. eGFR's persistently <60 mL/min signify possible Chronic Kidney Disease.    Anion gap 10 5 - 15  Amylase     Status: None   Collection Time: 09/03/15  10:20 AM  Result Value Ref Range   Amylase 69 28 - 100 U/L  Gamma GT     Status: None   Collection Time: 09/03/15 10:20 AM  Result Value Ref Range   GGT 35 7 - 50 U/L  Urinalysis complete, with microscopic ([facility] only)     Status: Abnormal   Collection Time: 09/03/15 11:35 AM  Result Value Ref Range   Color, Urine YELLOW (A) YELLOW   APPearance HAZY (A) CLEAR   Glucose, UA 150 (A) NEGATIVE mg/dL   Bilirubin Urine NEGATIVE NEGATIVE   Ketones, ur NEGATIVE NEGATIVE mg/dL   Specific Gravity, Urine 1.009 1.005 - 1.030   Hgb urine dipstick NEGATIVE NEGATIVE   pH 7.0 5.0 - 8.0   Protein, ur NEGATIVE NEGATIVE mg/dL   Nitrite NEGATIVE NEGATIVE   Leukocytes, UA NEGATIVE NEGATIVE   RBC / HPF 0-5 0 - 5 RBC/hpf   WBC, UA 0-5 0 - 5 WBC/hpf   Bacteria, UA NONE SEEN NONE SEEN   Squamous Epithelial / LPF 6-30 (A) NONE SEEN  Urine culture     Status: None (Preliminary result)   Collection Time: 09/03/15 11:35 AM  Result Value Ref Range   Specimen Description URINE, CLEAN CATCH    Special Requests Normal    Culture NO GROWTH < 24 HOURS    Report Status PENDING     Physical Findings: AIMS: Facial and Oral Movements Muscles of Facial Expression: None, normal Lips and Perioral Area: None, normal Jaw: None, normal Tongue: None, normal,Extremity Movements Upper (arms, wrists, hands, fingers): None, normal Lower (legs, knees, ankles, toes): None, normal, Trunk Movements Neck, shoulders, hips: None, normal, Overall Severity Severity of abnormal movements (highest score from questions above): None, normal Incapacitation due to abnormal movements: None, normal Patient's awareness of abnormal movements (rate only patient's report): Aware, no distress, Dental  Status Current problems with teeth and/or dentures?: No Does patient usually wear dentures?: No  CIWA:  CIWA-Ar Total: 6 COWS:     Treatment Plan Summary: Daily contact with patient to assess and evaluate symptoms and progress in treatment and Medication management   Medical Decision Making:  Established Problem, Stable/Improving (1), Review of Psycho-Social Stressors (1), Review or order clinical lab tests (1), Review of Medication Regimen & Side Effects (2) and Review of New Medication or Change in Dosage (2)   29 y/o wf with new onset psychosis. According to the emergency department on the involuntary commitment by her family as Tricia Kerr has been very disorganized for the last 5 weeks. Over this past week and the patient was becoming agitated and aggressive.  Unspecified psychotic disorder/schizophreniform disorder: Patient continues to have severe nausea and vomiting which appears to be secondary to the treatment with olanzapine. Olanzapine was discontinued on September 22. Tricia Kerr was started on Risperdal Mtab 2 mg by mouth twice a day. Patient continues to be delusional and since admission there is only minimal improvement. If vomiting continued over the last 24 hours will order an abdominal ultrasound.  Agitation: I will order Ativan 2 mg by mouth every 6 hours as needed  Anxiety: continue ativan 0.5 mg po bid  Tobacco use disorder: Patient states Tricia Kerr does not need the gum and claims to be allergic to the nicotine patch  Vomiting/nausea: Ongoing problem since the beginning of her hospitalization possibly secondary to treatment with olanzapine. Today I will follow-up on CBC, compressive metabolic panel, I will check amylasa and GGT. Continue Zofran when necessary  Poor oral intake: Minimal oral intake. Patient  has been started on multivitamins. Gatorades tid and ensures qid.  UTI: completed rocephin. Denies symptoms.  Labs: TSH and ammonia wnl, B12 wnl, RPR -, HIV -. Labs for today CBC,  comprehensive metabolic panel, GGT, amylase, UA and urine culture  Imaging: completed head CT which was wnl. Abd-pelvis CT was wnl--possible infection. If vomiting continues consider abdominal ultrasound  Precautions and every 15 minute checks   Discharge planning: Once stable the patient will be discharged back to her husband house. Tricia Kerr will be scheduled to follow-up with [facility].  Last contact with patient and husband was on September 22. He continues to report that every time he comes and visit patient continues to be very delusional and becomes very agitated therefore he has to leave after only a few minutes.   Today I spoke with [facility] staff and requested for them to contact me with information from her visits there. [facility] reports that the patient only went there twice back in July. They diagnosed her with depression. They do not describe any kind of psychotic symptomatology, the only note was that her mood at times was incongruent. Tricia Kerr then returned a second time to see the therapist but then after that and never returned.  9/24 no medication changes were offered. Supportive therapy was provided     Jolanta Pucilowska 09/04/2015, 2:28 PM

## 2015-09-04 NOTE — [facility] Group Notes (Signed)
[facility] Group Notes:  (Nursing/MHT/Case Management/Adjunct)  Date:  09/04/2015  Time:  9:50 PM  Type of Therapy:  Evening Wrap-up Group  Participation Level:  Minimal  Participation Quality:  Attentive  Affect:  Appropriate  Cognitive:  Appropriate  Insight:  Improving  Engagement in Group:  Improving  Modes of Intervention:  Discussion  Summary of Progress/Problems:  Tricia Kerr 09/04/2015, 9:50 PM

## 2015-09-04 NOTE — [facility] Group Notes (Signed)
[facility] LCSW Group Therapy  09/04/2015 3:56 PM  Type of Therapy:  Group Therapy  Participation Level:  Minimal  Participation Quality:  Attentive  Affect:  Flat   Cognitive:  Delusional   Insight:  Limited  Engagement in Therapy:  Limited  Modes of Intervention:  Discussion, Education, Socialization and Support  Summary of Progress/Problems: Balance in life: Patients will discuss the concept of balance and how it looks and feels to be unbalanced. Pt will identify areas in their life that is unbalanced and ways to become more balanced. Pt attended group and stayed the entire time. She sat quietly and listened to other group members.   Daisy Floro Hyatt MSW, LCSWA  09/04/2015, 3:56 PM

## 2015-09-04 NOTE — Progress Notes (Signed)
D: Pt denies SI/HI/AVH. Pt is pleasant and cooperative she appears less anxious and he is interacting with peers and staff appropriately.  A: Pt was offered support and encouragement. Pt was given scheduled medications. Pt was encouraged to attend groups. Q 15 minute checks were done for safety.  R:Pt did not  attend group session this evening.  Pt is taking medication. Pt has no complaints.Pt receptive to treatment and safety maintained on unit.

## 2015-09-04 NOTE — Progress Notes (Signed)
Pt has been pleasant and cooperative. Pt did attend some unit activities. Pt has been more active on the unit. Pt's mood and affect continues to be depressed. Pt continues to be compliant with taking his po meds.

## 2015-09-05 LAB — URINE CULTURE: Special Requests: NORMAL

## 2015-09-05 NOTE — [facility] Group Notes (Signed)
[facility] LCSW Group Therapy  09/05/2015 3:09 PM  Type of Therapy:  Group Therapy  Participation Level:  Did Not Attend  Modes of Intervention:  Discussion, Education, Socialization and Support  Summary of Progress/Problems: Communications: Patients identify how individuals communicate with one another appropriately and inappropriately. Patients will be guided to discuss their thoughts, feelings, and behaviors related to barriers when communicating. The group will process together ways to execute positive and appropriate communications   Darlisa Spruiell L Tommaso Cavitt MSW, LCSWA  09/05/2015, 3:09 PM 

## 2015-09-05 NOTE — Plan of Care (Signed)
Problem: Ineffective individual coping Goal: LTG: Patient will report a decrease in negative feelings Outcome: Progressing Michelle pleasant on approach, patient voiced no negative feelings on shift.

## 2015-09-05 NOTE — Plan of Care (Signed)
Problem: Alteration in mood Goal: LTG-Pt's behavior demonstrates decreased signs of depression (Patient's behavior demonstrates decreased signs of depression to the point the patient is safe to return home and continue treatment in an outpatient setting)  Outcome: Progressing Patient alert and interactive with her peers and clinical staff. Appropriate behavior in the milieu.  Goal: STG-Patient is able to discuss feelings and issues (Patient is able to discuss feelings and issues leading to depression)  Outcome: Progressing Patient reports feeling better.   Problem: Alteration in mood; excessive anxiety as evidenced by: Goal: LTG-Patient's behavior demonstrates decreased anxiety (Patient's behavior demonstrates anxiety and he/she is utilizing learned coping skills to deal with anxiety-producing situations)  Outcome: Progressing Patient able to be interactive and pleasant in the milieu, and more able to have a reality based conversation.

## 2015-09-05 NOTE — Progress Notes (Signed)
Pt has been pleasant and cooperative. Pt did attend some unit activities. Pt has been more active on the unit. Pt's mood and affect continues to be depressed. Pt continues to be compliant with taking his po meds. Pt c/o having some increased anxiety and was given ativan s po which appeared to have been effective.

## 2015-09-06 MED ORDER — RISPERIDONE 0.5 MG PO TBDP
3.0000 mg | ORAL_TABLET | Freq: Two times a day (BID) | ORAL | Status: DC
  Administered 2015-09-06 – 2015-09-09 (×6): 3 mg via ORAL
  Filled 2015-09-06: qty 3
  Filled 2015-09-06 (×3): qty 6
  Filled 2015-09-06: qty 3
  Filled 2015-09-06 (×2): qty 6

## 2015-09-06 NOTE — [facility] Group Notes (Signed)
[facility] Group Notes:  (Nursing/MHT/Case Management/Adjunct)  Date:  09/06/2015  Time:  10:31 PM  Type of Therapy:  Group Therapy  Participation Level:  Active  Participation Quality:  Appropriate  Affect:  Appropriate  Cognitive:  Appropriate  Insight:  Appropriate  Engagement in Group:  Engaged  Modes of Intervention:  Discussion  Summary of Progress/Problems:  Tricia Kerr 09/06/2015, 10:31 PM

## 2015-09-06 NOTE — Progress Notes (Signed)
Assumed care of patient at 03:00. Patient sleeping and in no apparent distress.  Continue to monitor q 15 minutes for safety.  

## 2015-09-06 NOTE — Progress Notes (Signed)
Recreation Therapy Notes  Date: 09.26.16 Time: 3:00 pm Location: Craft Room  Group Topic: Self-expression  Goal Area(s) Addresses:  Patient will identify one color per emotion listed on wheel. Patient will verbalize benefit of using art as a means of self-expression. Patient will verbalize one emotion experienced during session. Patient will be educated on other forms of self-expression.  Behavioral Response: Attentive  Intervention: Emotion Wheel  Activity: Patients were given a worksheet with 7 different emotions and instructed to pick a color for each emotion.  Education: LRT educated patients on other forms of self-expression.  Education Outcome: In group clarification offered  Clinical Observations/Feedback: Patient completed activity by picking a color for each emotion. Patient did not contribute to group discussion.  Jacquelynn Cree, LRT/CTRS 09/06/2015 5:04 PM

## 2015-09-06 NOTE — [facility] Group Notes (Addendum)
[facility] LCSW Group Therapy  09/06/2015 5:48 PM  Type of Therapy:  Group Therapy  Participation Level:  Minimal  Participation Quality:  Attentive  Affect:  Appropriate  Cognitive:  Appropriate  Insight:  Developing/Improving  Engagement in Therapy:  Developing/Improving  Modes of Intervention:  Discussion, Education and Exploration  Summary of Progress/Problems:LCSW discussed the impact of suicide on survivors. She was able to reflect and share her feelings around suicide and relate to her peers.Suicide prevention and intervention and education materials were shared  Cheron Schaumann 09/06/2015, 5:48 PM

## 2015-09-06 NOTE — [facility] Group Notes (Signed)
[facility] Group Notes:  (Nursing/MHT/Case Management/Adjunct)  Date:  09/06/2015  Time:  3:04 PM  Type of Therapy:  Psychoeducational Skills  Participation Level:  Active  Participation Quality:  Appropriate, Attentive and Sharing  Affect:  Appropriate  Cognitive:  Alert and Appropriate  Insight:  Appropriate  Engagement in Group:  Engaged  Modes of Intervention:  Discussion, Education and Support  Summary of Progress/Problems:  Tricia Kerr [facility] 09/06/2015, 3:04 PM

## 2015-09-06 NOTE — Progress Notes (Addendum)
[facility] MD Progress Note  09/06/2015 1:10 AM Tricia Kerr  MRN:  [id number]  Subjective: Ms. Rocha has no complaints and denies any symptoms of depression, anxiety or psychosis. She is secluded to her room and does not intrract with peers or staff. Claims that she eats a part of each meal and suplements it with Ensure. She knows that this is one of the conditions of discharge. She misses her young son. As I was leaving her room, I was under the impression that she was talking to herself.  Principal Problem: Schizophreniform disorder Diagnosis:   Patient Active Problem List   Diagnosis Date Noted  . 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 Tobacco use disorder [Z72.0] 08/23/2015  . Cannabis use disorder, severe, dependence [F12.20] 08/23/2015   Total Time spent with patient: 20 minutes   Past Medical History:  Past Medical History  Diagnosis Date  . Anxiety   . Bipolar disorder     Past Surgical History  Procedure Laterality Date  . Appendectomy    . Tubial    . Tubal ligation     Family History: History reviewed. No pertinent family history. Social History:  History  Alcohol Use  . Yes     History  Drug Use  . 3.00 per week  . Special: Marijuana    Social History   Social History  . Marital Status: Single    Spouse Name: N/A  . Number of Children: N/A  . Years of Education: N/A   Social History Main Topics  . Smoking status: Current Every Day Smoker -- 1.00 packs/day    Types: Cigarettes  . Smokeless tobacco: Never Used  . Alcohol Use: Yes  . Drug Use: 3.00 per week    Special: Marijuana  . Sexual Activity: Not Currently   Other Topics Concern  . None   Social History Narrative   Additional History:    Sleep: Good  Appetite:  Poor   Assessment:   Musculoskeletal: Strength & Muscle Tone: within normal limits Gait & Station: normal Patient leans: N/A   Psychiatric Specialty Exam: Physical Exam  Nursing note and vitals reviewed.    Review of Systems  All other systems reviewed and are negative.   Blood pressure 100/69, pulse 105, temperature 98.5 F (36.9 C), temperature source Oral, resp. rate 18, height 5' (1.524 m), weight 44.453 kg (98 lb), SpO2 99 %.Body mass index is 19.14 kg/(m^2).  General Appearance: Casual  Eye Contact::  Fair  Speech:  Clear and Coherent  Volume:  Normal  Mood:  Dysphoric  Affect:  Flat  Thought Process:  Goal Directed  Orientation:  Full (Time, Place, and Person)  Thought Content:  Delusions, Hallucinations: Auditory and Paranoid Ideation  Suicidal Thoughts:  No  Homicidal Thoughts:  No  Memory:  Immediate;   Fair Recent;   Fair Remote;   Fair  Judgement:  Poor  Insight:  Lacking  Psychomotor Activity:  Normal  Concentration:  Fair  Recall:  Poor  Fund of Knowledge:Fair  Language: Fair  Akathisia:  No  Handed:  Right  AIMS (if indicated):     Assets:  Communication Skills Desire for Improvement  ADL's:  Intact  Cognition: WNL  Sleep:  Number of Hours: 6.75     Current Medications: Current Facility-Administered Medications  Medication Dose Route Frequency Provider Last Rate Last Dose  . acetaminophen (TYLENOL) tablet 650 mg  650 mg Oral Q6H PRN Beau Fanny, MD      . alum & mag  hydroxide-simeth (MAALOX/MYLANTA) 200-200-20 MG/5ML suspension 30 mL  30 mL Oral Q4H PRN Beau Fanny, MD      . feeding supplement (ENSURE ENLIVE) (ENSURE ENLIVE) liquid 237 mL  237 mL Oral TID PC & HS Jimmy Footman, MD   237 mL at 09/05/15 2104  . ibuprofen (ADVIL,MOTRIN) tablet 600 mg  600 mg Oral Q6H PRN Jimmy Footman, MD   600 mg at 09/04/15 1527  . LORazepam (ATIVAN) tablet 0.5 mg  0.5 mg Oral BID Jimmy Footman, MD   0.5 mg at 09/05/15 2103  . LORazepam (ATIVAN) tablet 2 mg  2 mg Oral Q6H PRN Jimmy Footman, MD   2 mg at 09/05/15 2231  . magnesium hydroxide (MILK OF MAGNESIA) suspension 30 mL  30 mL Oral Daily PRN Beau Fanny, MD       . metoprolol tartrate (LOPRESSOR) tablet 12.5 mg  12.5 mg Oral BID Jimmy Footman, MD   12.5 mg at 09/05/15 2103  . multivitamin with minerals tablet 1 tablet  1 tablet Oral Daily Jimmy Footman, MD   1 tablet at 09/05/15 [phone]  . ondansetron (ZOFRAN-ODT) disintegrating tablet 8 mg  8 mg Oral Q8H PRN Jimmy Footman, MD   8 mg at 09/02/15 0919  . risperiDONE (RISPERDAL M-TABS) disintegrating tablet 2 mg  2 mg Oral BID Jimmy Footman, MD   2 mg at 09/05/15 2103    Lab Results: No results found for this or any previous visit (from the past 48 hour(s)).  Physical Findings: AIMS: Facial and Oral Movements Muscles of Facial Expression: None, normal Lips and Perioral Area: None, normal Jaw: None, normal Tongue: None, normal,Extremity Movements Upper (arms, wrists, hands, fingers): None, normal Lower (legs, knees, ankles, toes): None, normal, Trunk Movements Neck, shoulders, hips: None, normal, Overall Severity Severity of abnormal movements (highest score from questions above): None, normal Incapacitation due to abnormal movements: None, normal Patient's awareness of abnormal movements (rate only patient's report): Aware, no distress, Dental Status Current problems with teeth and/or dentures?: No Does patient usually wear dentures?: No  CIWA:  CIWA-Ar Total: 6 COWS:     Treatment Plan Summary: Daily contact with patient to assess and evaluate symptoms and progress in treatment and Medication management   Medical Decision Making:  Established Problem, Stable/Improving (1), Review of Psycho-Social Stressors (1), Review or order clinical lab tests (1), Review of Medication Regimen & Side Effects (2) and Review of New Medication or Change in Dosage (2)   29 y/o wf with new onset psychosis. According to the emergency department on the involuntary commitment by her family as she has been very disorganized for the last 5 weeks. Over this past week and  the patient was becoming agitated and aggressive.  Unspecified psychotic disorder/schizophreniform disorder: Patient continues to have severe nausea and vomiting which appears to be secondary to the treatment with olanzapine. Olanzapine was discontinued on September 22. She was started on Risperdal Mtab 2 mg by mouth twice a day. Patient continues to be delusional and since admission there is only minimal improvement. If vomiting continued over the last 24 hours will order an abdominal ultrasound.  Agitation: I will order Ativan 2 mg by mouth every 6 hours as needed  Anxiety: continue ativan 0.5 mg po bid  Tobacco use disorder: Patient states she does not need the gum and claims to be allergic to the nicotine patch  Vomiting/nausea: Ongoing problem since the beginning of her hospitalization possibly secondary to treatment with olanzapine. Today I will follow-up on CBC,  compressive metabolic panel, I will check amylasa and GGT. Continue Zofran when necessary  Poor oral intake: Minimal oral intake. Patient has been started on multivitamins. Gatorades tid and ensures qid.  UTI: completed rocephin. Denies symptoms.  Labs: TSH and ammonia wnl, B12 wnl, RPR -, HIV -. Labs for today CBC, comprehensive metabolic panel, GGT, amylase, UA and urine culture  Imaging: completed head CT which was wnl. Abd-pelvis CT was wnl--possible infection. If vomiting continues consider abdominal ultrasound  Precautions and every 15 minute checks   Discharge planning: Once stable the patient will be discharged back to her husband house. She will be scheduled to follow-up with [facility].  Last contact with patient and husband was on September 22. He continues to report that every time he comes and visit patient continues to be very delusional and becomes very agitated therefore he has to leave after only a few minutes.   Today I spoke with [facility] staff and requested for them to contact me with information from her  visits there. [facility] reports that the patient only went there twice back in July. They diagnosed her with depression. They do not describe any kind of psychotic symptomatology, the only note was that her mood at times was incongruent. She then returned a second time to see the therapist but then after that and never returned.  9/24and 9/25 no medication changes were offered. Supportive therapy was provided.      Maddix Kliewer 09/06/2015, 1:10 AM

## 2015-09-06 NOTE — Progress Notes (Signed)
Pt has been pleasant and cooperative. Pt did attend some unit activities. Pt has been more active on the unit. Pt's mood and affect continues to be depressed but she brightens up and even laughs appropriately when engaged.  . Pt continues to be compliant with taking  po meds. [facility] needs for admission were passed on to Dr. Ardyth Harps including labs and CT scans as per [facility] SW request. Will cont to monitor for safety.

## 2015-09-06 NOTE — Progress Notes (Signed)
D: Patient alert and oriented x4. Patient denies SI/HI/AVH. Patient found tearful in room stating "I don't know while I'm still here..I just want to leave." Patient still endorses existence of diamonds, copper and gold in abdomen and 8 broken ribs. Patient also still believes that her husband previously annulled their marriage.Patient states "Dr. Dareen Piano is the only one I trust" and believes she is receiving no benefit from her current hospitalization.   A: Offered Ibuprofen and tylenol pain meds prn. Actively listened to patients concerns. Tried to explain current rational behind her continued hospitalization. Suggested that patient be open to trusting staff on unit.  R: Patient did not want Ibuprofen nor tylenol stating "that won't do anything." Patient continued to endorse distrust in staff and disagreement in current hospitalization. Will continue to q15 min. Checks.

## 2015-09-06 NOTE — Progress Notes (Signed)
Pt has been pleasant and cooperative wit htreatment. Pt was visible in the milieu and interacted well with peers and staff.  Pt's mood was brighter but she continues to report being depressed. Pt continues to be compliant with taking her medicines. Patient complained of not being able to sleep , she was given Ativan    which appeared to have been effective. Patient is currently in bed resting at this time. Will continue to monitor behavior and ensure safety.

## 2015-09-06 NOTE — Progress Notes (Addendum)
[facility] MD Progress Note  09/06/2015 12:34 PM Tricia Kerr  MRN:  [id number]  Subjective: Patient was seen having a snack in the day room. She reported feeling much better, and denies any episodes of nausea and vomiting. She states she's been eating well. The patient was much calmer and was not argumentative with this Clinical research associate. She continues to refer to her husband as her ex-husband, but there was no evidence of bizarre delusions.  Per review of the notes over the weekend the patient was described as withdrawn to her room, depressed with flat mood. Staff did not mention any bizarre delusional thinking. Looks like patient has been attending some groups as well. She is being compliant with medications.  I plan to discuss case with family today.  Per nursing: Pt has been pleasant and cooperative. Pt did attend some unit activities. Pt has been more active on the unit. Pt's mood and affect continues to be depressed. Pt continues to be compliant with taking his po meds. Pt c/o having some increased anxiety and was given ativan s po which appeared to have been effective.              Principal Problem: Schizophreniform disorder Diagnosis:   Patient Active Problem List   Diagnosis Date Noted  . 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 Tobacco use disorder [Z72.0] 08/23/2015  . Cannabis use disorder, severe, dependence [F12.20] 08/23/2015   Total Time spent with patient: 30 minutes   Past Medical History:  Past Medical History  Diagnosis Date  . Anxiety   . Bipolar disorder     Past Surgical History  Procedure Laterality Date  . Appendectomy    . Tubial    . Tubal ligation     Family History: History reviewed. No pertinent family history. Social History:  History  Alcohol Use  . Yes     History  Drug Use  . 3.00 per week  . Special: Marijuana    Social History   Social History  . Marital Status: Single    Spouse Name: N/A  . Number of Children: N/A  .  Years of Education: N/A   Social History Main Topics  . Smoking status: Current Every Day Smoker -- 1.00 packs/day    Types: Cigarettes  . Smokeless tobacco: Never Used  . Alcohol Use: Yes  . Drug Use: 3.00 per week    Special: Marijuana  . Sexual Activity: Not Currently   Other Topics Concern  . None   Social History Narrative   Additional History:    Sleep: Good  Appetite:  Good   Assessment:   Musculoskeletal: Strength & Muscle Tone: within normal limits Gait & Station: normal Patient leans: N/A   Psychiatric Specialty Exam: Physical Exam  Nursing note and vitals reviewed. Constitutional: She is oriented to person, place, and time. She appears well-developed and well-nourished.  HENT:  Head: Normocephalic and atraumatic.  Eyes: Conjunctivae and EOM are normal.  Neck: Normal range of motion.  Respiratory: Effort normal.  Musculoskeletal: Normal range of motion.  Neurological: She is alert and oriented to person, place, and time.    Review of Systems  Constitutional: Negative.   HENT: Negative.   Eyes: Negative.   Respiratory: Negative.   Cardiovascular: Negative.   Gastrointestinal: Negative.   Skin: Negative.   Neurological: Negative.   Endo/Heme/Allergies: Negative.   Psychiatric/Behavioral: Negative.   All other systems reviewed and are negative.   Blood pressure 103/68, pulse 92, temperature 98.2 F (36.8 C), temperature source  Oral, resp. rate 20, height 5' (1.524 m), weight 44.453 kg (98 lb), SpO2 99 %.Body mass index is 19.14 kg/(m^2).  General Appearance: Casual  Eye Contact::  Fair  Speech:  Clear and Coherent  Volume:  Normal  Mood:  Dysphoric  Affect:  Flat  Thought Process:  Goal Directed  Orientation:  Full (Time, Place, and Person)  Thought Content:  Delusions, Hallucinations: Auditory and Paranoid Ideation  Suicidal Thoughts:  No  Homicidal Thoughts:  No  Memory:  Immediate;   Fair Recent;   Fair Remote;   Fair  Judgement:  Poor   Insight:  Lacking  Psychomotor Activity:  Normal  Concentration:  Fair  Recall:  Poor  Fund of Knowledge:Fair  Language: Fair  Akathisia:  No  Handed:  Right  AIMS (if indicated):     Assets:  Communication Skills Desire for Improvement  ADL's:  Intact  Cognition: WNL  Sleep:  Number of Hours: 6.75     Current Medications: Current Facility-Administered Medications  Medication Dose Route Frequency Provider Last Rate Last Dose  . acetaminophen (TYLENOL) tablet 650 mg  650 mg Oral Q6H PRN Beau Fanny, MD      . alum & mag hydroxide-simeth (MAALOX/MYLANTA) 200-200-20 MG/5ML suspension 30 mL  30 mL Oral Q4H PRN Beau Fanny, MD      . feeding supplement (ENSURE ENLIVE) (ENSURE ENLIVE) liquid 237 mL  237 mL Oral TID PC & HS Jimmy Footman, MD   237 mL at 09/06/15 1231  . ibuprofen (ADVIL,MOTRIN) tablet 600 mg  600 mg Oral Q6H PRN Jimmy Footman, MD   600 mg at 09/04/15 1527  . LORazepam (ATIVAN) tablet 0.5 mg  0.5 mg Oral BID Jimmy Footman, MD   0.5 mg at 09/06/15 0907  . LORazepam (ATIVAN) tablet 2 mg  2 mg Oral Q6H PRN Jimmy Footman, MD   2 mg at 09/05/15 2231  . magnesium hydroxide (MILK OF MAGNESIA) suspension 30 mL  30 mL Oral Daily PRN Beau Fanny, MD      . metoprolol tartrate (LOPRESSOR) tablet 12.5 mg  12.5 mg Oral BID Jimmy Footman, MD   12.5 mg at 09/06/15 0906  . multivitamin with minerals tablet 1 tablet  1 tablet Oral Daily Jimmy Footman, MD   1 tablet at 09/06/15 0906  . ondansetron (ZOFRAN-ODT) disintegrating tablet 8 mg  8 mg Oral Q8H PRN Jimmy Footman, MD   8 mg at 09/02/15 0919  . risperiDONE (RISPERDAL M-TABS) disintegrating tablet 3 mg  3 mg Oral BID Jimmy Footman, MD        Lab Results: No results found for this or any previous visit (from the past 48 hour(s)).  Physical Findings: AIMS: Facial and Oral Movements Muscles of Facial Expression: None, normal Lips  and Perioral Area: None, normal Jaw: None, normal Tongue: None, normal,Extremity Movements Upper (arms, wrists, hands, fingers): None, normal Lower (legs, knees, ankles, toes): None, normal, Trunk Movements Neck, shoulders, hips: None, normal, Overall Severity Severity of abnormal movements (highest score from questions above): None, normal Incapacitation due to abnormal movements: None, normal Patient's awareness of abnormal movements (rate only patient's report): Aware, no distress, Dental Status Current problems with teeth and/or dentures?: No Does patient usually wear dentures?: No  CIWA:  CIWA-Ar Total: 6 COWS:     Treatment Plan Summary: Daily contact with patient to assess and evaluate symptoms and progress in treatment and Medication management   Medical Decision Making:  Established Problem, Stable/Improving (1), Review of Psycho-Social Stressors (  1), Review or order clinical lab tests (1), Review of Medication Regimen & Side Effects (2) and Review of New Medication or Change in Dosage (2)   29 y/o wf with new onset psychosis. According to the emergency department on the involuntary commitment by her family as she has been very disorganized for the last 5 weeks. Over this past week and the patient was becoming agitated and aggressive.  Unspecified psychotic disorder/schizophreniform disorder: Patient seems to be improving since is started on Risperdal 2 mg by mouth twice a day. Nausea and vomiting are now resolved. Patient has been eating well since yesterday. Today I plan to increase dose of Risperdal to 3 mg by mouth twice a day. -Patient was initially tried on olanzapine which was titrated up to 30 mg a day. The patient had no response or improvement with olanzapine the spice taking this medication for about 2 weeks. In addition patient developed severe nausea and vomiting which is now resolved since olanzapine was discontinued.  Agitation: I will order Ativan 2 mg by mouth  every 6 hours as needed  Anxiety: continue ativan 0.5 mg po bid  Tachycardia: During the past week the patient had a heart rate between 100 -120. She was started on metoprolol 12.5 twice a day.  Heart rate is now much improved as patient is eating regularly. Slightly tachycardia secondary to the use of anti-psychotics.  Tobacco use disorder: Patient states she does not need the gum and claims to be allergic to the nicotine patch  Vomiting/nausea: Now resolved. Appears that the nausea and vomiting were secondary to olanzapine.  Poor oral intake: During the last 2 weeks her oral intake was poor. Over the last today's patient has been eating about 100% of her meals.  UTI: Diagnosed with UTI-cystitis. Patient was treated with Rocephin.  Recent UA and culture were obtained and both were negative  Labs: TSH and ammonia wnl, B12 wnl, RPR -, HIV -. Labs were repeated last Friday. LFTs including GGT were within the normal limits. Amylase was within the normal limits. All electrolytes were within the normal limits. CBC showed white blood cells of 13.  Patient's vital signs have been within the normal limits.  Imaging: Head CT was within the normal limits. Abdominal CT was obtained during the first week of her hospitalization as she developed acute abdominal pain. The only abnormality found in the CT of the abdomen was a possible cystitis.  Precautions and every 15 minute checks   Discharge planning: Once stable the patient will be discharged back to her husband house. She will be scheduled to follow-up with [facility].  I will discuss case with family today did feel patient is improved we might consider discharge later this week.  Will consider ordering a outpatient commitment for a 180 days.  Spoke with patient's husband today.  Says she was less agitated over the weekend he he was finally able to visit her for about an hour. She wrote notes for all family members, however one of the notes was  for a man  that doesn't exist.  Patient husbands explained that patient has been saying that there is a man that she has 2 sons with.  Mr. Callegari says that that is not true.    On September 23 I spoke with [facility] staff and requested for them to contact me with information from her visits there. [facility] reports that the patient only went there twice back in July. They diagnosed her with depression. They do not describe  any kind of psychotic symptomatology, the only note was that her mood at times was incongruent. She then returned a second time to see the therapist but then after that and never returned.       Jimmy Footman 09/06/2015, 12:34 PM

## 2015-09-07 NOTE — Progress Notes (Signed)
[facility] MD Progress Note  09/07/2015 9:26 AM Tricia Kerr  MRN:  [id number]  Subjective: Patient was seen in group this morning. She has been calm, and cooperative. She has been sleeping and eating well. She has been compliant with medications. She has been leaving her room more often and has been seen in groups frequently.   Patient was told that after discussing the case with her family yesterday we are thinking about possible discharge later this week. We also discussed the need for a 180 days of outpatient commitment patient was in agreement with that and did not have any concerns about it.  Patient states that she will be willing to follow-up with [facility] once discharged. She denies any side effects from medications. She denies having any physical complaints (denies nausea vomiting ,diarrhea or abdominal pain).  Per nursing staff she continues to voice bizarre delusions.  However they seem less frequent.   Per nursing: D: Patient alert and oriented x4. Patient denies SI/HI/AVH. Patient found tearful in room stating "I don't know while I'm still here..I just want to leave." Patient still endorses existence of diamonds, copper and gold in abdomen and 8 broken ribs. Patient also still believes that her husband previously annulled their marriage.Patient states "Dr. Dareen Piano is the only one I trust" and believes she is receiving no benefit from her current hospitalization.   A: Offered Ibuprofen and tylenol pain meds prn. Actively listened to patients concerns. Tried to explain current rational behind her continued hospitalization. Suggested that patient be open to trusting staff on unit.  R: Patient did not want Ibuprofen nor tylenol stating "that won't do anything." Patient continued to endorse distrust in staff and disagreement in current hospitalization. Will continue to q15 min. Checks.  Principal Problem: Schizophreniform disorder Diagnosis:   Patient Active Problem List   Diagnosis Date Noted  .  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 Tobacco use disorder [Z72.0] 08/23/2015  . Cannabis use disorder, severe, dependence [F12.20] 08/23/2015   Total Time spent with patient: 30 minutes   Past Medical History:  Past Medical History  Diagnosis Date  . Anxiety   . Bipolar disorder     Past Surgical History  Procedure Laterality Date  . Appendectomy    . Tubial    . Tubal ligation     Family History: History reviewed. No pertinent family history. Social History:  History  Alcohol Use  . Yes     History  Drug Use  . 3.00 per week  . Special: Marijuana    Social History   Social History  . Marital Status: Single    Spouse Name: N/A  . Number of Children: N/A  . Years of Education: N/A   Social History Main Topics  . Smoking status: Current Every Day Smoker -- 1.00 packs/day    Types: Cigarettes  . Smokeless tobacco: Never Used  . Alcohol Use: Yes  . Drug Use: 3.00 per week    Special: Marijuana  . Sexual Activity: Not Currently   Other Topics Concern  . None   Social History Narrative   Additional History:    Sleep: Good  Appetite:  Good   Assessment:   Musculoskeletal: Strength & Muscle Tone: within normal limits Gait & Station: normal Patient leans: N/A   Psychiatric Specialty Exam: Physical Exam  Nursing note and vitals reviewed. Constitutional: She is oriented to person, place, and time. She appears well-developed and well-nourished.  HENT:  Head: Normocephalic and atraumatic.  Eyes: Conjunctivae and EOM are  normal.  Neck: Normal range of motion.  Respiratory: Effort normal.  Musculoskeletal: Normal range of motion.  Neurological: She is alert and oriented to person, place, and time.    Review of Systems  Constitutional: Negative.   HENT: Negative.   Eyes: Negative.   Respiratory: Negative.   Cardiovascular: Negative.   Gastrointestinal: Negative.   Skin: Negative.   Neurological: Negative.   Endo/Heme/Allergies:  Negative.   Psychiatric/Behavioral: Negative.   All other systems reviewed and are negative.   Blood pressure 104/69, pulse 101, temperature 97.9 F (36.6 C), temperature source Oral, resp. rate 20, height 5' (1.524 m), weight 44.453 kg (98 lb), SpO2 99 %.Body mass index is 19.14 kg/(m^2).  General Appearance: Casual  Eye Contact::  Fair  Speech:  Clear and Coherent  Volume:  Normal  Mood:  Dysphoric  Affect:  Flat  Thought Process:  Goal Directed  Orientation:  Full (Time, Place, and Person)  Thought Content:  Delusions and Paranoid Ideation  Suicidal Thoughts:  No  Homicidal Thoughts:  No  Memory:  Immediate;   Fair Recent;   Fair Remote;   Fair  Judgement:  Poor  Insight:  Lacking  Psychomotor Activity:  Normal  Concentration:  Fair  Recall:  Poor  Fund of Knowledge:Fair  Language: Fair  Akathisia:  No  Handed:  Right  AIMS (if indicated):     Assets:  Communication Skills Desire for Improvement  ADL's:  Intact  Cognition: WNL  Sleep:  Number of Hours: 7     Current Medications: Current Facility-Administered Medications  Medication Dose Route Frequency Pj Zehner Last Rate Last Dose  . acetaminophen (TYLENOL) tablet 650 mg  650 mg Oral Q6H PRN Beau Fanny, MD      . alum & mag hydroxide-simeth (MAALOX/MYLANTA) 200-200-20 MG/5ML suspension 30 mL  30 mL Oral Q4H PRN Beau Fanny, MD      . feeding supplement (ENSURE ENLIVE) (ENSURE ENLIVE) liquid 237 mL  237 mL Oral TID PC & HS Jimmy Footman, MD   237 mL at 09/07/15 0901  . ibuprofen (ADVIL,MOTRIN) tablet 600 mg  600 mg Oral Q6H PRN Jimmy Footman, MD   600 mg at 09/04/15 1527  . LORazepam (ATIVAN) tablet 0.5 mg  0.5 mg Oral BID Jimmy Footman, MD   0.5 mg at 09/07/15 0910  . LORazepam (ATIVAN) tablet 2 mg  2 mg Oral Q6H PRN Jimmy Footman, MD   2 mg at 09/05/15 2231  . magnesium hydroxide (MILK OF MAGNESIA) suspension 30 mL  30 mL Oral Daily PRN Beau Fanny, MD       . metoprolol tartrate (LOPRESSOR) tablet 12.5 mg  12.5 mg Oral BID Jimmy Footman, MD   12.5 mg at 09/07/15 0908  . multivitamin with minerals tablet 1 tablet  1 tablet Oral Daily Jimmy Footman, MD   1 tablet at 09/07/15 0908  . ondansetron (ZOFRAN-ODT) disintegrating tablet 8 mg  8 mg Oral Q8H PRN Jimmy Footman, MD   8 mg at 09/02/15 0919  . risperiDONE (RISPERDAL M-TABS) disintegrating tablet 3 mg  3 mg Oral BID Jimmy Footman, MD   3 mg at 09/06/15 2112    Lab Results: No results found for this or any previous visit (from the past 48 hour(s)).  Physical Findings: AIMS: Facial and Oral Movements Muscles of Facial Expression: None, normal Lips and Perioral Area: None, normal Jaw: None, normal Tongue: None, normal,Extremity Movements Upper (arms, wrists, hands, fingers): None, normal Lower (legs, knees, ankles, toes): None, normal,  Trunk Movements Neck, shoulders, hips: None, normal, Overall Severity Severity of abnormal movements (highest score from questions above): None, normal Incapacitation due to abnormal movements: None, normal Patient's awareness of abnormal movements (rate only patient's report): Aware, no distress, Dental Status Current problems with teeth and/or dentures?: No Does patient usually wear dentures?: No  CIWA:  CIWA-Ar Total: 6 COWS:     Treatment Plan Summary: Daily contact with patient to assess and evaluate symptoms and progress in treatment and Medication management   Medical Decision Making:  Established Problem, Stable/Improving (1), Review of Psycho-Social Stressors (1), Review or order clinical lab tests (1), Review of Medication Regimen & Side Effects (2) and Review of New Medication or Change in Dosage (2)   29 y/o wf with new onset psychosis. According to the emergency department on the involuntary commitment by her family as she has been very disorganized for the last 5 weeks. Over this past week and  the patient was becoming agitated and aggressive.  Unspecified psychotic disorder/schizophreniform disorder: Patient seems to be improving since is started on Risperdal 2 mg by mouth twice a day. Nausea and vomiting are now resolved. Patient has been eating well since yesterday. On 9/26 Risperdal was increased to 3 mg by mouth twice a day.  -Patient was initially tried on olanzapine which was titrated up to 30 mg a day. The patient had no response or improvement with olanzapine the spice taking this medication for about 2 weeks. In addition patient developed severe nausea and vomiting which is now resolved since olanzapine was discontinued.  Agitation: I will order Ativan 2 mg by mouth every 6 hours as needed  Anxiety: continue ativan 0.5 mg po bid  Tachycardia: During the past week the patient had a heart rate between 100 -120. She was started on metoprolol 12.5 twice a day.  Heart rate is now much improved as patient is eating regularly. Slightly tachycardia secondary to the use of anti-psychotics.  Tobacco use disorder: Patient states she does not need the gum and claims to be allergic to the nicotine patch  Vomiting/nausea: Now resolved. Appears that the nausea and vomiting were secondary to olanzapine.  Poor oral intake: During the last 2 weeks her oral intake was poor. Over the last today's patient has been eating about 100% of her meals.  UTI: Diagnosed with UTI-cystitis. Patient was treated with Rocephin.  Recent UA and culture were obtained and both were negative  Labs: TSH and ammonia wnl, B12 wnl, RPR -, HIV -. Labs were repeated last Friday. LFTs including GGT were within the normal limits. Amylase was within the normal limits. All electrolytes were within the normal limits. CBC showed white blood cells of 13.  Patient's vital signs have been within the normal limits.  Imaging: Head CT was within the normal limits. Abdominal CT was obtained during the first week of her  hospitalization as she developed acute abdominal pain. The only abnormality found in the CT of the abdomen was a possible cystitis.  Precautions and every 15 minute checks   Discharge planning: Once stable the patient will be discharged back to her husband house. She will be scheduled to follow-up with [facility].  After discussion with the family yesterday we might consider discharge later this week under outpatient commitment for 180 days to [facility]  Spoke with patient's husband 9/26.  Says she was less agitated over the weekend he he was finally able to visit her for about an hour. She wrote notes for all family members,  however one of the notes was  for a man that doesn't exist.  Patient husbands explained that patient has been saying that there is a man that she has 2 sons with.  Mr. Chavis says that that is not true.    On September 23 I spoke with [facility] staff and requested for them to contact me with information from her visits there. [facility] reports that the patient only went there twice back in July. They diagnosed her with depression. They do not describe any kind of psychotic symptomatology, the only note was that her mood at times was incongruent. She then returned a second time to see the therapist but then after that and never returned.       Jimmy Footman 09/07/2015, 9:26 AM

## 2015-09-07 NOTE — Progress Notes (Signed)
Patient with bright affect and cooperative behavior with meals, meds and plan of care. No SI/HI at this time. Good adls, fair appetite. Patient encouraged to attend therapy groups to learn and initiate coping skills for management of stressors and diagnosis. States her "thinking is clearer and her mood is better". Continues to voice chronic delusions rt husband. Writer questioned if patient knows about synthetic marijuana and she states that although "she has not smoked it, when she was pregnant her boyfriend/husband and his friends did smoke it and she watched them bug out on it". States she wants to get better and discharge home to her children. Safety maintained.

## 2015-09-07 NOTE — [facility] Group Notes (Signed)
[facility] Group Notes:  (Nursing/MHT/Case Management/Adjunct)  Date:  09/07/2015  Time:  2:23 PM  Type of Therapy:  Psychoeducational Skills  Participation Level:  Active  Participation Quality:  Appropriate  Affect:  Appropriate  Cognitive:  Appropriate  Insight:  Appropriate  Engagement in Group:  Engaged  Modes of Intervention:  Discussion, Education and Support  Summary of Progress/Problems:  Tricia Kerr 09/07/2015, 2:23 PM

## 2015-09-07 NOTE — [facility] Counselor (Signed)
[facility] LCSW Aftercare Discharge Planning Group Note  09/07/2015 7:28 AM  Participation Quality:  DID not attend  Affect:    Cognitive:    Insight:    Engagement in Group:    Modes of Intervention:    Summary of Progress/Problems:  Cheron Schaumann 09/07/2015, 7:28 AM

## 2015-09-07 NOTE — Progress Notes (Signed)
Recreation Therapy Notes  Date: 09.27.16 Time: 3:00 pm Location: Craft Room  Group Topic: Goal Setting  Goal Area(s) Addresses:  Patient will write at least one goal. Patient will write at least one obstacle.  Behavioral Response: Attentive  Intervention: Recovery Goal Chart  Activity: Patients were instructed to make a goal chart listing goals, obstacles, the date they started working on their goals, and the date they have achieved their goals.   Education: LRT educated patients on healthy ways to celebrate reaching their goals.  Education Outcome: In group clarification offered  Clinical Observations/Feedback: Patient completed activity by listing 3 goals, obstacles, and the date she started working on her goals. Patient did not contribute to group discussion.  Jacquelynn Cree, LRT/CTRS 09/07/2015 4:40 PM

## 2015-09-07 NOTE — [facility] Counselor (Signed)
[facility] LCSW Group Therapy  09/07/2015 7:28 AM  Type of Therapy:  Group Therapy  Participation Level:  Did Not Attend  Participation Quality:    Affect:    Cognitive:    Insight:    Engagement in Therapy:    Modes of Intervention:    Summary of Progress/Problems:  Tricia Kerr 09/07/2015, 7:28 AM

## 2015-09-07 NOTE — Progress Notes (Signed)
D: Patient denies SI/HI/AVH.  Patient affect is appropriate and her mood is pleasant.  Patient did attend evening group. Patient visible on the milieu. No distress noted. A: Support and encouragement offered. Scheduled medications given to pt. Q 15 min checks continued for patient safety. R: Patient receptive. Patient remains safe on the unit.   

## 2015-09-08 MED ORDER — RISPERIDONE 3 MG PO TABS: 3.0000 mg | ORAL_TABLET | Freq: Two times a day (BID) | ORAL | Status: DC

## 2015-09-08 MED ORDER — DIPHENHYDRAMINE HCL 25 MG PO CAPS: 25.0000 mg | ORAL_CAPSULE | Freq: Every day | ORAL | Status: DC

## 2015-09-08 MED ORDER — METOPROLOL TARTRATE 25 MG PO TABS: 12.5000 mg | ORAL_TABLET | Freq: Two times a day (BID) | ORAL | Status: DC

## 2015-09-08 NOTE — Progress Notes (Signed)
[facility] MD Progress Note  09/08/2015 11:01 AM Tricia Kerr  MRN:  [id number]  Subjective: Patient has been calm, friendly and cooperative. She has been compliant with medications. She has been attending groups. She has been eating regularly.  Her delusional thoughts are voiced infrequently now.  She denies problems with his sleep, appetite energy mood or concentration. She denies SI, HI or auditory or visual hallucinations. She denies side effects from medications. She denies having physical complaints.  Patient is not longer referring to her husband as her ex.  We discussed the plans for discharge tomorrow. She was willing to follow up, and she was not argumentative. She received education about her diagnosis and medications. She stated that she was planning on continuing the treatment. We discussed the plan for the outpatient commitment and she had no concerns about it.  Per nursing: Patient with bright affect and cooperative behavior with meals, meds and plan of care. No SI/HI at this time. Good adls, fair appetite. Patient encouraged to attend therapy groups to learn and initiate coping skills for management of stressors and diagnosis. States her "thinking is clearer and her mood is better". Continues to voice chronic delusions rt husband. Writer questioned if patient knows about synthetic marijuana and she states that although "she has not smoked it, when she was pregnant her boyfriend/husband and his friends did smoke it and she watched them bug out on it". States she wants to get better and discharge home to her children. Safety maintained.   Principal Problem: Schizophreniform disorder Diagnosis:   Patient Active Problem List   Diagnosis Date Noted  . 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 Tobacco use disorder [Z72.0] 08/23/2015  . Cannabis use disorder, severe, dependence [F12.20] 08/23/2015   Total Time spent with patient: 30 minutes   Past Medical History:  Past Medical  History  Diagnosis Date  . Anxiety   . Bipolar disorder     Past Surgical History  Procedure Laterality Date  . Appendectomy    . Tubial    . Tubal ligation     Family History: History reviewed. No pertinent family history. Social History:  History  Alcohol Use  . Yes     History  Drug Use  . 3.00 per week  . Special: Marijuana    Social History   Social History  . Marital Status: Single    Spouse Name: N/A  . Number of Children: N/A  . Years of Education: N/A   Social History Main Topics  . Smoking status: Current Every Day Smoker -- 1.00 packs/day    Types: Cigarettes  . Smokeless tobacco: Never Used  . Alcohol Use: Yes  . Drug Use: 3.00 per week    Special: Marijuana  . Sexual Activity: Not Currently   Other Topics Concern  . None   Social History Narrative   Additional History:    Sleep: Good  Appetite:  Good   Assessment:   Musculoskeletal: Strength & Muscle Tone: within normal limits Gait & Station: normal Patient leans: N/A   Psychiatric Specialty Exam: Physical Exam  Nursing note and vitals reviewed. Constitutional: She is oriented to person, place, and time. She appears well-developed and well-nourished.  HENT:  Head: Normocephalic and atraumatic.  Eyes: Conjunctivae and EOM are normal.  Neck: Normal range of motion.  Respiratory: Effort normal.  Musculoskeletal: Normal range of motion.  Neurological: She is alert and oriented to person, place, and time.    Review of Systems  Constitutional: Negative.   HENT:  Negative.   Eyes: Negative.   Respiratory: Negative.   Cardiovascular: Negative.   Gastrointestinal: Negative.   Skin: Negative.   Neurological: Negative.   Endo/Heme/Allergies: Negative.   Psychiatric/Behavioral: Negative.   All other systems reviewed and are negative.   Blood pressure 106/72, pulse 83, temperature 97.5 F (36.4 C), temperature source Oral, resp. rate 20, height 5' (1.524 m), weight 44.453 kg (98 lb),  SpO2 99 %.Body mass index is 19.14 kg/(m^2).  General Appearance: Casual  Eye Contact::  Fair  Speech:  Clear and Coherent  Volume:  Normal  Mood:  Euthymic  Affect:  Congruent  Thought Process:  Goal Directed  Orientation:  Full (Time, Place, and Person)  Thought Content:  Delusions and Hallucinations: None  Suicidal Thoughts:  No  Homicidal Thoughts:  No  Memory:  Immediate;   Fair Recent;   Fair Remote;   Fair  Judgement:  Fair  Insight:  Shallow  Psychomotor Activity:  Normal  Concentration:  NA  Recall:  NA  Fund of Knowledge:Good  Language: Good  Akathisia:  No  Handed:    AIMS (if indicated):     Assets:  Communication Skills Desire for Improvement Housing Intimacy Physical Health Social Support  ADL's:  Intact  Cognition: WNL  Sleep:  Number of Hours: 6     Current Medications: Current Facility-Administered Medications  Medication Dose Route Frequency Provider Last Rate Last Dose  . acetaminophen (TYLENOL) tablet 650 mg  650 mg Oral Q6H PRN Beau Fanny, MD      . alum & mag hydroxide-simeth (MAALOX/MYLANTA) 200-200-20 MG/5ML suspension 30 mL  30 mL Oral Q4H PRN Beau Fanny, MD      . feeding supplement (ENSURE ENLIVE) (ENSURE ENLIVE) liquid 237 mL  237 mL Oral TID PC & HS Jimmy Footman, MD   237 mL at 09/08/15 0900  . ibuprofen (ADVIL,MOTRIN) tablet 600 mg  600 mg Oral Q6H PRN Jimmy Footman, MD   600 mg at 09/04/15 1527  . LORazepam (ATIVAN) tablet 0.5 mg  0.5 mg Oral BID Jimmy Footman, MD   0.5 mg at 09/08/15 0913  . LORazepam (ATIVAN) tablet 2 mg  2 mg Oral Q6H PRN Jimmy Footman, MD   2 mg at 09/05/15 2231  . magnesium hydroxide (MILK OF MAGNESIA) suspension 30 mL  30 mL Oral Daily PRN Beau Fanny, MD      . metoprolol tartrate (LOPRESSOR) tablet 12.5 mg  12.5 mg Oral BID Jimmy Footman, MD   12.5 mg at 09/08/15 0913  . multivitamin with minerals tablet 1 tablet  1 tablet Oral Daily Jimmy Footman, MD   1 tablet at 09/08/15 0913  . ondansetron (ZOFRAN-ODT) disintegrating tablet 8 mg  8 mg Oral Q8H PRN Jimmy Footman, MD   8 mg at 09/02/15 0919  . risperiDONE (RISPERDAL M-TABS) disintegrating tablet 3 mg  3 mg Oral BID Jimmy Footman, MD   3 mg at 09/08/15 2952    Lab Results: No results found for this or any previous visit (from the past 48 hour(s)).  Physical Findings: AIMS: Facial and Oral Movements Muscles of Facial Expression: None, normal Lips and Perioral Area: None, normal Jaw: None, normal Tongue: None, normal,Extremity Movements Upper (arms, wrists, hands, fingers): None, normal Lower (legs, knees, ankles, toes): None, normal, Trunk Movements Neck, shoulders, hips: None, normal, Overall Severity Severity of abnormal movements (highest score from questions above): None, normal Incapacitation due to abnormal movements: None, normal Patient's awareness of abnormal movements (rate only patient's  report): Aware, no distress, Dental Status Current problems with teeth and/or dentures?: No Does patient usually wear dentures?: No  CIWA:  CIWA-Ar Total: 6 COWS:     Treatment Plan Summary: Daily contact with patient to assess and evaluate symptoms and progress in treatment and Medication management   Medical Decision Making:  Established Problem, Stable/Improving (1), Review of Psycho-Social Stressors (1), Review or order clinical lab tests (1), Review of Medication Regimen & Side Effects (2) and Review of New Medication or Change in Dosage (2)   29 y/o wf with new onset psychosis. According to the emergency department on the involuntary commitment by her family as she has been very disorganized for the last 5 weeks.  1 week prior to admission patient  became agitated and aggressive.  Unspecified psychotic disorder/schizophreniform disorder: Patient seems to be improving since is started on Risperdal.  Nausea and vomiting are now  resolved. Patient has been eating well since yesterday. On 9/26 Risperdal was increased to 3 mg by mouth twice a day.  -Patient was initially tried on olanzapine which was titrated up to 30 mg a day. The patient had no response or improvement with olanzapine the spice taking this medication for about 2 weeks. In addition patient developed severe nausea and vomiting which is now resolved since olanzapine was discontinued.  Agitation: Continue Ativan every 6 hours when necessary  Anxiety: continue ativan 0.5 mg po bid  Tachycardia: During the past week the patient had a heart rate between 100 -120. She was started on metoprolol 12.5 twice a day.  Heart rate is now much improved as patient is eating regularly. Slightly tachycardia secondary to the use of anti-psychotics.  Tobacco use disorder: Patient states she does not need the gum and claims to be allergic to the nicotine patch  Vomiting/nausea: Now resolved. Appears that the nausea and vomiting were secondary to olanzapine.  Poor oral intake: During the last 2 weeks her oral intake was poor. Patient is now eating regularly.  UTI: Diagnosed with UTI-cystitis. Patient was treated with Rocephin.  Recent UA and culture were obtained and both were negative  Labs: TSH and ammonia wnl, B12 wnl, RPR -, HIV -. Labs were repeated last Friday. LFTs including GGT were within the normal limits. Amylase was within the normal limits. All electrolytes were within the normal limits. CBC showed white blood cells of 13.  Patient's vital signs have been within the normal limits.  Imaging: Head CT was within the normal limits. Abdominal CT was obtained during the first week of her hospitalization as she developed acute abdominal pain. The only abnormality found in the CT of the abdomen was a possible cystitis.  Precautions and every 15 minute checks   Discharge planning: Plan to discharge tomorrow after having a family meeting.  After discussion with the  family with plan to discharge patient tomorrow on the 180 days of outpatient commitment to [facility]  Spoke with patient's husband 9/26.  Says she was less agitated over the weekend he he was finally able to visit her for about an hour. She wrote notes for all family members, however one of the notes was  for a man that doesn't exist.  Patient husbands explained that patient has been saying that there is a man that she has 2 sons with.  Mr. Rishel says that that is not true.    On September 23 I spoke with [facility] staff and requested for them to contact me with information from her visits there. [facility]  reports that the patient only went there twice back in July. They diagnosed her with depression. They do not describe any kind of psychotic symptomatology, the only note was that her mood at times was incongruent. She then returned a second time to see the therapist but then after that and never returned.    Jimmy Footman 09/08/2015, 11:01 AM

## 2015-09-08 NOTE — Progress Notes (Signed)
  D) Patient pleasant and cooperative . Patient denies SI/HI, denies A/V hallucinations.   A) Patient offered support and encouragement, patient encouraged to discuss feelings/concerns with staff. Patient verbalized understanding. Patient monitored Q15 minutes for safety. Patient met with MD  to discuss today's goals and plan of care.  R) Patient visible in milieu, attending groups in day room and meals in dining room. Patient appropriate with staff and peers.   Patient taking medications as ordered. Will continue to monitor.

## 2015-09-08 NOTE — [facility] Group Notes (Signed)
[facility] LCSW Group Therapy  09/08/2015 3:56 PM  Type of Therapy:  Group Therapy  Participation Level:  Active  Participation Quality:  Appropriate and Attentive  Affect:  Appropriate  Cognitive:  Alert, Appropriate and Oriented  Insight:  Engaged  Engagement in Therapy:  Engaged  Modes of Intervention:  Socialization and Support  Summary of Progress/Problems: Patient introduced herself and shared during introductions that if she were stranded on a deserted [place], the 2 things she would bring is "2 guy friends and my music". Patient participated in group discussion appropriately and shared that music is a coping tool that she uses.   Lulu Riding, MSW, LCSWA 09/08/2015, 3:56 PM

## 2015-09-08 NOTE — Tx Team (Signed)
Interdisciplinary Treatment Plan Update (Adult)  Date:  09/08/2015 Time Reviewed:  12:36 PM  Progress in Treatment: Attending groups: Yes. More frequently Participating in groups:  Yes. Taking medication as prescribed:  Yes. Tolerating medication:  Yes. Family/Significant othe contact made:  Yes, individual(s) contacted:  husbanad Patient understands diagnosis:  No.Continues to have poor insight into delusional beliefs, although she recognizes she was very rude on admission and apologizes to staff for this. Discussing patient identified problems/goals with staff:  Yes. Medical problems stabilized or resolved:  Yes. Denies suicidal/homicidal ideation: Yes. Issues/concerns per patient self-inventory:  No. Other:  New problem(s) identified: No, Describe:     Discharge Plan or Barriers: Return home with family, follow up with [facility] on outpatient commitment.  Reason for Continuation of Hospitalization: Delusions  Depression Medication stabilization  Comments:  Pt is out of room more, attending and participating in groups.  Still having some delusions about her husband being her ex- and others. She is more cooperative and less volatile with physician.  Husband verbalizes improvement and that she still verbalizes some delusions but is able to talk with her and reason with her more.  Estimated length of stay: up to 2 days  New goal(s):  Review of initial/current patient goals per problem list:   See Plan of Care  Attendees: Patient:  Tricia Kerr 9/28/201612:36 PM  Family:   9/28/201612:36 PM  Physician:  Radene Journey 9/28/201612:36 PM  Nursing:    9/28/201612:36 PM  Case Manager:   9/28/201612:36 PM  Counselor:  Jake Shark, LCSW 9/28/201612:36 PM  Other:  Hershal Coria, LRT 9/28/201612:36 PM  Other:  Jerry Caras RN 9/28/201612:36 PM  Other:   9/28/201612:36 PM  Other:  9/28/201612:36 PM  Other:  9/28/201612:36 PM  Other:  9/28/201612:36 PM  Other:  9/28/201612:36 PM  Other:   9/28/201612:36 PM  Other:  9/28/201612:36 PM  Other:   9/28/201612:36 PM   Scribe for Treatment Team:   Glennon Mac, 09/08/2015, 12:36 PM

## 2015-09-08 NOTE — Progress Notes (Signed)
Recreation Therapy Notes  Date: 09.28.16 Time: 3:00 pm Location: Day Room  Group Topic: Self-esteem  Goal Area(s) Addresses:  Patient will write at least one positive trait. Patient will verbalize benefit of having a good self-esteem.  Behavioral Response: Attentive  Intervention: I Am  Activity: Patients were given a worksheet with the letter I on it and instructed to fill the letter with positive traits about themselves.  Education:LRT educated patients on ways they can increase their self-esteem.  Education Outcome: In group clarification offered   Clinical Observations/Feedback: Patient completed activity by writing positive traits down. Patient did not contribute to group discussion.  Jacquelynn Cree, LRT/CTRS 09/08/2015 4:49 PM

## 2015-09-08 NOTE — Plan of Care (Signed)
Problem: Alteration in mood Goal: LTG-Pt's behavior demonstrates decreased signs of depression (Patient's behavior demonstrates decreased signs of depression to the point the patient is safe to return home and continue treatment in an outpatient setting)  Outcome: Progressing She is pleasant & cooperative.

## 2015-09-08 NOTE — [facility] Group Notes (Signed)
[facility] Group Notes:  (Nursing/MHT/Case Management/Adjunct)  Date:  09/08/2015  Time:  1:46 PM  Type of Therapy:  Psychoeducational Skills  Participation Level:  Active  Participation Quality:  Appropriate  Affect:  Appropriate  Cognitive:  Appropriate  Insight:  Appropriate  Engagement in Group:  Supportive  Modes of Intervention:  Clarification  Summary of Progress/Problems:  Marquette Old 09/08/2015, 1:46 PM

## 2015-09-08 NOTE — Progress Notes (Signed)
D: Patient denies SI/HI/AVH.  Patient affect is appropriate and her mood is pleasant.  Patient did attend evening group. Patient visible on the milieu. No distress noted. A: Support and encouragement offered. Scheduled medications given to pt. Q 15 min checks continued for patient safety. R: Patient receptive. Patient remains safe on the unit.   

## 2015-09-09 NOTE — Progress Notes (Signed)
D: Patient denies SI/HI/AVH.  Patient affect is appropriate and her mood is pleasant.  Patient did attend evening group. Patient visible on the milieu. No distress noted. A: Support and encouragement offered. Scheduled medications given to pt. Q 15 min checks continued for patient safety. R: Patient receptive. Patient remains safe on the unit.   

## 2015-09-09 NOTE — [facility] Suicide Risk Assessment (Signed)
[facility] Discharge Suicide Risk Assessment   Demographic Factors:  Caucasian  Total Time spent with patient: 30 minutes  Musculoskeletal: Strength & Muscle Tone: within normal limits Gait & Station: normal Patient leans: N/A  Psychiatric Specialty Exam: Physical Exam  Constitutional: She is oriented to person, place, and time. She appears well-developed and well-nourished.  HENT:  Head: Normocephalic and atraumatic.  Eyes: Conjunctivae and EOM are normal.  Neck: Normal range of motion.  Musculoskeletal: Normal range of motion.  Neurological: She is alert and oriented to person, place, and time.    Review of Systems  Constitutional: Negative.   HENT: Negative.   Eyes: Negative.   Respiratory: Negative.   Cardiovascular: Negative.   Gastrointestinal: Negative.   Genitourinary: Negative.   Musculoskeletal: Negative.   Skin: Negative.   Neurological: Negative.   Endo/Heme/Allergies: Negative.   Psychiatric/Behavioral: Negative.     Blood pressure 102/68, pulse 88, temperature 98.6 F (37 C), temperature source Oral, resp. rate 20, height 5' (1.524 m), weight 44.453 kg (98 lb), SpO2 99 %.Body mass index is 19.14 kg/(m^2).  General Appearance: Fairly Groomed  Patent attorney::  Good  Speech:  Clear and Coherent409  Volume:  Normal  Mood:  Euthymic  Affect:  Congruent  Thought Process:  Linear  Orientation:  Full (Time, Place, and Person)  Thought Content:  Hallucinations: None  Suicidal Thoughts:  No  Homicidal Thoughts:  No  Memory:  Immediate;   Good Recent;   Good Remote;   Good  Judgement:  Fair  Insight:  Shallow  Psychomotor Activity:  Normal  Concentration:  NA  Recall:  NA  Fund of Knowledge:Good  Language: Good  Akathisia:  No  Handed:    AIMS (if indicated):     Assets:  Architect Housing Intimacy Physical Health Social Support  Sleep:  Number of Hours: 8  Cognition: WNL  ADL's:  Intact   Have you used any  form of tobacco in the last 30 days? (Cigarettes, Smokeless Tobacco, Cigars, and/or Pipes): Yes  Has this patient used any form of tobacco in the last 30 days? (Cigarettes, Smokeless Tobacco, Cigars, and/or Pipes) Yes, A prescription for an FDA-approved tobacco cessation medication was offered at discharge and the patient refused  Mental Status Per Nursing Assessment::   On Admission:  NA  Current Mental Status by Physician: Denies SI, HI, auditory or visual hallucinations, no evidence of delusional thinking during assessment. Mood euthymic To bright and reactive. Patient is calm, pleasant and cooperative. No evidence of anxiety, akathisia, aggression or agitation. Denies side effects from medications. Denies physical complaints. Has a supportive family. Is responsible for children under the age of 36  Loss Factors: NA  Historical Factors: NA  Risk Reduction Factors:   Responsible for children under 33 years of age, Sense of responsibility to family, Employed, Living with another person, especially a relative and Positive social support  Continued Clinical Symptoms:  Currently Psychotic Previous Psychiatric Diagnoses and Treatments  Cognitive Features That Contribute To Risk:  None    Suicide Risk:  Minimal: No identifiable suicidal ideation.  Patients presenting with no risk factors but with morbid ruminations; may be classified as minimal risk based on the severity of the depressive symptoms  Principal Problem: Schizophreniform disorder Discharge Diagnoses:  Patient Active Problem List   Diagnosis Date Noted  . 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 Tobacco use disorder [Z72.0] 08/23/2015  . Cannabis use disorder, severe, dependence [F12.20] 08/23/2015    Follow-up Information  Follow up with [facility].   Why:  Hospital Follow up, Outpatient Medication Management, Therapy, Walk-in Monday - Friday between 8am-3pm   Contact information:   [address] [place] [address] [phone] Fax      Is patient on multiple antipsychotic therapies at discharge:  No   Has Patient had three or more failed trials of antipsychotic monotherapy by history:  No  Recommended Plan for Multiple Antipsychotic Therapies: NA    Jimmy Footman 09/09/2015, 9:35 AM

## 2015-09-09 NOTE — Progress Notes (Signed)
  [facility] Adult Case Management Discharge Plan :  Will you be returning to the same living situation after discharge:  Yes,  Home with husband  At discharge, do you have transportation home?: Yes,  Husband  Do you have the ability to pay for your medications: Yes,  Medication management clinic   Release of information consent forms completed and in the chart;  Patient's signature needed at discharge.  Patient to Follow up at: Follow-up Information    Follow up with [facility].   Why:  Hospital Follow up, Outpatient Medication Management, Therapy, Walk-in Monday - Friday between 8am-3pm   Contact information:   [address] [place] [address] [phone] Fax      Patient denies SI/HI: Yes,  Yes     Safety Planning and Suicide Prevention discussed: Yes,  With patient and husband   Have you used any form of tobacco in the last 30 days? (Cigarettes, Smokeless Tobacco, Cigars, and/or Pipes): Yes  Has patient been referred to the Quitline?: Patient refused referral  Rondall Allegra MSW, LCSWA  09/09/2015, 10:11 AM
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
## 2015-09-09 NOTE — Progress Notes (Signed)
Pleasant and cooperative.  Smiles easily.  Verbalizes that she is being discharged today and she is excited about being able to see her children.   Denies any depression or SI.  Verbalized that her appetite has improved and feels that she has gained weight since being here.  Further states that she did not drink ensure this am because she did not receive one with her breakfast try but she ate all of her breakfast.  Discharge instructions given, verbalized understanding.  Prescriptions and seven day supply of medications given.   Personal belongings returned.  Escorted off unit by this Clinical research associate to meet husband to travel home.

## 2015-09-20 ENCOUNTER — Ambulatory Visit: Payer: Self-pay

## 2017-03-20 ENCOUNTER — Encounter: Payer: Self-pay | Admitting: Emergency Medicine

## 2017-03-20 ENCOUNTER — Observation Stay
Admission: EM | Admit: 2017-03-20 | Discharge: 2017-03-21 | Disposition: A | Discharge status: Home / Self Care | Payer: Self-pay | Attending: Internal Medicine | Admitting: Internal Medicine

## 2017-03-20 ENCOUNTER — Emergency Department: Payer: Self-pay

## 2017-03-20 DIAGNOSIS — F319 Bipolar disorder, unspecified: Secondary | ICD-10-CM | POA: Insufficient documentation

## 2017-03-20 DIAGNOSIS — F17210 Nicotine dependence, cigarettes, uncomplicated: Secondary | ICD-10-CM | POA: Insufficient documentation

## 2017-03-20 DIAGNOSIS — R112 Nausea with vomiting, unspecified: Principal | ICD-10-CM | POA: Diagnosis present

## 2017-03-20 DIAGNOSIS — F1220 Cannabis dependence, uncomplicated: Secondary | ICD-10-CM | POA: Insufficient documentation

## 2017-03-20 DIAGNOSIS — Z9049 Acquired absence of other specified parts of digestive tract: Secondary | ICD-10-CM | POA: Insufficient documentation

## 2017-03-20 DIAGNOSIS — F419 Anxiety disorder, unspecified: Secondary | ICD-10-CM | POA: Insufficient documentation

## 2017-03-20 DIAGNOSIS — E876 Hypokalemia: Secondary | ICD-10-CM | POA: Insufficient documentation

## 2017-03-20 DIAGNOSIS — Z9851 Tubal ligation status: Secondary | ICD-10-CM | POA: Insufficient documentation

## 2017-03-20 DIAGNOSIS — F2081 Schizophreniform disorder: Secondary | ICD-10-CM | POA: Insufficient documentation

## 2017-03-20 DIAGNOSIS — G43A0 Cyclical vomiting, not intractable: Secondary | ICD-10-CM

## 2017-03-20 DIAGNOSIS — R1010 Upper abdominal pain, unspecified: Secondary | ICD-10-CM

## 2017-03-20 LAB — URINE DRUG SCREEN, QUALITATIVE (ARMC ONLY)
AMPHETAMINES, UR SCREEN: NOT DETECTED
BENZODIAZEPINE, UR SCRN: POSITIVE — AB
Barbiturates, Ur Screen: NOT DETECTED
COCAINE METABOLITE, UR ~~LOC~~: NOT DETECTED
Cannabinoid 50 Ng, Ur ~~LOC~~: POSITIVE — AB
MDMA (ECSTASY) UR SCREEN: NOT DETECTED
METHADONE SCREEN, URINE: NOT DETECTED
OPIATE, UR SCREEN: POSITIVE — AB
Phencyclidine (PCP) Ur S: NOT DETECTED
Tricyclic, Ur Screen: NOT DETECTED

## 2017-03-20 LAB — URINALYSIS, COMPLETE (UACMP) WITH MICROSCOPIC
Bilirubin Urine: NEGATIVE
GLUCOSE, UA: NEGATIVE mg/dL
Hgb urine dipstick: NEGATIVE
Ketones, ur: 20 mg/dL — AB
Leukocytes, UA: NEGATIVE
NITRITE: NEGATIVE
PH: 9 — AB (ref 5.0–8.0)
Protein, ur: 30 mg/dL — AB
SPECIFIC GRAVITY, URINE: 1.017 (ref 1.005–1.030)
WBC, UA: NONE SEEN WBC/hpf (ref 0–5)

## 2017-03-20 LAB — CBC
HCT: 44.8 % (ref 35.0–47.0)
HEMOGLOBIN: 15.2 g/dL (ref 12.0–16.0)
MCH: 30.2 pg (ref 26.0–34.0)
MCHC: 34 g/dL (ref 32.0–36.0)
MCV: 88.9 fL (ref 80.0–100.0)
PLATELETS: 355 10*3/uL (ref 150–440)
RBC: 5.04 MIL/uL (ref 3.80–5.20)
RDW: 13.3 % (ref 11.5–14.5)
WBC: 18 10*3/uL — ABNORMAL HIGH (ref 3.6–11.0)

## 2017-03-20 LAB — COMPREHENSIVE METABOLIC PANEL
ALBUMIN: 4.6 g/dL (ref 3.5–5.0)
ALK PHOS: 102 U/L (ref 38–126)
ALT: 38 U/L (ref 14–54)
AST: 49 U/L — ABNORMAL HIGH (ref 15–41)
Anion gap: 14 (ref 5–15)
BUN: 7 mg/dL (ref 6–20)
CALCIUM: 9.6 mg/dL (ref 8.9–10.3)
CO2: 19 mmol/L — AB (ref 22–32)
CREATININE: 0.63 mg/dL (ref 0.44–1.00)
Chloride: 105 mmol/L (ref 101–111)
GFR calc Af Amer: >60 mL/min (ref >60)
GFR calc non Af Amer: >60 mL/min (ref >60)
GLUCOSE: 165 mg/dL — AB (ref 65–99)
Potassium: 3.2 mmol/L — ABNORMAL LOW (ref 3.5–5.1)
SODIUM: 138 mmol/L (ref 135–145)
Total Bilirubin: 1.4 mg/dL — ABNORMAL HIGH (ref 0.3–1.2)
Total Protein: 7.6 g/dL (ref 6.5–8.1)

## 2017-03-20 LAB — POCT PREGNANCY, URINE: Preg Test, Ur: NEGATIVE

## 2017-03-20 LAB — LIPASE, BLOOD: Lipase: 17 U/L (ref 11–51)

## 2017-03-20 LAB — ETHANOL: Alcohol, Ethyl (B): <5 mg/dL (ref <5)

## 2017-03-20 MED ORDER — ONDANSETRON HCL 4 MG/2ML IJ SOLN
4.0000 mg | Freq: Four times a day (QID) | INTRAMUSCULAR | Status: DC | PRN
  Administered 2017-03-20 – 2017-03-21 (×2): 4 mg via INTRAVENOUS
  Filled 2017-03-20: qty 2

## 2017-03-20 MED ORDER — ONDANSETRON HCL 4 MG/2ML IJ SOLN
4.0000 mg | Freq: Once | INTRAMUSCULAR | Status: AC
  Administered 2017-03-20: 4 mg via INTRAVENOUS
  Filled 2017-03-20: qty 2

## 2017-03-20 MED ORDER — HYDROMORPHONE HCL 1 MG/ML IJ SOLN
1.0000 mg | Freq: Once | INTRAMUSCULAR | Status: AC
  Administered 2017-03-20: 1 mg via INTRAVENOUS
  Filled 2017-03-20: qty 1

## 2017-03-20 MED ORDER — LORAZEPAM 2 MG/ML IJ SOLN
1.0000 mg | Freq: Once | INTRAMUSCULAR | Status: AC
  Administered 2017-03-20: 1 mg via INTRAVENOUS
  Filled 2017-03-20: qty 1

## 2017-03-20 MED ORDER — IOPAMIDOL (ISOVUE-300) INJECTION 61%
100.0000 mL | Freq: Once | INTRAVENOUS | Status: AC | PRN
  Administered 2017-03-20: 100 mL via INTRAVENOUS

## 2017-03-20 MED ORDER — DICYCLOMINE HCL 20 MG PO TABS
20.0000 mg | ORAL_TABLET | Freq: Once | ORAL | Status: AC
  Administered 2017-03-20: 20 mg via ORAL
  Filled 2017-03-20: qty 1

## 2017-03-20 MED ORDER — DICYCLOMINE HCL 20 MG PO TABS: 20.0000 mg | ORAL_TABLET | Freq: Three times a day (TID) | ORAL | 0 refills | Status: DC | PRN

## 2017-03-20 MED ORDER — ACETAMINOPHEN 325 MG PO TABS: 650.0000 mg | ORAL_TABLET | Freq: Four times a day (QID) | ORAL | Status: DC | PRN

## 2017-03-20 MED ORDER — SODIUM CHLORIDE 0.9 % IV SOLN
Freq: Once | INTRAVENOUS | Status: AC
  Administered 2017-03-20: 08:00:00 via INTRAVENOUS

## 2017-03-20 MED ORDER — ONDANSETRON HCL 4 MG/2ML IJ SOLN
INTRAMUSCULAR | Status: AC
  Filled 2017-03-20: qty 2

## 2017-03-20 MED ORDER — KETOROLAC TROMETHAMINE 30 MG/ML IJ SOLN: 30.0000 mg | Freq: Four times a day (QID) | INTRAMUSCULAR | Status: DC | PRN

## 2017-03-20 MED ORDER — PROMETHAZINE HCL 25 MG PO TABS
25.0000 mg | ORAL_TABLET | Freq: Once | ORAL | Status: DC
  Filled 2017-03-20: qty 1

## 2017-03-20 MED ORDER — PROMETHAZINE HCL 25 MG PO TABS: 25.0000 mg | ORAL_TABLET | ORAL | 1 refills | Status: DC | PRN

## 2017-03-20 MED ORDER — IOPAMIDOL (ISOVUE-300) INJECTION 61%
30.0000 mL | Freq: Once | INTRAVENOUS | Status: AC | PRN
  Administered 2017-03-20: 30 mL via ORAL

## 2017-03-20 MED ORDER — ENOXAPARIN SODIUM 40 MG/0.4ML ~~LOC~~ SOLN
40.0000 mg | SUBCUTANEOUS | Status: DC
  Administered 2017-03-20: 21:00:00 40 mg via SUBCUTANEOUS
  Filled 2017-03-20: qty 0.4

## 2017-03-20 MED ORDER — ACETAMINOPHEN 650 MG RE SUPP: 650.0000 mg | Freq: Four times a day (QID) | RECTAL | Status: DC | PRN

## 2017-03-20 MED ORDER — POTASSIUM CHLORIDE IN NACL 20-0.9 MEQ/L-% IV SOLN
INTRAVENOUS | Status: DC
  Administered 2017-03-20 – 2017-03-21 (×2): via INTRAVENOUS
  Filled 2017-03-20 (×5): qty 1000

## 2017-03-20 MED ORDER — TRAMADOL HCL 50 MG PO TABS
50.0000 mg | ORAL_TABLET | Freq: Four times a day (QID) | ORAL | Status: DC | PRN
  Administered 2017-03-20: 19:00:00 50 mg via ORAL
  Filled 2017-03-20: qty 1

## 2017-03-20 MED ORDER — ONDANSETRON HCL 4 MG PO TABS: 4.0000 mg | ORAL_TABLET | Freq: Four times a day (QID) | ORAL | Status: DC | PRN

## 2017-03-20 NOTE — ED Notes (Signed)
Pt sleeping on side. Lights dimmed for comfort. Blanket covering pt.

## 2017-03-20 NOTE — ED Notes (Signed)
Pt had another episode of vomiting. Refused to take the phenergan.

## 2017-03-20 NOTE — ED Provider Notes (Addendum)
[facility] Emergency Department Provider Note       Time seen: ----------------------------------------- 7:57 AM on 03/20/2017 -----------------------------------------     I have reviewed the triage vital signs and the nursing notes.   HISTORY   Chief Complaint Abdominal Pain    HPI Tricia Kerr is a 31 y.o. female who presents to the ED for abdominal pain since last night accompanied by nausea, vomiting and diarrhea. Patient states the pain is severe, nothing makes it better or worse. Pain seems to be in the mid to upper abdomen. Patient states she was seen here for this 2 years ago but was told it was a colon infection. She has not had it since. She denies alcohol or drug use.   Past Medical History:  Diagnosis Date  . Anxiety   . Bipolar disorder [facility])     Patient Active Problem List   Diagnosis Date Noted  . 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 Tobacco use disorder 08/23/2015  . Cannabis use disorder, severe, dependence (HCC) 08/23/2015    Past Surgical History:  Procedure Laterality Date  . APPENDECTOMY    . TUBAL LIGATION    . tubial      Allergies Nicotine  Social History Social History  Substance Use Topics  . Smoking status: Current Every Day Smoker    Packs/day: 1.00    Types: Cigarettes  . Smokeless tobacco: Never Used  . Alcohol use Yes    Review of Systems Constitutional: Negative for fever. Cardiovascular: Negative for chest pain. Respiratory: Negative for shortness of breath. Gastrointestinal: Positive for abdominal pain, vomiting and diarrhea Musculoskeletal: Negative for back pain. Skin: Negative for rash. Neurological: Negative for headaches, focal weakness or numbness.  10-point ROS otherwise negative.  ____________________________________________   PHYSICAL EXAM:  VITAL SIGNS: ED Triage Vitals  Enc Vitals Group     BP 03/20/17 0643 (!) 113/58     Pulse Rate 03/20/17 0643 87   Resp 03/20/17 0643 (!) 24     Temp 03/20/17 0643 97.7 F (36.5 C)     Temp Source 03/20/17 0643 Oral     SpO2 03/20/17 0643 99 %     Weight 03/20/17 0640 145 lb (65.8 kg)     Height 03/20/17 0640  (1.499 m)     Head Circumference --      Peak Flow --      Pain Score 03/20/17 0640 10     Pain Loc --      Pain Edu? --      Excl. in GC? --     Constitutional: Alert and oriented. Well appearing and in no distress. Eyes: Conjunctivae are normal. PERRL. Normal extraocular movements. ENT   Head: Normocephalic and atraumatic.   Nose: No congestion/rhinnorhea.   Mouth/Throat: Mucous membranes are moist.   Neck: No stridor. Cardiovascular: Normal rate, regular rhythm. No murmurs, rubs, or gallops. Respiratory: Normal respiratory effort without tachypnea nor retractions. Breath sounds are clear and equal bilaterally. No wheezes/rales/rhonchi. Gastrointestinal: Mild mid to upper abdominal tenderness, no rebound or guarding. Normal bowel sounds. Musculoskeletal: Nontender with normal range of motion in extremities. No lower extremity tenderness nor edema. Neurologic:  Normal speech and language. No gross focal neurologic deficits are appreciated.  Skin:  Skin is warm, dry and intact. No rash noted. Psychiatric: Mood and affect are normal. Speech and behavior are normal.  ____________________________________________  ED COURSE:  Pertinent labs & imaging results that were available during my care of the patient were reviewed by  me and considered in my medical decision making (see chart for details). Patient presents for abdominal pain, vomiting and diarrhea, we will assess with labs and imaging as indicated.   Procedures ____________________________________________   LABS (pertinent positives/negatives)  Labs Reviewed  COMPREHENSIVE METABOLIC PANEL - Abnormal; Notable for the following:       Result Value   Potassium 3.2 (*)    CO2 19 (*)    Glucose, Bld 165 (*)     AST 49 (*)    Total Bilirubin 1.4 (*)    All other components within normal limits  CBC - Abnormal; Notable for the following:    WBC 18.0 (*)    All other components within normal limits  URINALYSIS, COMPLETE (UACMP) WITH MICROSCOPIC - Abnormal; Notable for the following:    Color, Urine YELLOW (*)    APPearance CLOUDY (*)    pH 9.0 (*)    Ketones, ur 20 (*)    Protein, ur 30 (*)    Bacteria, UA FEW (*)    Squamous Epithelial / LPF 0-5 (*)    All other components within normal limits  URINE DRUG SCREEN, QUALITATIVE ([facility] ONLY) - Abnormal; Notable for the following:    Opiate, Ur Screen POSITIVE (*)    Cannabinoid 50 Ng, Ur [place] POSITIVE (*)    Benzodiazepine, Ur Scrn POSITIVE (*)    All other components within normal limits  LIPASE, BLOOD  ETHANOL  POC URINE PREG, ED  POCT PREGNANCY, URINE    RADIOLOGY Images were viewed by me  CT of abdomen and pelvis with contrast IMPRESSION: 1. A cause for the patient's current symptoms is not identified. ____________________________________________  FINAL ASSESSMENT AND PLAN  Abdominal pain, Vomiting   Plan: Patient's labs and imaging were dictated above. Patient had presented for abdominal pain as well as nausea and vomiting. Unclear etiology for her symptoms, this could be THC related And she was advised of same. Currently she is still having vomiting of uncertain etiology. Her exam is benign. I will discuss with the hospitalist for admission.   Emily Filbert, MD   Note: This note was generated in part or whole with voice recognition software. Voice recognition is usually quite accurate but there are transcription errors that can and very often do occur. I apologize for any typographical errors that were not detected and corrected.     Emily Filbert, MD 03/20/17 1031    Emily Filbert, MD 03/20/17 [phone]

## 2017-03-20 NOTE — ED Notes (Signed)
Pt given phone to call husband.   Pt informed that bed is assigned but the last step is it becoming ready. Pt verbalized understanding.

## 2017-03-20 NOTE — H&P (Addendum)
[facility] [place] at [facility]   PATIENT NAME: Tricia Kerr    MR#:  [id number]  DATE OF BIRTH:  May 25, 1986  DATE OF ADMISSION:  03/20/2017  PRIMARY CARE PHYSICIAN: No PCP Per Patient   REQUESTING/REFERRING PHYSICIAN: Dr. Mayford Knife  CHIEF COMPLAINT:  Intractable nausea and vomiting since yesterday  HISTORY OF PRESENT ILLNESS:  Tricia Kerr  is a 31 y.o. female with a known history of Severe marijuana abuse and dependence, history of anxiety and history of schizophreniform disorder (per psych eval in 2016) not on any medication follows with our [facility] comes to the emergency room with intractable nausea and vomiting since yesterday. Patient has been using marijuana pretty much on a daily basis. Denies any other IV drug use or cocaine. Urine drug screen is positive for marijuana. Positive for opioids and Ativan(IV doses given in the emergency room) Patient still continues to vomit despite Phenergan and IV fluids along with IV Zofran. Patient is being admitted for intractable nausea vomiting suspected due to marijuana abuse. CT abdomen is negative  PAST MEDICAL HISTORY:   Past Medical History:  Diagnosis Date  . Anxiety   . Bipolar disorder (HCC)     PAST SURGICAL HISTOIRY:   Past Surgical History:  Procedure Laterality Date  . APPENDECTOMY    . TUBAL LIGATION    . tubial      SOCIAL HISTORY:   Social History  Substance Use Topics  . Smoking status: Current Every Day Smoker    Packs/day: 1.00    Types: Cigarettes  . Smokeless tobacco: Never Used  . Alcohol use Yes    FAMILY HISTORY:  No family history on file.  DRUG ALLERGIES:   Allergies  Allergen Reactions  . Nicotine Anaphylaxis    Nicotine patches     REVIEW OF SYSTEMS:  Review of Systems  Constitutional: Negative for chills, fever and weight loss.  HENT: Negative for ear discharge, ear pain and nosebleeds.   Eyes: Negative for blurred vision, pain and discharge.   Respiratory: Negative for sputum production, shortness of breath, wheezing and stridor.   Cardiovascular: Negative for chest pain, palpitations, orthopnea and PND.  Gastrointestinal: Positive for nausea and vomiting. Negative for abdominal pain and diarrhea.  Genitourinary: Negative for frequency and urgency.  Musculoskeletal: Negative for back pain and joint pain.  Neurological: Positive for weakness. Negative for sensory change, speech change and focal weakness.  Psychiatric/Behavioral: Negative for depression and hallucinations. The patient is not nervous/anxious.      MEDICATIONS AT HOME:   Prior to Admission medications   Medication Sig Start Date End Date Taking? Authorizing Provider  dicyclomine (BENTYL) 20 MG tablet Take 1 tablet (20 mg total) by mouth 3 (three) times daily as needed for spasms. 03/20/17   Emily Filbert, MD  diphenhydrAMINE (BENADRYL) 25 mg capsule Take 1 capsule (25 mg total) by mouth at bedtime. 09/08/15   Jimmy Footman, MD  metoprolol tartrate (LOPRESSOR) 25 MG tablet Take 0.5 tablets (12.5 mg total) by mouth 2 (two) times daily. 09/08/15   Jimmy Footman, MD  promethazine (PHENERGAN) 25 MG tablet Take 1 tablet (25 mg total) by mouth every 4 (four) hours as needed for nausea or vomiting. 03/20/17   Emily Filbert, MD  risperiDONE (RISPERDAL) 3 MG tablet Take 1 tablet (3 mg total) by mouth 2 (two) times daily. 09/08/15   Jimmy Footman, MD      VITAL SIGNS:  Blood pressure (!) 128/50, pulse (!) 58, temperature 97.7 F (36.5  C), temperature source Oral, resp. rate 20, height  (1.499 m), weight 65.8 kg (145 lb), last menstrual period 02/27/2017, SpO2 96 %.  PHYSICAL EXAMINATION:  GENERAL:  31 y.o.-year-old patient lying in the bed with no acute distress.  EYES: Pupils equal, round, reactive to light and accommodation. No scleral icterus. Extraocular muscles intact.  HEENT: Head atraumatic, normocephalic. Oropharynx  and nasopharynx clear.  NECK:  Supple, no jugular venous distention. No thyroid enlargement, no tenderness.  LUNGS: Normal breath sounds bilaterally, no wheezing, rales,rhonchi or crepitation. No use of accessory muscles of respiration.  CARDIOVASCULAR: S1, S2 normal. No murmurs, rubs, or gallops.  ABDOMEN: Soft, nontender, nondistended. Bowel sounds present. No organomegaly or mass.  EXTREMITIES: No pedal edema, cyanosis, or clubbing.  NEUROLOGIC: Cranial nerves II through XII are intact. Muscle strength 5/5 in all extremities. Sensation intact. Gait not checked.  PSYCHIATRIC: The patient is alert and oriented x 3.  SKIN: No obvious rash, lesion, or ulcer.   LABORATORY PANEL:   CBC  Recent Labs Lab 03/20/17 0650  WBC 18.0*  HGB 15.2  HCT 44.8  PLT 355   ------------------------------------------------------------------------------------------------------------------  Chemistries   Recent Labs Lab 03/20/17 0650  NA 138  K 3.2*  CL 105  CO2 19*  GLUCOSE 165*  BUN 7  CREATININE 0.63  CALCIUM 9.6  AST 49*  ALT 38  ALKPHOS 102  BILITOT 1.4*   ------------------------------------------------------------------------------------------------------------------  Cardiac Enzymes No results for input(s): TROPONINI in the last 168 hours. ------------------------------------------------------------------------------------------------------------------  RADIOLOGY:  Ct Abdomen Pelvis W Contrast  Result Date: 03/20/2017 CLINICAL DATA:  Lower abdominal pain starting last night with nausea and vomiting. EXAM: CT ABDOMEN AND PELVIS WITH CONTRAST TECHNIQUE: Multidetector CT imaging of the abdomen and pelvis was performed using the standard protocol following bolus administration of intravenous contrast. CONTRAST:  ISOVUE-300 IOPAMIDOL (ISOVUE-300) INJECTION 61% COMPARISON:  08/25/2015 FINDINGS: Lower chest: Unremarkable Hepatobiliary: Contracted gallbladder. Pancreas: Unremarkable  Spleen: Unremarkable Adrenals/Urinary Tract: Unremarkable Stomach/Bowel: Unremarkable.  Appendix surgically absent. Vascular/Lymphatic: Unremarkable Reproductive: Unremarkable.  Endometrial thickness 9 mm. Other: No supplemental non-categorized findings. Musculoskeletal: Unremarkable IMPRESSION: 1. A cause for the patient's current symptoms is not identified. Electronically Signed   By: Gaylyn Rong M.D.   On: 03/20/2017 10:22    EKG:    IMPRESSION AND PLAN:  Anamarie Hunn  is a 31 y.o. female with a known history of Severe marijuana abuse and dependence, history of anxiety and history of schizophreniform disorder (per psych eval in 2016) not on any medication follows with our [facility] comes to the emergency room with intractable nausea and vomiting since yesterday. Patient has been using marijuana pretty much on a daily basis   1. Intractable nausea and vomiting suspect due to marijuana abuse -Workup in the ED was CT scan negative -Patient uses marijuana on a daily basis she has history of severe dependence -Nothing by mouth except meds and ice chips -IV fluids -Patient advise abstinence  2. Tobacco abuse counseled smoking cessation more than 4 minutes spent  3. Hypokalemia secondary to GI losses replete with IV fluids  4. DVT prophylaxis subcutaneous Lovenox  All the records are reviewed and case discussed with ED provider. Management plans discussed with the patient, family and they are in agreement.  CODE STATUS: Full TOTAL TIME TAKING CARE OF THIS PATIENT: 45 minutes.    Danayah Smyre M.D on 03/20/2017 at 11:10 AM  Between 7am to 6pm - Pager - [phone]  After 6pm go to [email or web address] - password [email or web address] [facility]  [facility]  Office  [phone]  CC: Primary care physician; No PCP Per Patient

## 2017-03-20 NOTE — ED Notes (Signed)
Pt bp elevated, MD notified.

## 2017-03-20 NOTE — ED Triage Notes (Addendum)
Pt to triage via w/c, appears uncomfortable, restless; brought in by EMS; pt c/o lower abd pain since last night accomp by N/V

## 2017-03-21 LAB — CBC
HCT: 41.9 % (ref 35.0–47.0)
Hemoglobin: 14.6 g/dL (ref 12.0–16.0)
MCH: 31 pg (ref 26.0–34.0)
MCHC: 34.8 g/dL (ref 32.0–36.0)
MCV: 89.3 fL (ref 80.0–100.0)
PLATELETS: 287 10*3/uL (ref 150–440)
RBC: 4.7 MIL/uL (ref 3.80–5.20)
RDW: 13.5 % (ref 11.5–14.5)
WBC: 16.1 10*3/uL — AB (ref 3.6–11.0)

## 2017-03-21 NOTE — Discharge Summary (Addendum)
[facility] - [place] at [facility]   PATIENT NAME: Tricia Kerr    MR#:  [id number]  DATE OF BIRTH:  1986/04/13  DATE OF ADMISSION:  03/20/2017 ADMITTING PHYSICIAN: Enedina Finner, MD  DATE OF DISCHARGE: 03/21/2017  PRIMARY CARE PHYSICIAN: No PCP Per Patient    ADMISSION DIAGNOSIS:  Pain of upper abdomen [R10.10] Non-intractable cyclical vomiting, presence of nausea not specified [G43.A0]  DISCHARGE DIAGNOSIS:  Active Problems:   Intractable nausea and vomiting   SECONDARY DIAGNOSIS:   Past Medical History:  Diagnosis Date  . Anxiety   . Bipolar disorder [facility])     HOSPITAL COURSE:   31 year old female with a history of marijuana abuse and schizophreniform disorder who presents to intractable nausea and vomiting.  1. Intractable nausea and vomiting: Patient symptoms have resolved. This may be related to marijuana use versus gastroenteritis .  CT scan of the abdomen showed no acute pathology.  2. Marijuana abuse: Patient advised to stop  3. Tobacco dependence: Patient is encouraged to quit smoking. Counseling was provided for 4 minutes. 4. Hypokalemia: This was repleted  5. Leukocytosis: This is from nausea and vomiting which is improved  DISCHARGE CONDITIONS AND DIET:  Stable Regular diet  CONSULTS OBTAINED:    DRUG ALLERGIES:   Allergies  Allergen Reactions  . Nicotine Anaphylaxis    Nicotine patches     DISCHARGE MEDICATIONS:   Current Discharge Medication List    STOP taking these medications     diphenhydrAMINE (BENADRYL) 25 mg capsule           Today   CHIEF COMPLAINT:  doing well this am    VITAL SIGNS:  Blood pressure 136/77, pulse (!) 59, temperature 98.3 F (36.8 C), temperature source Oral, resp. rate 16, height  (1.499 m), weight 65.8 kg (145 lb), last menstrual period 02/27/2017, SpO2 99 %.   REVIEW OF SYSTEMS:  Review of Systems  Constitutional: Negative.  Negative for chills, fever and  malaise/fatigue.  HENT: Negative.  Negative for ear discharge, ear pain, hearing loss, nosebleeds and sore throat.   Eyes: Negative.  Negative for blurred vision and pain.  Respiratory: Negative.  Negative for cough, hemoptysis, shortness of breath and wheezing.   Cardiovascular: Negative.  Negative for chest pain, palpitations and leg swelling.  Gastrointestinal: Negative.  Negative for abdominal pain, blood in stool, diarrhea, nausea and vomiting.  Genitourinary: Negative.  Negative for dysuria.  Musculoskeletal: Negative.  Negative for back pain.  Skin: Negative.   Neurological: Negative for dizziness, tremors, speech change, focal weakness, seizures and headaches.  Endo/Heme/Allergies: Negative.  Does not bruise/bleed easily.  Psychiatric/Behavioral: Negative.  Negative for depression, hallucinations and suicidal ideas.     PHYSICAL EXAMINATION:  GENERAL:  31 y.o.-year-old patient lying in the bed with no acute distress.  NECK:  Supple, no jugular venous distention. No thyroid enlargement, no tenderness.  LUNGS: Normal breath sounds bilaterally, no wheezing, rales,rhonchi  No use of accessory muscles of respiration.  CARDIOVASCULAR: S1, S2 normal. No murmurs, rubs, or gallops.  ABDOMEN: Soft, non-tender, non-distended. Bowel sounds present. No organomegaly or mass.  EXTREMITIES: No pedal edema, cyanosis, or clubbing.  PSYCHIATRIC: The patient is alert and oriented x 3.  SKIN: No obvious rash, lesion, or ulcer.   DATA REVIEW:   CBC  Recent Labs Lab 03/21/17 0657  WBC 16.1*  HGB 14.6  HCT 41.9  PLT 287    Chemistries   Recent Labs Lab 03/20/17 0650  NA 138  K 3.2*  CL 105  CO2 19*  GLUCOSE 165*  BUN 7  CREATININE 0.63  CALCIUM 9.6  AST 49*  ALT 38  ALKPHOS 102  BILITOT 1.4*    Cardiac Enzymes No results for input(s): TROPONINI in the last 168 hours.  Microbiology Results  @  RADIOLOGY:  Ct Abdomen Pelvis W Contrast  Result Date:  03/20/2017 CLINICAL DATA:  Lower abdominal pain starting last night with nausea and vomiting. EXAM: CT ABDOMEN AND PELVIS WITH CONTRAST TECHNIQUE: Multidetector CT imaging of the abdomen and pelvis was performed using the standard protocol following bolus administration of intravenous contrast. CONTRAST:  ISOVUE-300 IOPAMIDOL (ISOVUE-300) INJECTION 61% COMPARISON:  08/25/2015 FINDINGS: Lower chest: Unremarkable Hepatobiliary: Contracted gallbladder. Pancreas: Unremarkable Spleen: Unremarkable Adrenals/Urinary Tract: Unremarkable Stomach/Bowel: Unremarkable.  Appendix surgically absent. Vascular/Lymphatic: Unremarkable Reproductive: Unremarkable.  Endometrial thickness 9 mm. Other: No supplemental non-categorized findings. Musculoskeletal: Unremarkable IMPRESSION: 1. A cause for the patient's current symptoms is not identified. Electronically Signed   By: Gaylyn Rong M.D.   On: 03/20/2017 10:22      Current Discharge Medication List    STOP taking these medications     diphenhydrAMINE (BENADRYL) 25 mg capsule            Management plans discussed with the patient and she is in agreement. Stable for discharge home  Patient should follow up with needs PCP to follow up with   CODE STATUS:     Code Status Orders        Start     Ordered   03/20/17 1535  Full code  Continuous     03/20/17 1534    Code Status History    Date Active Date Inactive Code Status Order ID Comments User Context   08/23/2015  3:22 PM 09/09/2015  2:45 PM Full Code [id number]  Beau Fanny, MD Inpatient      TOTAL TIME TAKING CARE OF THIS PATIENT: 37 minutes.    Note: This dictation was prepared with Dragon dictation along with smaller phrase technology. Any transcriptional errors that result from this process are unintentional.  Richard Holz M.D on 03/21/2017 at 12:54 PM  Between 7am to 6pm - Pager - [phone] After 6pm go to [email or web address] - Social research officer, government  [facility]   Office  [phone]  CC: Primary care physician; No PCP Per Patient

## 2017-03-21 NOTE — Progress Notes (Signed)
Pt is d/ced home.  She was admitted for intractable N&V possibly r/t marijuana use.  She's had no vomiting since admitted to the floor yesterday afternoon.  She did have some nausea earlier today but she believes it's from not eating.  She was started on a clear liquid diet which she tolerated and then advanced to soft.  She's been less lethargic today than yesterday.  Has some abdominal pain possibly from straining during vomiting.  IV will be removed and d/c instructions reviewed before pt goes home with husband.  Pt counseled to stop smoking marijuana.

## 2017-05-16 ENCOUNTER — Emergency Department
Admission: EM | Admit: 2017-05-16 | Discharge: 2017-05-16 | Disposition: A | Discharge status: Home / Self Care | Payer: Medicaid Other | Attending: Emergency Medicine | Admitting: Emergency Medicine

## 2017-05-16 ENCOUNTER — Encounter: Payer: Self-pay | Admitting: Medical Oncology

## 2017-05-16 DIAGNOSIS — N3001 Acute cystitis with hematuria: Secondary | ICD-10-CM

## 2017-05-16 DIAGNOSIS — F17210 Nicotine dependence, cigarettes, uncomplicated: Secondary | ICD-10-CM | POA: Insufficient documentation

## 2017-05-16 DIAGNOSIS — F1220 Cannabis dependence, uncomplicated: Secondary | ICD-10-CM | POA: Insufficient documentation

## 2017-05-16 DIAGNOSIS — N3091 Cystitis, unspecified with hematuria: Secondary | ICD-10-CM | POA: Insufficient documentation

## 2017-05-16 LAB — URINALYSIS, COMPLETE (UACMP) WITH MICROSCOPIC
Bacteria, UA: NONE SEEN
Bilirubin Urine: NEGATIVE
GLUCOSE, UA: NEGATIVE mg/dL
Ketones, ur: NEGATIVE mg/dL
Leukocytes, UA: NEGATIVE
Nitrite: NEGATIVE
Protein, ur: NEGATIVE mg/dL
SPECIFIC GRAVITY, URINE: 1.015 (ref 1.005–1.030)
pH: 5 (ref 5.0–8.0)

## 2017-05-16 LAB — POCT PREGNANCY, URINE: Preg Test, Ur: NEGATIVE

## 2017-05-16 MED ORDER — PHENAZOPYRIDINE HCL 100 MG PO TABS: 100.0000 mg | ORAL_TABLET | Freq: Three times a day (TID) | ORAL | 0 refills | Status: AC | PRN

## 2017-05-16 MED ORDER — CEPHALEXIN 500 MG PO CAPS
500.0000 mg | ORAL_CAPSULE | Freq: Three times a day (TID) | ORAL | 0 refills | Status: DC
Start: 2017-05-16 — End: 2019-01-19

## 2017-05-16 NOTE — ED Triage Notes (Signed)
Pt reports for 2 days she has been having pressure and burning with urination.

## 2017-05-16 NOTE — ED Notes (Signed)
Burning with urination. Pain left flank with palpation. Pain 5/10

## 2017-05-16 NOTE — Discharge Instructions (Signed)
[facility] if any continued problems  Increase fluids. Keflex 500 mg 3 times a day for 10 days. Pyridium 3 times a day after meals for urinary discomfort. Remembered that this medication will cause discoloration of urine. You may also take Tylenol if needed for pain as needed.

## 2017-05-16 NOTE — ED Provider Notes (Signed)
[facility] Emergency Department Provider Note   ____________________________________________   First MD Initiated Contact with Patient 05/16/17 [phone]     (approximate)  I have reviewed the triage vital signs and the nursing notes.   HISTORY  Chief Complaint Dysuria    HPI Tricia Kerr is a 31 y.o. female is here complaining of dysuria for the last 2 days. Patient denies any previous urinary tract infections or history of kidney stones. She denies any nausea, vomiting or fever or chills. She denies any back pain. She states she mostly is uncomfortable over her bladder.She rates her pain as a 5/10.   Past Medical History:  Diagnosis Date  . Anxiety   . Bipolar disorder [facility](HCC)     Patient Active Problem List   Diagnosis Date Noted  . Intractable nausea and vomiting 03/20/2017  . 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 Tobacco use disorder 08/23/2015  . Cannabis use disorder, severe, dependence (HCC) 08/23/2015    Past Surgical History:  Procedure Laterality Date  . APPENDECTOMY    . TUBAL LIGATION    . tubial      Prior to Admission medications   Medication Sig Start Date End Date Taking? Authorizing Provider  cephALEXin (KEFLEX) 500 MG capsule Take 1 capsule (500 mg total) by mouth 3 (three) times daily. 05/16/17   Tommi RumpsSummers, Pedrohenrique Mcconville L, PA-C  phenazopyridine (PYRIDIUM) 100 MG tablet Take 1 tablet (100 mg total) by mouth 3 (three) times daily as needed for pain. 05/16/17 05/16/18  Tommi RumpsSummers, Quantavious Eggert L, PA-C    Allergies Nicotine  No family history on file.  Social History Social History  Substance Use Topics  . Smoking status: Current Every Day Smoker    Packs/day: 1.00    Types: Cigarettes  . Smokeless tobacco: Never Used  . Alcohol use Yes    Review of Systems  Constitutional: No fever/chills Cardiovascular: Denies chest pain. Respiratory: Denies shortness of breath. Gastrointestinal: Minimal suprapubic discomfort. No nausea,  no vomiting.  Genitourinary: Positive for dysuria. Negative vaginal discharge. Musculoskeletal: Negative for back pain. Skin: Negative for rash. Neurological: Negative for headaches, focal weakness or numbness.   ____________________________________________   PHYSICAL EXAM:  VITAL SIGNS: ED Triage Vitals  Enc Vitals Group     BP 05/16/17 0917 131/74     Pulse Rate 05/16/17 0917 74     Resp 05/16/17 0917 18     Temp 05/16/17 0917 98.4 F (36.9 C)     Temp Source 05/16/17 0917 Oral     SpO2 05/16/17 0917 97 %     Weight 05/16/17 0916 130 lb (59 kg)     Height 05/16/17 0916 4\' 11"  (1.499 m)     Head Circumference --      Peak Flow --      Pain Score 05/16/17 0916 5     Pain Loc --      Pain Edu? --      Excl. in GC? --     Constitutional: Alert and oriented. Well appearing and in no acute distress. Eyes: Conjunctivae are normal. PERRL. EOMI. Head: Atraumatic. Neck: No stridor.   Cardiovascular: Normal rate, regular rhythm. Grossly normal heart sounds.  Good peripheral circulation. Respiratory: Normal respiratory effort.  No retractions. Lungs CTAB. Gastrointestinal: Soft and nontender. No distention.  No CVA tenderness. Musculoskeletal: No lower extremity tenderness nor edema.   Neurologic:  Normal speech and language. No gross focal neurologic deficits are appreciated. Skin:  Skin is warm, dry and intact. No rash  noted. Psychiatric: Mood and affect are normal. Speech and behavior are normal.  ____________________________________________   LABS (all labs ordered are listed, but only abnormal results are displayed)  Labs Reviewed  URINALYSIS, COMPLETE (UACMP) WITH MICROSCOPIC - Abnormal; Notable for the following:       Result Value   Color, Urine YELLOW (*)    APPearance CLEAR (*)    Hgb urine dipstick SMALL (*)    Squamous Epithelial / LPF 0-5 (*)    All other components within normal limits  POC URINE PREG, ED  POCT PREGNANCY, URINE     PROCEDURES  Procedure(s) performed: None  Procedures  Critical Care performed: No  ____________________________________________   INITIAL IMPRESSION / ASSESSMENT AND PLAN / ED COURSE  Pertinent labs & imaging results that were available during my care of the patient were reviewed by me and considered in my medical decision making (see chart for details).  Patient was placed on Keflex 500 mg 3 times a day for 10 days. She is also given a prescription for Pyridium 100 mg 3 times a day after meals as needed for discomfort and frequency. Patient is to increase fluids. She is to follow-up with [facility] clinic or her PCP if any continued problems.      ____________________________________________   FINAL CLINICAL IMPRESSION(S) / ED DIAGNOSES  Final diagnoses:  Acute cystitis with hematuria      NEW MEDICATIONS STARTED DURING THIS VISIT:  Discharge Medication List as of 05/16/2017 10:40 AM    START taking these medications   Details  cephALEXin (KEFLEX) 500 MG capsule Take 1 capsule (500 mg total) by mouth 3 (three) times daily., Starting Wed 05/16/2017, Print    phenazopyridine (PYRIDIUM) 100 MG tablet Take 1 tablet (100 mg total) by mouth 3 (three) times daily as needed for pain., Starting Wed 05/16/2017, Until Thu 05/16/2018, Print         Note:  This document was prepared using Dragon voice recognition software and may include unintentional dictation errors.    Tommi Rumps, PA-C 05/16/17 1104    Charlynne Pander, MD 05/16/17 [phone]

## 2018-05-08 IMAGING — CT CT ABD-PELV W/ CM
2 of 4 series · 17 of 46 positions shown, 19 images · IV contrast (APPLIED)
Comparison: 08/25/2015

CLINICAL DATA: Lower abdominal pain starting last night with nausea
and vomiting.

EXAM:
CT ABDOMEN AND PELVIS WITH CONTRAST
TECHNIQUE: Multidetector CT imaging of the abdomen and pelvis was performed
using the standard protocol following bolus administration of
intravenous contrast.
CONTRAST:  100mL ERQFCU-6JJ IOPAMIDOL (ERQFCU-6JJ) INJECTION 61%

[Series 2: routine abd/pel with · axial · 0.75mm/px · z∈[-474,-34]mm · 14 of 97 slices shown, 16 images]
[im 5/97  soft-tissue]
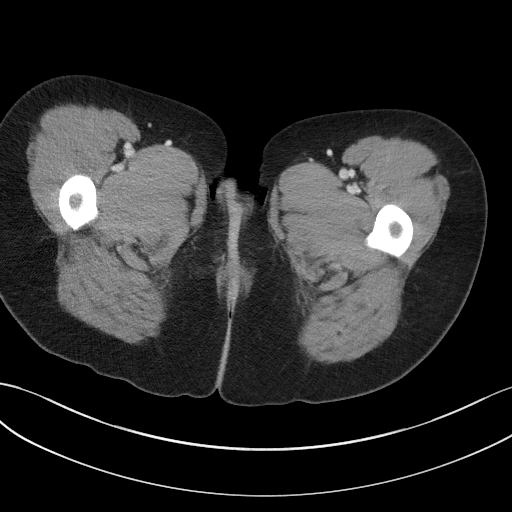
[im 5/97  bone]
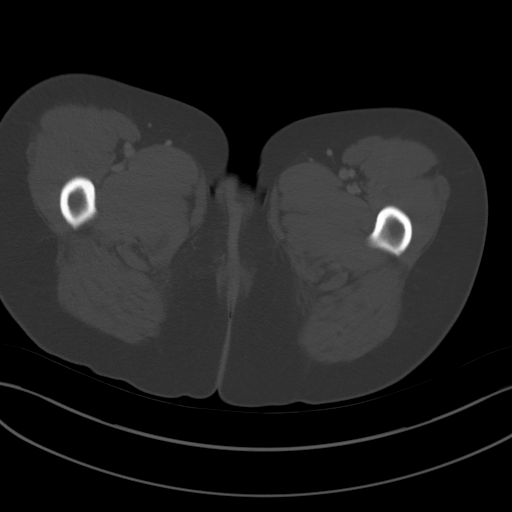
[im 13/97  soft-tissue]
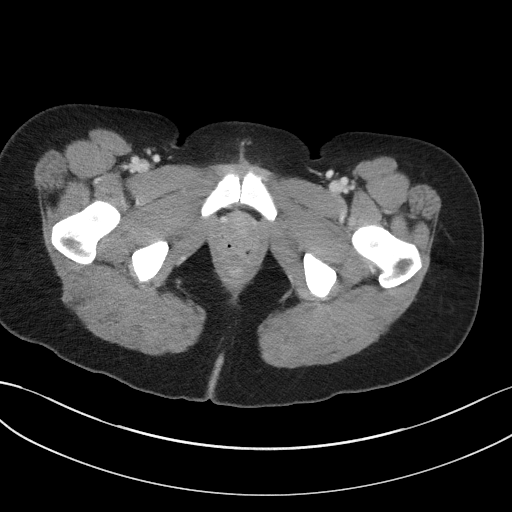
[im 21/97  soft-tissue]
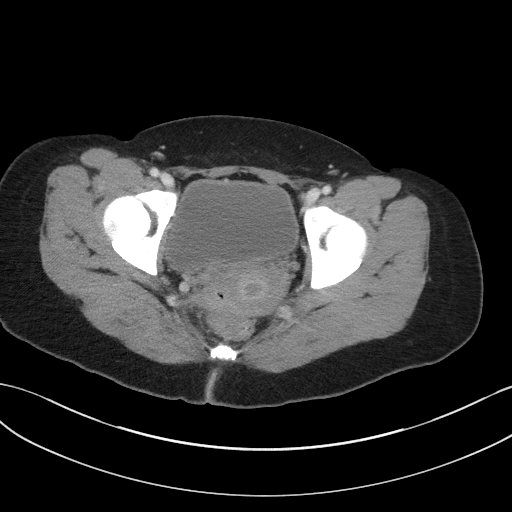
[im 25/97  soft-tissue]
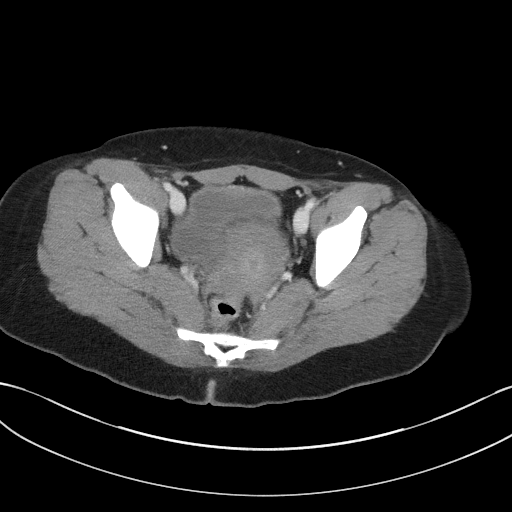
[im 33/97  soft-tissue]
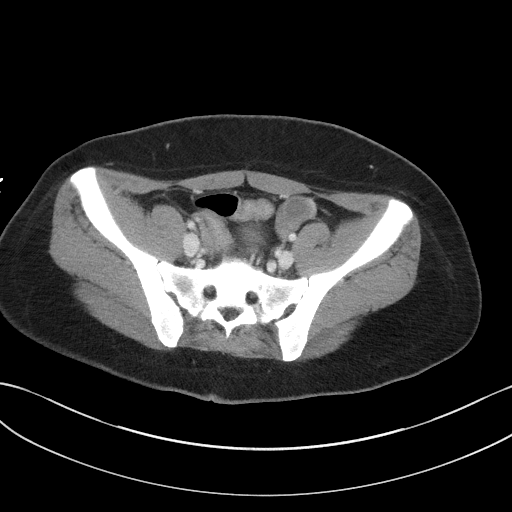
[im 41/97  soft-tissue]
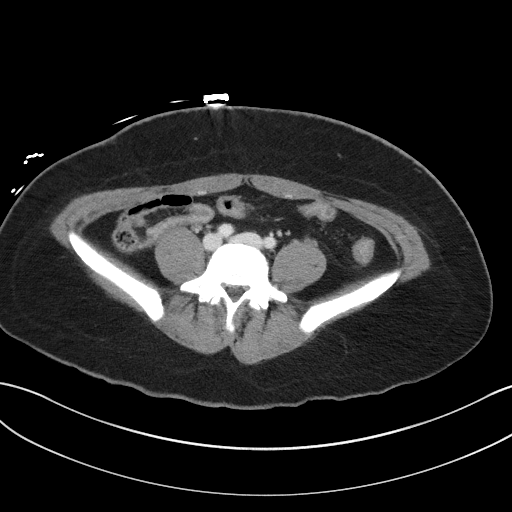
[im 45/97  soft-tissue]
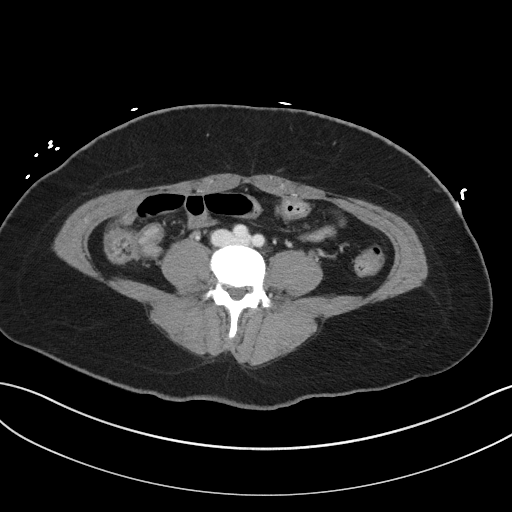
[im 53/97  soft-tissue]
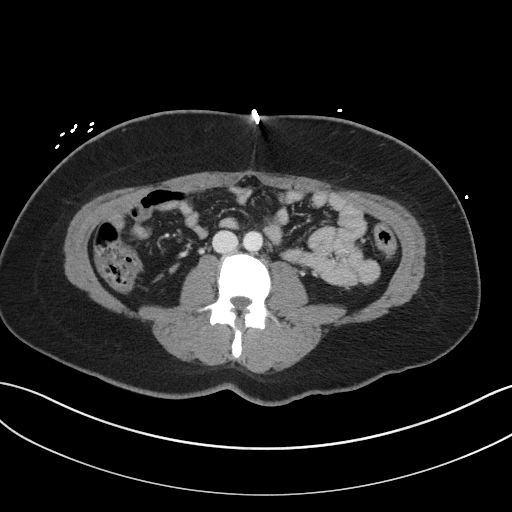
[im 57/97  soft-tissue]
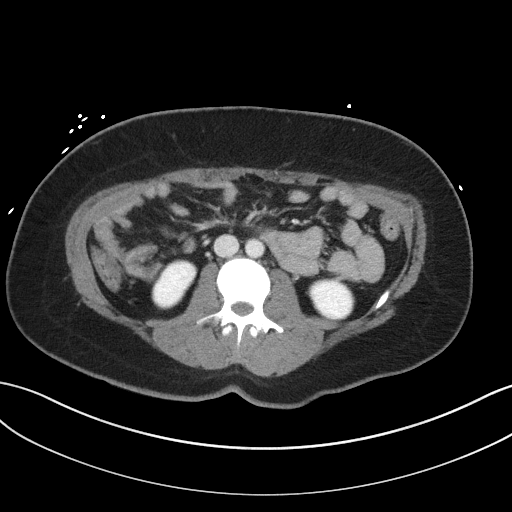
[im 57/97  bone]
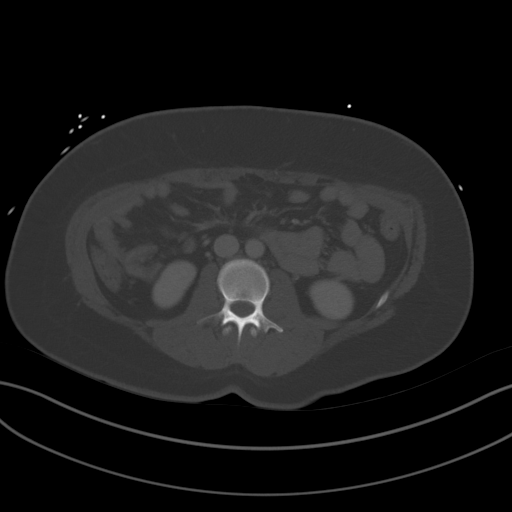
[im 65/97  soft-tissue]
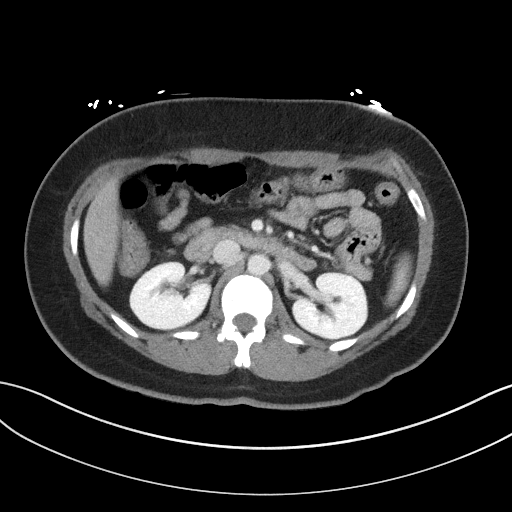
[im 73/97  soft-tissue]
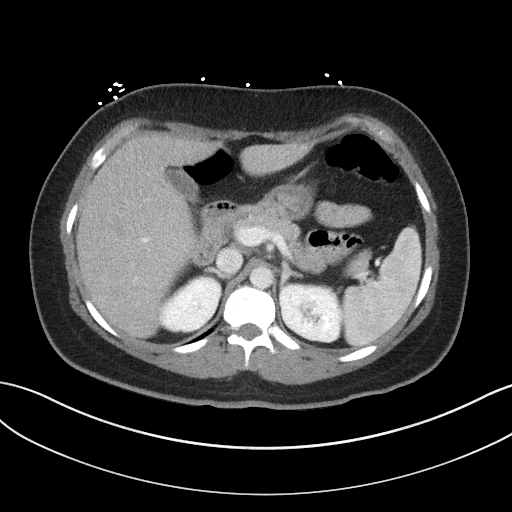
[im 77/97  soft-tissue]
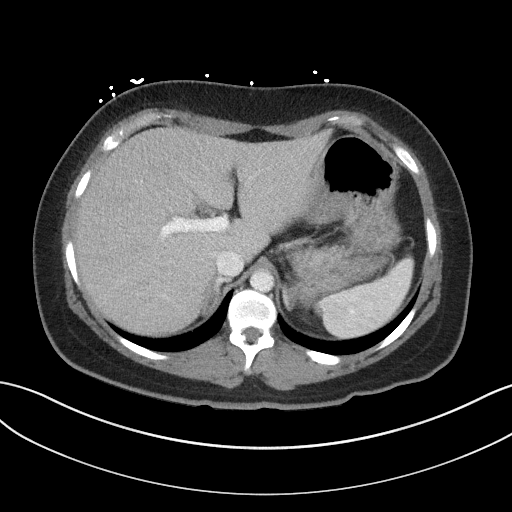
[im 85/97  soft-tissue]
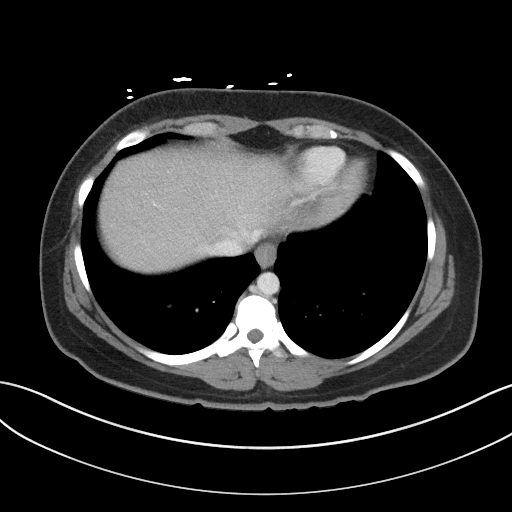
[im 93/97  soft-tissue]
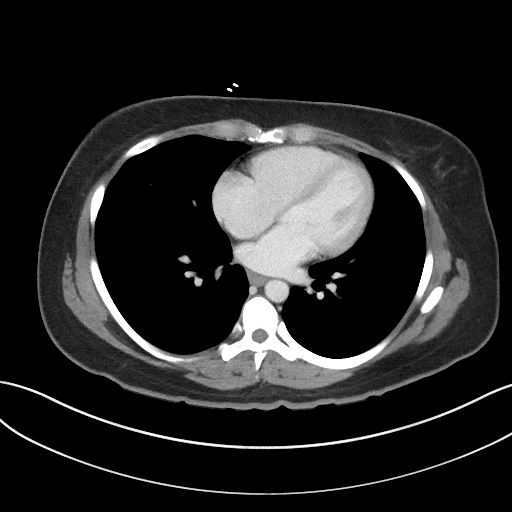

[Series 5: coronal st · coronal · 0.81mm/px · 3 of 77 slices shown]
[im 26/77  soft-tissue]
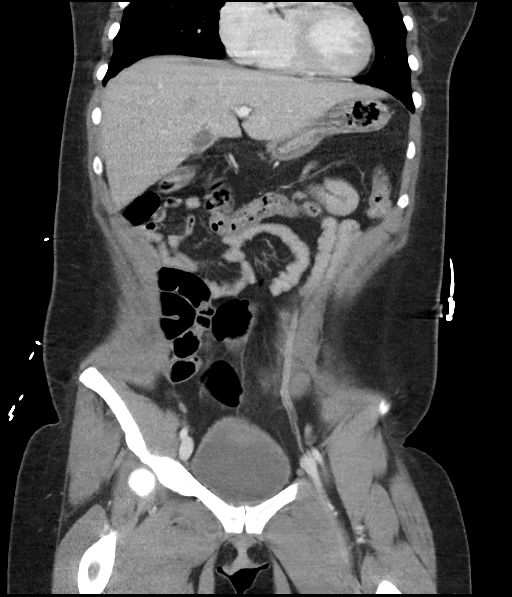
[im 34/77  soft-tissue]
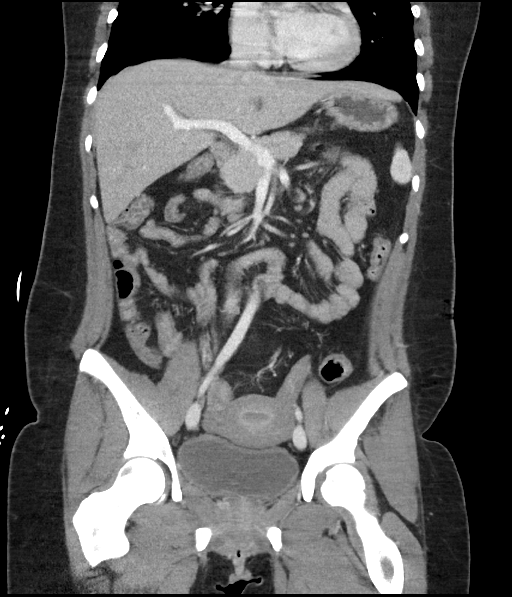
[im 43/77  soft-tissue]
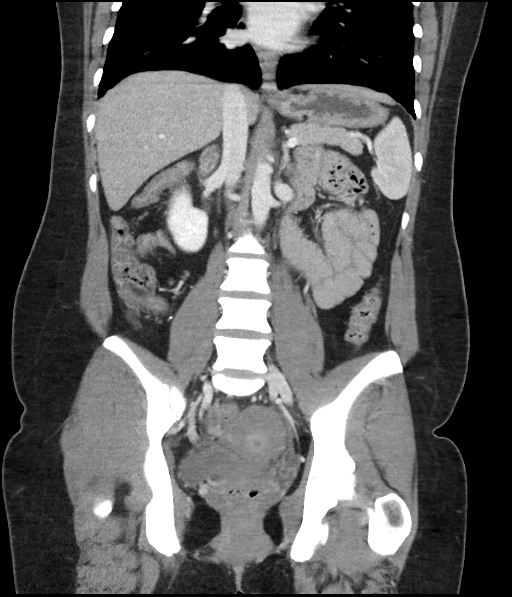

[17 of 46 positions shown; findings below may reference images not displayed]

FINDINGS: Lower chest: Unremarkable

Hepatobiliary: Contracted gallbladder.

Pancreas: Unremarkable

Spleen: Unremarkable

Adrenals/Urinary Tract: Unremarkable

Stomach/Bowel: Unremarkable.  Appendix surgically absent.

Vascular/Lymphatic: Unremarkable

Reproductive: Unremarkable.  Endometrial thickness 9 mm.

Other: No supplemental non-categorized findings.

Musculoskeletal: Unremarkable
IMPRESSION: 1. A cause for the patient's current symptoms is not identified.

## 2019-01-19 ENCOUNTER — Other Ambulatory Visit: Payer: Self-pay

## 2019-01-19 ENCOUNTER — Encounter: Payer: Self-pay | Admitting: Emergency Medicine

## 2019-01-19 ENCOUNTER — Emergency Department: Payer: No Typology Code available for payment source

## 2019-01-19 ENCOUNTER — Emergency Department
Admission: EM | Admit: 2019-01-19 | Discharge: 2019-01-19 | Disposition: A | Discharge status: Home / Self Care | Payer: No Typology Code available for payment source | Attending: Emergency Medicine | Admitting: Emergency Medicine

## 2019-01-19 DIAGNOSIS — Y9389 Activity, other specified: Secondary | ICD-10-CM | POA: Insufficient documentation

## 2019-01-19 DIAGNOSIS — Y999 Unspecified external cause status: Secondary | ICD-10-CM | POA: Diagnosis not present

## 2019-01-19 DIAGNOSIS — F17210 Nicotine dependence, cigarettes, uncomplicated: Secondary | ICD-10-CM | POA: Insufficient documentation

## 2019-01-19 DIAGNOSIS — M25571 Pain in right ankle and joints of right foot: Secondary | ICD-10-CM

## 2019-01-19 DIAGNOSIS — Y92410 Unspecified street and highway as the place of occurrence of the external cause: Secondary | ICD-10-CM | POA: Insufficient documentation

## 2019-01-19 MED ORDER — CYCLOBENZAPRINE HCL 5 MG PO TABS: ORAL_TABLET | ORAL | 0 refills | Status: DC

## 2019-01-19 MED ORDER — IBUPROFEN 200 MG PO TABS: 400.0000 mg | ORAL_TABLET | Freq: Four times a day (QID) | ORAL | 0 refills | Status: DC | PRN
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
## 2021-05-31 ENCOUNTER — Encounter: Payer: Self-pay | Admitting: Emergency Medicine

## 2021-05-31 ENCOUNTER — Other Ambulatory Visit: Payer: Self-pay

## 2021-05-31 ENCOUNTER — Emergency Department
Admission: EM | Admit: 2021-05-31 | Discharge: 2021-06-02 | Disposition: A | Discharge status: Home / Self Care | Payer: 59 | Attending: Emergency Medicine | Admitting: Emergency Medicine

## 2021-05-31 DIAGNOSIS — F17210 Nicotine dependence, cigarettes, uncomplicated: Secondary | ICD-10-CM | POA: Insufficient documentation

## 2021-05-31 DIAGNOSIS — F1290 Cannabis use, unspecified, uncomplicated: Secondary | ICD-10-CM | POA: Insufficient documentation

## 2021-05-31 DIAGNOSIS — K8020 Calculus of gallbladder without cholecystitis without obstruction: Secondary | ICD-10-CM | POA: Insufficient documentation

## 2021-05-31 DIAGNOSIS — Z20822 Contact with and (suspected) exposure to covid-19: Secondary | ICD-10-CM | POA: Insufficient documentation

## 2021-05-31 DIAGNOSIS — F2081 Schizophreniform disorder: Secondary | ICD-10-CM | POA: Insufficient documentation

## 2021-05-31 DIAGNOSIS — F22 Delusional disorders: Secondary | ICD-10-CM

## 2021-05-31 DIAGNOSIS — Y900 Blood alcohol level of less than 20 mg/100 ml: Secondary | ICD-10-CM | POA: Insufficient documentation

## 2021-05-31 DIAGNOSIS — F3112 Bipolar disorder, current episode manic without psychotic features, moderate: Secondary | ICD-10-CM | POA: Insufficient documentation

## 2021-05-31 DIAGNOSIS — F302 Manic episode, severe with psychotic symptoms: Secondary | ICD-10-CM

## 2021-05-31 DIAGNOSIS — Z046 Encounter for general psychiatric examination, requested by authority: Secondary | ICD-10-CM | POA: Insufficient documentation

## 2021-05-31 LAB — COMPREHENSIVE METABOLIC PANEL
ALT: 28 U/L (ref 0–44)
AST: 27 U/L (ref 15–41)
Albumin: 4.9 g/dL (ref 3.5–5.0)
Alkaline Phosphatase: 107 U/L (ref 38–126)
Anion gap: 7 (ref 5–15)
BUN: 12 mg/dL (ref 6–20)
CO2: 28 mmol/L (ref 22–32)
Calcium: 10 mg/dL (ref 8.9–10.3)
Chloride: 104 mmol/L (ref 98–111)
Creatinine, Ser: 0.71 mg/dL (ref 0.44–1.00)
GFR, Estimated: >60 mL/min (ref >60)
Glucose, Bld: 122 mg/dL — ABNORMAL HIGH (ref 70–99)
Potassium: 3.9 mmol/L (ref 3.5–5.1)
Sodium: 139 mmol/L (ref 135–145)
Total Bilirubin: 1.1 mg/dL (ref 0.3–1.2)
Total Protein: 9 g/dL — ABNORMAL HIGH (ref 6.5–8.1)

## 2021-05-31 LAB — CBC
HCT: 46.1 % — ABNORMAL HIGH (ref 36.0–46.0)
Hemoglobin: 15.9 g/dL — ABNORMAL HIGH (ref 12.0–15.0)
MCH: 29.2 pg (ref 26.0–34.0)
MCHC: 34.5 g/dL (ref 30.0–36.0)
MCV: 84.6 fL (ref 80.0–100.0)
Platelets: 395 10*3/uL (ref 150–400)
RBC: 5.45 MIL/uL — ABNORMAL HIGH (ref 3.87–5.11)
RDW: 13.5 % (ref 11.5–15.5)
WBC: 18.9 10*3/uL — ABNORMAL HIGH (ref 4.0–10.5)
nRBC: 0 % (ref 0.0–0.2)

## 2021-05-31 LAB — URINALYSIS, COMPLETE (UACMP) WITH MICROSCOPIC
Bacteria, UA: NONE SEEN
Bilirubin Urine: NEGATIVE
Glucose, UA: NEGATIVE mg/dL
Hgb urine dipstick: NEGATIVE
Ketones, ur: 5 mg/dL — AB
Leukocytes,Ua: NEGATIVE
Nitrite: NEGATIVE
Protein, ur: 30 mg/dL — AB
Specific Gravity, Urine: 1.028 (ref 1.005–1.030)
pH: 5 (ref 5.0–8.0)

## 2021-05-31 LAB — RESP PANEL BY RT-PCR (FLU A&B, COVID) ARPGX2
Influenza A by PCR: NEGATIVE
Influenza B by PCR: NEGATIVE
SARS Coronavirus 2 by RT PCR: NEGATIVE

## 2021-05-31 LAB — URINE DRUG SCREEN, QUALITATIVE (ARMC ONLY)
Amphetamines, Ur Screen: NOT DETECTED
Barbiturates, Ur Screen: NOT DETECTED
Benzodiazepine, Ur Scrn: NOT DETECTED
Cannabinoid 50 Ng, Ur ~~LOC~~: POSITIVE — AB
Cocaine Metabolite,Ur ~~LOC~~: NOT DETECTED
MDMA (Ecstasy)Ur Screen: NOT DETECTED
Methadone Scn, Ur: NOT DETECTED
Opiate, Ur Screen: NOT DETECTED
Phencyclidine (PCP) Ur S: NOT DETECTED
Tricyclic, Ur Screen: NOT DETECTED

## 2021-05-31 LAB — ACETAMINOPHEN LEVEL: Acetaminophen (Tylenol), Serum: <10 ug/mL — ABNORMAL LOW (ref 10–30)

## 2021-05-31 LAB — ETHANOL: Alcohol, Ethyl (B): <10 mg/dL (ref <10)

## 2021-05-31 LAB — SALICYLATE LEVEL: Salicylate Lvl: <7 mg/dL — ABNORMAL LOW (ref 7.0–30.0)

## 2021-05-31 LAB — POC URINE PREG, ED: Preg Test, Ur: NEGATIVE

## 2021-05-31 MED ORDER — DIPHENHYDRAMINE HCL 50 MG/ML IJ SOLN
50.0000 mg | Freq: Once | INTRAMUSCULAR | Status: AC
  Administered 2021-05-31: 50 mg via INTRAMUSCULAR
  Filled 2021-05-31: qty 1

## 2021-05-31 MED ORDER — LORAZEPAM 2 MG/ML IJ SOLN
2.0000 mg | Freq: Once | INTRAMUSCULAR | Status: AC
  Administered 2021-05-31: 2 mg via INTRAMUSCULAR
  Filled 2021-05-31: qty 1

## 2021-05-31 MED ORDER — HALOPERIDOL LACTATE 5 MG/ML IJ SOLN
5.0000 mg | Freq: Once | INTRAMUSCULAR | Status: AC
  Administered 2021-05-31: 5 mg via INTRAMUSCULAR
  Filled 2021-05-31: qty 1
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
## 2021-05-31 NOTE — ED Notes (Signed)
Restraints never initiated per MD

## 2021-05-31 NOTE — ED Notes (Signed)
This RN, Mimi RN, Henry Russel, MD Vicente Males, officer from [employer], and several [employer] officers to bedside. Patient again asked to consent to COVID swab; patient declined. MD explained to patient the reasoning for test. Patient refused. MD explained that patient would then receive IM medication if patient continued to refuse and that test would be performed after - patient stated: "I don't care. If you hold me down I'll sue this hospital". Patient continued to refuse testing and medication. Patient restrained by Tax adviser. This RN administered IM haldol and ativan to right deltoid. Mimi RN administered IM benadryl to left deltoid and obtained COVID test. RNs, MD, NT, and security left the room. Patient currently agitated, yelling in room while in bed.
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
## 2021-05-31 NOTE — ED Notes (Signed)
RN to bedside to speak with patient about COVID swab. Reasoning for test explained to patient. Patient continued to refuse test. RN explained to patient that as she is under IVC, she cannot refuse the COVID swab. Patient reported we would have to hold her down and continued to refuse. RN explained that it was possible if she continued to refuse the doctor would in fact order IM shots of sedatives to be given and the test would be performed while the patient was restrained. Patient continued to refuse, stating: "I'm in good with president Biden and if you do that the whole lobby will be full of people in 5 minutes." RN again tried to explain importance and reasoning for test. Patient continued to refuse. RN informed patient that she would then being going to speak with the doctor. RN left room to speak to MD Bradler.

## 2021-05-31 NOTE — ED Notes (Signed)
IVC prior to arrival/ Consult ordered 

## 2021-05-31 NOTE — ED Notes (Signed)
IVC prior to arrival  

## 2021-05-31 NOTE — ED Notes (Signed)
This tech went to obtain Covid 19 swab , but pt refused, told RN and she is going in to obtain covid swab.

## 2021-05-31 NOTE — ED Triage Notes (Signed)
Pt to ED under IVC from home via [place].  States got into an argument with husband today, pt mentions if husband commits her should would divorce him and take him to court.  IVC paperwork states pt has been treated psychiatrically before, has been having panic attacks at home and not allowing husband to go to work and mentions taking son and not staying at their house.  Papers state husband did not feel safe leaving children with pt not knowing what she might do.  Pt denies SI/HI, A/V hallucinations, is calm and cooperative in triage, denies physical pain.  Pt dressed into hospital appropriate scrubs by this RN and Beth, EDT.  Contents placed into 1 bag and include: 1 green tank top, 1 white bra, 1 jean shorts, 1 pair brown flip flops, 1 pair blue underwear, 1 wallet, 1 hair tie, 1 white ring, 1 white belly button ring.  Pt unable to sign MSE waiver d/t IVC status.

## 2021-06-01 ENCOUNTER — Emergency Department: Payer: Self-pay

## 2021-06-01 DIAGNOSIS — F312 Bipolar disorder, current episode manic severe with psychotic features: Secondary | ICD-10-CM

## 2021-06-01 DIAGNOSIS — F302 Manic episode, severe with psychotic symptoms: Secondary | ICD-10-CM

## 2021-06-01 LAB — GASTROINTESTINAL PANEL BY PCR, STOOL (REPLACES STOOL CULTURE)

## 2021-06-01 LAB — C DIFFICILE QUICK SCREEN W PCR REFLEX
C Diff antigen: NEGATIVE
C Diff interpretation: NOT DETECTED
C Diff toxin: NEGATIVE

## 2021-06-01 MED ORDER — HALOPERIDOL LACTATE 5 MG/ML IJ SOLN
INTRAMUSCULAR | Status: AC
  Administered 2021-06-01: 5 mg via INTRAVENOUS
  Filled 2021-06-01: qty 1

## 2021-06-01 MED ORDER — ONDANSETRON 4 MG PO TBDP: 4.0000 mg | ORAL_TABLET | Freq: Once | ORAL | Status: AC

## 2021-06-01 MED ORDER — LISINOPRIL 5 MG PO TABS
5.0000 mg | ORAL_TABLET | Freq: Every day | ORAL | Status: DC
  Administered 2021-06-01: 5 mg via ORAL
  Filled 2021-06-01: qty 1

## 2021-06-01 MED ORDER — ACETAMINOPHEN 500 MG PO TABS
1000.0000 mg | ORAL_TABLET | Freq: Four times a day (QID) | ORAL | Status: DC | PRN
  Administered 2021-06-01: 1000 mg via ORAL

## 2021-06-01 MED ORDER — ONDANSETRON 4 MG PO TBDP
ORAL_TABLET | ORAL | Status: AC
  Administered 2021-06-01: 4 mg via ORAL
  Filled 2021-06-01: qty 1

## 2021-06-01 MED ORDER — ONDANSETRON HCL 4 MG/2ML IJ SOLN
4.0000 mg | Freq: Once | INTRAMUSCULAR | Status: AC
  Administered 2021-06-01: 4 mg via INTRAVENOUS
  Filled 2021-06-01: qty 2

## 2021-06-01 MED ORDER — BENZTROPINE MESYLATE 1 MG PO TABS
0.5000 mg | ORAL_TABLET | Freq: Two times a day (BID) | ORAL | Status: DC
  Administered 2021-06-01 (×2): 0.5 mg via ORAL
  Filled 2021-06-01 (×2): qty 1

## 2021-06-01 MED ORDER — IBUPROFEN 600 MG PO TABS
600.0000 mg | ORAL_TABLET | Freq: Four times a day (QID) | ORAL | Status: DC | PRN
  Administered 2021-06-01: 600 mg via ORAL
  Filled 2021-06-01: qty 1

## 2021-06-01 MED ORDER — ONDANSETRON 4 MG PO TBDP
4.0000 mg | ORAL_TABLET | Freq: Three times a day (TID) | ORAL | Status: DC | PRN
  Administered 2021-06-01: 4 mg via ORAL
  Filled 2021-06-01 (×3): qty 1

## 2021-06-01 MED ORDER — ONDANSETRON 4 MG PO TBDP
4.0000 mg | ORAL_TABLET | Freq: Once | ORAL | Status: AC
  Administered 2021-06-01: 4 mg via ORAL
  Filled 2021-06-01: qty 1

## 2021-06-01 MED ORDER — ACETAMINOPHEN 500 MG PO TABS
1000.0000 mg | ORAL_TABLET | Freq: Once | ORAL | Status: DC
  Filled 2021-06-01: qty 2

## 2021-06-01 MED ORDER — RISPERIDONE 1 MG PO TBDP
2.0000 mg | ORAL_TABLET | Freq: Two times a day (BID) | ORAL | Status: DC
  Administered 2021-06-01 (×2): 2 mg via ORAL
  Filled 2021-06-01: qty 2
  Filled 2021-06-01: qty 4
  Filled 2021-06-01: qty 2
  Filled 2021-06-01: qty 4

## 2021-06-01 MED ORDER — HALOPERIDOL LACTATE 5 MG/ML IJ SOLN: 5.0000 mg | Freq: Once | INTRAMUSCULAR | Status: AC

## 2021-06-01 MED ORDER — SODIUM CHLORIDE 0.9 % IV BOLUS
1000.0000 mL | Freq: Once | INTRAVENOUS | Status: AC
Start: 2021-06-01 — End: 2021-06-01
  Administered 2021-06-01: 1000 mL via INTRAVENOUS

## 2021-06-01 NOTE — ED Notes (Signed)
Report to include Situation, Background, Assessment, and Recommendations received from [facility]. Patient alert and oriented, warm and dry, in no acute distress. Patient denies SI, HI and AVH. She reported back pain. Patient made aware of Q15 minute rounds and security cameras for their safety. Patient instructed to come to me with needs or concerns.

## 2021-06-01 NOTE — ED Notes (Signed)
Pt moved to BHU2. Orieted to unit and staff. No complaints at this time.

## 2021-06-01 NOTE — Consult Note (Signed)
[facility]   Reason for Consult: Consult for 35 year old woman with a history of past episodes of psychosis who presents with recent worsening of psychotic symptoms Referring Physician: [place] Patient Identification: Tricia Kerr MRN:  [id number] Principal Diagnosis: Bipolar affective disorder, current episode manic (HCC) Diagnosis:  Principal Problem:   Bipolar affective disorder, current episode manic (HCC)   Total Time spent with patient: 1 hour  Subjective:   Tricia Kerr is a 35 y.o. female patient admitted with "I am fine".  HPI: Patient seen chart reviewed.  Commitment papers filed by husband reports that the patient has been acting bizarrely and showing evidence of psychotic thinking.  Walking outside claiming that she is looking for her daughter when in fact her daughter was known to be sleeping in the house.  Making bizarre comments to her husband and appearing to be paranoid about the house.  On evaluation today the patient was disorganized in her thinking.  Referred to her husband as her "ex-husband".  Could not explain why she would wish it to be that way exactly but made a bunch of negative comments about her husband.  Patient admits to daily marijuana use.  She minimizes or denies symptoms claiming that her mood is fine and denying any hallucinations or paranoia.  Denies suicidal or homicidal thought.  Past Psychiatric History: Past history of several prior episodes of psychosis with the most recent one known could be in 2016.  Was admitted to the hospital at that time eventually recovered with Risperdal.  Indicates that she did follow up at least for some period of time with [facility] but then discontinued treatment.  Prior to that there are reports in the chart that she had hospitalizations in her younger years.  The pattern suggests likely bipolar disorder with recurrent psychotic episodes  Risk to Self:   Risk to Others:   Prior Inpatient Therapy:    Prior Outpatient Therapy:    Past Medical History:  Past Medical History:  Diagnosis Date   Anxiety    Bipolar disorder [facility](HCC)     Past Surgical History:  Procedure Laterality Date   APPENDECTOMY     TUBAL LIGATION     tubial     Family History: History reviewed. No pertinent family history. Family Psychiatric  History: None known Social History:  Social History   Substance and Sexual Activity  Alcohol Use Yes     Social History   Substance and Sexual Activity  Drug Use Yes   Types: Marijuana   Comment: months    Social History   Socioeconomic History   Marital status: Divorced    Spouse name: Not on file   Number of children: Not on file   Years of education: Not on file   Highest education level: Not on file  Occupational History   Not on file  Tobacco Use   Smoking status: Every Day    Packs/day: 1.00    Pack years: 0.00    Types: Cigarettes   Smokeless tobacco: Never  Substance and Sexual Activity   Alcohol use: Yes   Drug use: Yes    Types: Marijuana    Comment: months   Sexual activity: Not Currently  Other Topics Concern   Not on file  Social History Narrative   Not on file   Social Determinants of Health   Financial Resource Strain: Not on file  Food Insecurity: Not on file  Transportation Needs: Not on file  Physical Activity: Not on file  Stress: Not on file  Social Connections: Not on file   Additional Social History:    Allergies:   Allergies  Allergen Reactions   Nicotine Anaphylaxis    Nicotine patches     Labs:  Results for orders placed or performed during the hospital encounter of 05/31/21 (from the past 48 hour(s))  Comprehensive metabolic panel     Status: Abnormal   Collection Time: 05/31/21  9:05 PM  Result Value Ref Range   Sodium 139 135 - 145 mmol/L   Potassium 3.9 3.5 - 5.1 mmol/L   Chloride 104 98 - 111 mmol/L   CO2 28 22 - 32 mmol/L   Glucose, Bld 122 (H) 70 - 99 mg/dL    Comment: Glucose reference range  applies only to samples taken after fasting for at least 8 hours.   BUN 12 6 - 20 mg/dL   Creatinine, Ser 1.85 0.44 - 1.00 mg/dL   Calcium 63.1 8.9 - 49.7 mg/dL   Total Protein 9.0 (H) 6.5 - 8.1 g/dL   Albumin 4.9 3.5 - 5.0 g/dL   AST 27 15 - 41 U/L   ALT 28 0 - 44 U/L   Alkaline Phosphatase 107 38 - 126 U/L   Total Bilirubin 1.1 0.3 - 1.2 mg/dL   GFR, Estimated >02 >63 mL/min    Comment: (NOTE) Calculated using the CKD-EPI Creatinine Equation (2021)    Anion gap 7 5 - 15    Comment: Performed at [facility], [address] [place], [place] [address]  Ethanol     Status: None   Collection Time: 05/31/21  9:05 PM  Result Value Ref Range   Alcohol, Ethyl (B) <10 <10 mg/dL    Comment: (NOTE) Lowest detectable limit for serum alcohol is 10 mg/dL.  For medical purposes only. Performed at [facility], [address] [place], [place] [address]   Salicylate level     Status: Abnormal   Collection Time: 05/31/21  9:05 PM  Result Value Ref Range   Salicylate Lvl <7.0 (L) 7.0 - 30.0 mg/dL    Comment: Performed at [facility], [address] [place], [place] [address]  Acetaminophen level     Status: Abnormal   Collection Time: 05/31/21  9:05 PM  Result Value Ref Range   Acetaminophen (Tylenol), Serum <10 (L) 10 - 30 ug/mL    Comment: (NOTE) Therapeutic concentrations vary significantly. A range of 10-30 ug/mL  may be an effective concentration for many patients. However, some  are best treated at concentrations outside of this range. Acetaminophen concentrations >150 ug/mL at 4 hours after ingestion  and >50 ug/mL at 12 hours after ingestion are often associated with  toxic reactions.  Performed at [facility], [address] [place], [place] [address]   cbc     Status: Abnormal   Collection Time: 05/31/21  9:05 PM  Result Value Ref Range   WBC 18.9 (H) 4.0 - 10.5 K/uL   RBC 5.45 (H) 3.87 - 5.11 MIL/uL   Hemoglobin 15.9 (H) 12.0  - 15.0 g/dL   HCT 20.9 (H) 47.0 - 96.2 %   MCV 84.6 80.0 - 100.0 fL   MCH 29.2 26.0 - 34.0 pg   MCHC 34.5 30.0 - 36.0 g/dL   RDW 83.6 62.9 - 47.6 %   Platelets 395 150 - 400 K/uL   nRBC 0.0 0.0 - 0.2 %    Comment: Performed at [facility], [address], [place], [place]  [address]  Urine Drug Screen, Qualitative     Status: Abnormal   Collection Time: 05/31/21  9:05 PM  Result Value Ref Range   Tricyclic, Ur Screen NONE DETECTED NONE DETECTED   Amphetamines, Ur Screen NONE DETECTED NONE DETECTED   MDMA (Ecstasy)Ur Screen NONE DETECTED NONE DETECTED   Cocaine Metabolite,Ur [place] NONE DETECTED NONE DETECTED   Opiate, Ur Screen NONE DETECTED NONE DETECTED   Phencyclidine (PCP) Ur S NONE DETECTED NONE DETECTED   Cannabinoid 50 Ng, Ur [place] POSITIVE (A) NONE DETECTED   Barbiturates, Ur Screen NONE DETECTED NONE DETECTED   Benzodiazepine, Ur Scrn NONE DETECTED NONE DETECTED   Methadone Scn, Ur NONE DETECTED NONE DETECTED    Comment: (NOTE) Tricyclics + metabolites, urine    Cutoff 1000 ng/mL Amphetamines + metabolites, urine  Cutoff 1000 ng/mL MDMA (Ecstasy), urine              Cutoff 500 ng/mL Cocaine Metabolite, urine          Cutoff 300 ng/mL Opiate + metabolites, urine        Cutoff 300 ng/mL Phencyclidine (PCP), urine         Cutoff 25 ng/mL Cannabinoid, urine                 Cutoff 50 ng/mL Barbiturates + metabolites, urine  Cutoff 200 ng/mL Benzodiazepine, urine              Cutoff 200 ng/mL Methadone, urine                   Cutoff 300 ng/mL  The urine drug screen provides only a preliminary, unconfirmed analytical test result and should not be used for non-medical purposes. Clinical consideration and professional judgment should be applied to any positive drug screen result due to possible interfering substances. A more specific alternate chemical method must be used in order to obtain a confirmed analytical result. Gas chromatography / mass spectrometry (GC/MS) is  the preferred confirm atory method. Performed at [facility], [address] [place], [place] [address]   Urinalysis, Complete w Microscopic Urine, Clean Catch     Status: Abnormal   Collection Time: 05/31/21  9:05 PM  Result Value Ref Range   Color, Urine YELLOW (A) YELLOW   APPearance TURBID (A) CLEAR   Specific Gravity, Urine 1.028 1.005 - 1.030   pH 5.0 5.0 - 8.0   Glucose, UA NEGATIVE NEGATIVE mg/dL   Hgb urine dipstick NEGATIVE NEGATIVE   Bilirubin Urine NEGATIVE NEGATIVE   Ketones, ur 5 (A) NEGATIVE mg/dL   Protein, ur 30 (A) NEGATIVE mg/dL   Nitrite NEGATIVE NEGATIVE   Leukocytes,Ua NEGATIVE NEGATIVE   WBC, UA 0-5 0 - 5 WBC/hpf   Bacteria, UA NONE SEEN NONE SEEN   Squamous Epithelial / LPF 0-5 0 - 5   Mucus PRESENT    Amorphous Crystal PRESENT     Comment: Performed at [facility], [address] [place], [place] [address]  POC urine preg, ED     Status: None   Collection Time: 05/31/21  9:17 PM  Result Value Ref Range   Preg Test, Ur NEGATIVE NEGATIVE    Comment:        THE SENSITIVITY OF THIS METHODOLOGY IS >24 mIU/mL   Resp Panel by RT-PCR (Flu A&B, Covid) Nasopharyngeal Swab     Status: None   Collection Time: 05/31/21 10:04 PM   Specimen: Nasopharyngeal Swab; Nasopharyngeal(NP) swabs in vial transport medium  Result Value  Ref Range   SARS Coronavirus 2 by RT PCR NEGATIVE NEGATIVE    Comment: (NOTE) SARS-CoV-2 target nucleic acids are NOT DETECTED.  The SARS-CoV-2 RNA is generally detectable in upper respiratory specimens during the acute phase of infection. The lowest concentration of SARS-CoV-2 viral copies this assay can detect is 138 copies/mL. A negative result does not preclude SARS-Cov-2 infection and should not be used as the sole basis for treatment or other patient management decisions. A negative result may occur with  improper specimen collection/handling, submission of specimen other than nasopharyngeal swab, presence of  viral mutation(s) within the areas targeted by this assay, and inadequate number of viral copies(<138 copies/mL). A negative result must be combined with clinical observations, patient history, and epidemiological information. The expected result is Negative.  Fact Sheet for Patients:  BloggerCourse.com  Fact Sheet for Healthcare Providers:  SeriousBroker.it  This test is no t yet approved or cleared by the [place] FDA and  has been authorized for detection and/or diagnosis of SARS-CoV-2 by FDA under an Emergency Use Authorization (EUA). This EUA will remain  in effect (meaning this test can be used) for the duration of the COVID-19 declaration under Section 564(b)(1) of the Act, 21 U.S.C.section 360bbb-3(b)(1), unless the authorization is terminated  or revoked sooner.       Influenza A by PCR NEGATIVE NEGATIVE   Influenza B by PCR NEGATIVE NEGATIVE    Comment: (NOTE) The Xpert Xpress SARS-CoV-2/FLU/RSV plus assay is intended as an aid in the diagnosis of influenza from Nasopharyngeal swab specimens and should not be used as a sole basis for treatment. Nasal washings and aspirates are unacceptable for Xpert Xpress SARS-CoV-2/FLU/RSV testing.  Fact Sheet for Patients: BloggerCourse.com  Fact Sheet for Healthcare Providers: SeriousBroker.it  This test is not yet approved or cleared by the [place] FDA and has been authorized for detection and/or diagnosis of SARS-CoV-2 by FDA under an Emergency Use Authorization (EUA). This EUA will remain in effect (meaning this test can be used) for the duration of the COVID-19 declaration under Section 564(b)(1) of the Act, 21 U.S.C. section 360bbb-3(b)(1), unless the authorization is terminated or revoked.  Performed at [facility], [address] [place], [place] [address]   C Difficile Quick Screen w PCR reflex      Status: None   Collection Time: 06/01/21  5:55 AM   Specimen: STOOL  Result Value Ref Range   C Diff antigen NEGATIVE NEGATIVE   C Diff toxin NEGATIVE NEGATIVE   C Diff interpretation No C. difficile detected.     Comment: Performed at [facility], [address] [place], [place] [address]  Gastrointestinal Panel by PCR , Stool     Status: None   Collection Time: 06/01/21  5:55 AM   Specimen: STOOL  Result Value Ref Range   Campylobacter species NOT DETECTED NOT DETECTED   Plesimonas shigelloides NOT DETECTED NOT DETECTED   Salmonella species NOT DETECTED NOT DETECTED   Yersinia enterocolitica NOT DETECTED NOT DETECTED   Vibrio species NOT DETECTED NOT DETECTED   Vibrio cholerae NOT DETECTED NOT DETECTED   Enteroaggregative E coli (EAEC) NOT DETECTED NOT DETECTED   Enteropathogenic E coli (EPEC) NOT DETECTED NOT DETECTED   Enterotoxigenic E coli (ETEC) NOT DETECTED NOT DETECTED   Shiga like toxin producing E coli (STEC) NOT DETECTED NOT DETECTED   Shigella/Enteroinvasive E coli (EIEC) NOT DETECTED NOT DETECTED   Cryptosporidium NOT DETECTED NOT DETECTED   Cyclospora cayetanensis NOT DETECTED NOT  DETECTED   Entamoeba histolytica NOT DETECTED NOT DETECTED   Giardia lamblia NOT DETECTED NOT DETECTED   Adenovirus F40/41 NOT DETECTED NOT DETECTED   Astrovirus NOT DETECTED NOT DETECTED   Norovirus GI/GII NOT DETECTED NOT DETECTED   Rotavirus A NOT DETECTED NOT DETECTED   Sapovirus (I, II, IV, and V) NOT DETECTED NOT DETECTED    Comment: Performed at [facility], [address] [place], [place] [address]    Current Facility-Administered Medications  Medication Dose Route Frequency Provider Last Rate Last Admin   benztropine (COGENTIN) tablet 0.5 mg  0.5 mg Oral BID Kam Rahimi T, MD       lisinopril (ZESTRIL) tablet 5 mg  5 mg Oral Daily Raedyn Klinck T, MD       risperiDONE (RISPERDAL M-TABS) disintegrating tablet 2 mg  2 mg Oral BID Gibran Veselka, Jackquline Denmark, MD        No current outpatient medications on file.    Musculoskeletal: Strength & Muscle Tone: within normal limits Gait & Station: normal Patient leans: N/A            Psychiatric Specialty Exam:  Presentation  General Appearance:  No data recorded Eye Contact: No data recorded Speech: No data recorded Speech Volume: No data recorded Handedness: No data recorded  Mood and Affect  Mood: No data recorded Affect: No data recorded  Thought Process  Thought Processes: No data recorded Descriptions of Associations:No data recorded Orientation:No data recorded Thought Content:No data recorded History of Schizophrenia/Schizoaffective disorder:Yes (Schizophreniform)  Duration of Psychotic Symptoms:Greater than six months  Hallucinations:No data recorded Ideas of Reference:No data recorded Suicidal Thoughts:No data recorded Homicidal Thoughts:No data recorded  Sensorium  Memory: No data recorded Judgment: No data recorded Insight: No data recorded  Executive Functions  Concentration: No data recorded Attention Span: No data recorded Recall: No data recorded Fund of Knowledge: No data recorded Language: No data recorded  Psychomotor Activity  Psychomotor Activity: No data recorded  Assets  Assets: No data recorded  Sleep  Sleep: No data recorded  Physical Exam: Physical Exam Constitutional:      Appearance: Normal appearance.  HENT:     Head: Normocephalic and atraumatic.     Mouth/Throat:     Pharynx: Oropharynx is clear.  Eyes:     Pupils: Pupils are equal, round, and reactive to light.  Cardiovascular:     Rate and Rhythm: Normal rate and regular rhythm.  Pulmonary:     Effort: Pulmonary effort is normal.     Breath sounds: Normal breath sounds.  Abdominal:     General: Abdomen is flat.     Palpations: Abdomen is soft.  Musculoskeletal:        General: Normal range of motion.  Skin:    General: Skin is warm and dry.   Neurological:     General: No focal deficit present.     Mental Status: She is alert. Mental status is at baseline.  Psychiatric:        Attention and Perception: She is inattentive.        Mood and Affect: Mood normal. Affect is blunt.        Speech: Speech is tangential.        Behavior: Behavior is withdrawn.        Thought Content: Thought content is paranoid.        Cognition and Memory: Cognition is impaired.        Judgment: Judgment is impulsive.   Review of Systems  Constitutional: Negative.   HENT: Negative.    Eyes: Negative.   Respiratory: Negative.    Cardiovascular: Negative.   Gastrointestinal: Negative.   Musculoskeletal: Negative.   Skin: Negative.   Neurological: Negative.   Psychiatric/Behavioral:  The patient is nervous/anxious.   Blood pressure (!) 162/75, pulse (!) 52, temperature 98.1 F (36.7 C), resp. rate 16, height 4\' 9"  (1.448 m), weight 68 kg, SpO2 99 %. Body mass index is 32.46 kg/m.  Treatment Plan Summary: Medication management and Plan a 35 year old woman appears to have probably about 2 to 3 days of worsening paranoia and agitation psychotic symptoms leading to dangerous behavior around her home and children.  Poor insight.  Minimizing or denying symptoms.  Only partially cooperative with the interview.  Labs unremarkable except for positive cannabis test.  Patient meets criteria for IVC and inpatient admission.  No beds available at our hospital today recommend referral to other psychiatric hospitals at this time.  Restarted Risperdal and Cogentin.  Disposition: Recommend psychiatric Inpatient admission when medically cleared.  33, MD 06/01/2021 1:30 PM

## 2021-06-01 NOTE — ED Notes (Addendum)
Pt came to nurses station to ask for something t clean the toilet. When asked what she needed to clean she stated she had vomitted on toilet. Emesis observed in and on toilet, undigested food particles in toilet. Pt states she also vomitted earlier today after lunch. Pt states she is feeling nauseous. Als c/o HA.

## 2021-06-01 NOTE — ED Notes (Addendum)
Patient actively vomiting. Patient incontinent of stool. MD informed. MD reported she would go assess patient.

## 2021-06-01 NOTE — ED Notes (Signed)
Patient with another large emesis. MD informed. MD ordered antiemetic. Medication administered. RN will continue to monitor.

## 2021-06-01 NOTE — ED Notes (Signed)
Patient actively vomiting. MD informed.

## 2021-06-01 NOTE — ED Notes (Signed)
ED Provider at bedside. 

## 2021-06-01 NOTE — ED Notes (Signed)
Hourly rounding reveals patient in room. No complaints, stable, in no acute distress. Q15 minute rounds and monitoring via Security Cameras to continue. 

## 2021-06-01 NOTE — ED Notes (Signed)
IVC, pend placement, moved to [facility]

## 2021-06-01 NOTE — ED Provider Notes (Signed)
Emergency Medicine Observation Re-evaluation Note  Tricia Kerr is a 35 y.o. female, seen on rounds today.  Pt initially presented to the ED for complaints of Psychiatric Evaluation Currently, the patient is vomiting and having diarrhea.  Physical Exam  BP (!) 152/90 (BP Location: Left Arm)   Pulse (!) 50   Temp 98.1 F (36.7 C) (Oral)   Resp 16   Ht 4\' 9"  (1.448 m)   Wt 68 kg   LMP  (LMP Unknown)   SpO2 99%   BMI 32.46 kg/m  Physical Exam General: Mild distress Cardiac: No cyanosis Lungs: Equal rise and fall Psych: Not agitated  ED Course / MDM  EKG:   I have reviewed the labs performed to date as well as medications administered while in observation.  Recent changes in the last 24 hours include patient awoke from IM sedation with vomiting and diarrhea.  Unable to keep down ODT Zofran.  Abdomen nontender to palpation.  Agreeable to IV fluids and CT scan.  Plan  Current plan is for psychiatric disposition. Patient is under full IVC at this time.   , MD 06/01/21 [phone]

## 2021-06-01 NOTE — ED Provider Notes (Signed)
-----------------------------------------   7:54 AM on 06/01/2021 ----------------------------------------- CT scan shows 2 mm stone in the urinary bladder, potential source for patient's pain and vomiting.  No associated infection noted on her urinalysis and patient no longer vomiting on reassessment, pain is improved.  CT scan also remarkable for cholelithiasis but no signs of cholecystitis.  Stool studies are unremarkable and patient may be medically cleared for psychiatric disposition.  The patient has been placed in psychiatric observation due to the need to provide a safe environment for the patient while obtaining psychiatric consultation and evaluation, as well as ongoing medical and medication management to treat the patient's condition.  The patient has been placed under full IVC at this time.    Chesley Noon, MD 06/01/21 [phone]

## 2021-06-01 NOTE — BH Assessment (Signed)
Patient is to be admitted to [facility] by Psychiatric Nurse Practitioner Gillermo Murdoch.  Attending Physician will be Dr. Neale Burly.   Patient has been assigned to room 310, by [facility] Charge Nurse [place].    ER staff is aware of the admission: [facility] ER Secretary   Dr. Vicente Males, ER MD  [facility] Patient's Nurse  Rosey Bath Patient Access.

## 2021-06-01 NOTE — ED Notes (Signed)
Patient returned from CT. Patient reportedly vomited again in CT. MD Dolores Frame informed.

## 2021-06-01 NOTE — BH Assessment (Addendum)
Adult MH  Referral information for Psychiatric Hospitalization faxed to:   [facility] ([phone]) Per [place], call back because they have not had a chance to review packet.   [place] [phone]) Under review   [facility] [phone] -or- [phone]) Denied due to no insurance   [place] [phone]) No answer   [employer] [phone] or [phone]) [facility] [phone] or [phone])   [facility] [phone])
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
## 2021-06-01 NOTE — ED Notes (Addendum)
Patient provided with bedside commode, cleaning wipes, and clean scrubs. Patient cleaned herself and changed into clean scrubs. Patient's soiled linens removed.

## 2021-06-01 NOTE — ED Notes (Signed)
IVC/  PENDING  PLACEMENT 

## 2021-06-02 ENCOUNTER — Inpatient Hospital Stay
Admission: AD | Admit: 2021-06-02 | Discharge: 2021-06-12 | DRG: 885 | Disposition: A | Discharge status: Home / Self Care | Payer: 59 | Source: Intra-hospital | Attending: Behavioral Health | Admitting: Behavioral Health

## 2021-06-02 ENCOUNTER — Other Ambulatory Visit: Payer: Self-pay

## 2021-06-02 ENCOUNTER — Encounter: Payer: Self-pay | Admitting: Behavioral Health

## 2021-06-02 DIAGNOSIS — F312 Bipolar disorder, current episode manic severe with psychotic features: Principal | ICD-10-CM | POA: Diagnosis present

## 2021-06-02 DIAGNOSIS — G4700 Insomnia, unspecified: Secondary | ICD-10-CM | POA: Diagnosis present

## 2021-06-02 DIAGNOSIS — F17200 Nicotine dependence, unspecified, uncomplicated: Secondary | ICD-10-CM | POA: Diagnosis present

## 2021-06-02 DIAGNOSIS — F319 Bipolar disorder, unspecified: Secondary | ICD-10-CM | POA: Diagnosis present

## 2021-06-02 DIAGNOSIS — F302 Manic episode, severe with psychotic symptoms: Secondary | ICD-10-CM | POA: Diagnosis not present

## 2021-06-02 DIAGNOSIS — F1220 Cannabis dependence, uncomplicated: Secondary | ICD-10-CM | POA: Diagnosis present

## 2021-06-02 DIAGNOSIS — F17210 Nicotine dependence, cigarettes, uncomplicated: Secondary | ICD-10-CM | POA: Diagnosis present

## 2021-06-02 DIAGNOSIS — Z20822 Contact with and (suspected) exposure to covid-19: Secondary | ICD-10-CM | POA: Diagnosis present

## 2021-06-02 DIAGNOSIS — F29 Unspecified psychosis not due to a substance or known physiological condition: Secondary | ICD-10-CM | POA: Diagnosis present

## 2021-06-02 DIAGNOSIS — Z720 Tobacco use: Secondary | ICD-10-CM | POA: Diagnosis present

## 2021-06-02 MED ORDER — BENZTROPINE MESYLATE 1 MG PO TABS
0.5000 mg | ORAL_TABLET | Freq: Two times a day (BID) | ORAL | Status: DC
  Administered 2021-06-02 – 2021-06-12 (×19): 0.5 mg via ORAL
  Filled 2021-06-02 (×20): qty 1

## 2021-06-02 MED ORDER — ALUM & MAG HYDROXIDE-SIMETH 200-200-20 MG/5ML PO SUSP: 30.0000 mL | ORAL | Status: DC | PRN

## 2021-06-02 MED ORDER — RISPERIDONE 1 MG PO TBDP
2.0000 mg | ORAL_TABLET | Freq: Two times a day (BID) | ORAL | Status: DC
  Administered 2021-06-02 – 2021-06-06 (×8): 2 mg via ORAL
  Filled 2021-06-02 (×8): qty 2

## 2021-06-02 MED ORDER — ACETAMINOPHEN 500 MG PO TABS
1000.0000 mg | ORAL_TABLET | Freq: Four times a day (QID) | ORAL | Status: DC | PRN
  Administered 2021-06-06 – 2021-06-10 (×6): 1000 mg via ORAL
  Filled 2021-06-02 (×6): qty 2

## 2021-06-02 MED ORDER — IBUPROFEN 600 MG PO TABS
600.0000 mg | ORAL_TABLET | Freq: Four times a day (QID) | ORAL | Status: DC | PRN
  Administered 2021-06-06 – 2021-06-12 (×2): 600 mg via ORAL
  Filled 2021-06-02 (×2): qty 1

## 2021-06-02 MED ORDER — LISINOPRIL 5 MG PO TABS: 5.0000 mg | ORAL_TABLET | Freq: Every day | ORAL | Status: DC

## 2021-06-02 MED ORDER — MAGNESIUM HYDROXIDE 400 MG/5ML PO SUSP: 30.0000 mL | Freq: Every day | ORAL | Status: DC | PRN

## 2021-06-02 MED ORDER — HYDROXYZINE HCL 50 MG PO TABS
50.0000 mg | ORAL_TABLET | Freq: Once | ORAL | Status: AC
  Administered 2021-06-02: 50 mg via ORAL
  Filled 2021-06-02: qty 1

## 2021-06-02 MED ORDER — ONDANSETRON 4 MG PO TBDP
4.0000 mg | ORAL_TABLET | Freq: Three times a day (TID) | ORAL | Status: DC | PRN
  Administered 2021-06-07 – 2021-06-09 (×2): 4 mg via ORAL
  Filled 2021-06-02 (×2): qty 1

## 2021-06-02 NOTE — BHH Group Notes (Signed)
LCSW Group Therapy Note  06/02/2021 1:47 PM  Type of Therapy/Topic:  Group Therapy:  Balance in Life  Participation Level:  Active  Description of Group:    This group will address the concept of balance and how it feels and looks when one is unbalanced. Patients will be encouraged to process areas in their lives that are out of balance and identify reasons for remaining unbalanced. Facilitators will guide patients in utilizing problem-solving interventions to address and correct the stressor making their life unbalanced. Understanding and applying boundaries will be explored and addressed for obtaining and maintaining a balanced life. Patients will be encouraged to explore ways to assertively make their unbalanced needs known to significant others in their lives, using other group members and facilitator for support and feedback.  Therapeutic Goals: Patient will identify two or more emotions or situations they have that consume much of in their lives. Patient will identify signs/triggers that life has become out of balance:  Patient will identify two ways to set boundaries in order to achieve balance in their lives:  Patient will demonstrate ability to communicate their needs through discussion and/or role plays  Summary of Patient Progress: Patient was present for the entirety of the group session. Patient was an active listener and participated in the topic of discussion, provided helpful advice to others, and added nuance to topic of conversation. Patient shared that she has difficulty with setting healthy bounderies with people. She reports that she can be too guarded or too open with people depending on her mood.      Therapeutic Modalities:   Cognitive Behavioral Therapy Solution-Focused Therapy Assertiveness Training  Gwenevere Ghazi, MSW, [place], [place] 06/02/2021 1:47 PM

## 2021-06-02 NOTE — Progress Notes (Signed)
Admission Note:  35 yr Female who presents IVC in no acute distress for the treatment of  Mania ; Bizarre Behavior. Patient on arrival appears flat and sad but receptive to staff. Patient  was calm and cooperative with admission process, she currently denies SI/HI/AVH and contracts for safety upon admission.  Per report patient was involuntarily committed by her husband for acting bizarre, disorganized thoughts and paranoia.   Patient has Past medical Hx of Depression, Bipolar Disorder . Patient's skin was assessed and found to be  warm, dry and intact, she was also searched and no contraband found, POC and unit policies explained, understanding verbalized and consents obtained. Patient was oriented to the unit, no distress noted 15 minutes safety checks maintained will continue to monitor.
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
## 2021-06-02 NOTE — Progress Notes (Signed)
Recreation Therapy Notes  INPATIENT RECREATION TR PLAN  Patient Details Name: Tricia Kerr MRN: [id number] DOB: [birth year] Today's Date: 06/02/2021  Rec Therapy Plan Is patient appropriate for Therapeutic Recreation?: Yes Treatment times per week: at least 3 Estimated Length of Stay: 5-7 days TR Treatment/Interventions: Group participation (Comment)  Discharge Criteria Pt will be discharged from therapy if:: Discharged Treatment plan/goals/alternatives discussed and agreed upon by:: Patient/family  Discharge Summary     Nature Vogelsang 06/02/2021, 3:27 PM

## 2021-06-02 NOTE — ED Notes (Signed)
VS not taken, patient asleep 
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
## 2021-06-02 NOTE — ED Notes (Signed)
Hourly rounding reveals patient in room. No complaints, stable, in no acute distress. Q15 minute rounds and monitoring via Security Cameras to continue. 
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
## 2021-06-02 NOTE — ED Notes (Signed)
Patient is calm and cooperative. She is stable with no acute distress. She is transferred to [facility] via NT and officer. Report given to [facility] RN. Patient belonging and paper works sent to [employer]. No issues

## 2021-06-02 NOTE — Tx Team (Signed)
Initial Treatment Plan 06/02/2021 4:19 AM Tricia Kerr IHK:[id number]    PATIENT STRESSORS: Financial difficulties Marital or family conflict Substance abuse   PATIENT STRENGTHS: Motivation for treatment/growth Supportive family/friends   PATIENT IDENTIFIED PROBLEMS: Bipolar disorder   Substance Abuse   Depression                  DISCHARGE CRITERIA:  Improved stabilization in mood, thinking, and/or behavior Motivation to continue treatment in a less acute level of care  PRELIMINARY DISCHARGE PLAN: Outpatient therapy  PATIENT/FAMILY INVOLVEMENT: This treatment plan has been presented to and reviewed with the patient, Tricia Kerr, The patient and family have been given the opportunity to ask questions and make suggestions.  Trula Ore, RN 06/02/2021, 4:19 AM

## 2021-06-02 NOTE — Plan of Care (Signed)
Pt denies depression, anxiety, SI, HI and AVH. Pt was educated on care plan and verbalizes understanding. Torrie Mayers RN Problem: Consults Goal: Concurrent Medical Patient Education Description: (See Patient Education Module for education specifics) Outcome: Progressing   Problem: [facility] Concurrent Medical Problem Goal: LTG-Pt will be physically stable and he/significant other Description: (Patient will be physically stable and he/significant other will be able to verbalize understanding of follow-up care and symptoms that would warrant further treatment) Outcome: Progressing Goal: STG-Vital signs will be within defined limits or stabilized Description: (STG- Vital signs will be within defined limits or stabilized for individual) Outcome: Progressing Goal: STG-Compliance with medication and/or treatment as ordered Description: (STG-Compliance with medication and/or treatment as ordered by MD) Outcome: Progressing Goal: STG-Verbalize two symptoms that would warrant further Description: (STG-Verbalize two symptoms that would warrant further treatment) Outcome: Progressing Goal: STG-Patient will participate in management/stabilization Description: (STG-Patient will participate in management/stabilization of medical condition) Outcome: Progressing Goal: STG-Other (Specify): Description: STG-Other Concurrent Medical (Specify): Outcome: Progressing   Problem: Education: Goal: Utilization of techniques to improve thought processes will improve Outcome: Progressing Goal: Knowledge of the prescribed therapeutic regimen will improve Outcome: Progressing   Problem: Activity: Goal: Interest or engagement in leisure activities will improve Outcome: Progressing Goal: Imbalance in normal sleep/wake cycle will improve Outcome: Progressing   Problem: Coping: Goal: Coping ability will improve Outcome: Progressing Goal: Will verbalize feelings Outcome: Progressing   Problem: Health  Behavior/Discharge Planning: Goal: Ability to make decisions will improve Outcome: Progressing Goal: Compliance with therapeutic regimen will improve Outcome: Progressing   Problem: Role Relationship: Goal: Will demonstrate positive changes in social behaviors and relationships Outcome: Progressing   Problem: Safety: Goal: Ability to disclose and discuss suicidal ideas will improve Outcome: Progressing Goal: Ability to identify and utilize support systems that promote safety will improve Outcome: Progressing   Problem: Self-Concept: Goal: Will verbalize positive feelings about self Outcome: Progressing Goal: Level of anxiety will decrease Outcome: Progressing

## 2021-06-02 NOTE — Plan of Care (Signed)
  Problem: Education: Goal: Utilization of techniques to improve thought processes will improve Outcome: Progressing  Patient's thoughts are organized and coherent no distress noted, she currently denies SI/HI/AVH no bizarre behavior noted.

## 2021-06-02 NOTE — BHH Suicide Risk Assessment (Signed)
[facility] INPATIENT:  Family/Significant Other Suicide Prevention Education  Suicide Prevention Education: SPE completed with pt, as pt refused to consent to family contact. SPI pamphlet provided to pt and pt was encouraged to share information with support network, ask questions, and talk about any concerns relating to SPE. Pt denies access to guns/firearms and verbalized understanding of information provided. Mobile Crisis information also provided to pt.   Patient Refusal for Family/Significant Other Suicide Prevention Education: The patient Tricia Kerr has refused to provide written consent for family/significant other to be provided Family/Significant Other Suicide Prevention Education during admission and/or prior to discharge.  Physician notified.  Romney Compean A [place] 06/02/2021, 3:33 PM

## 2021-06-02 NOTE — Progress Notes (Signed)
Recreation Therapy Notes  Date: 06/02/2020  Time: 9:30 am   Location: Craft room   Behavioral response: N/A   Intervention Topic: Animal Assisted therapy    Discussion/Intervention: Patient did not attend group.   Clinical Observations/Feedback:  Patient did not attend group.   Moody Robben LRT/CTRS        Fisher Hargadon 06/02/2021 11:47 AM

## 2021-06-02 NOTE — Progress Notes (Signed)
Pt denies depression, anxiety, AVH, SI, and HI. Pt has been med compliant and isolative to her room except for meals. She did not attend groups. Pt appears cautious. Torrie Mayers RN

## 2021-06-02 NOTE — Progress Notes (Signed)
Recreation Therapy Notes  INPATIENT RECREATION THERAPY ASSESSMENT  Patient Details Name: Tatia Petrucci MRN: [id number] DOB: 11-Apr-1986 Today's Date: 06/02/2021       Information Obtained From: Patient  Able to Participate in Assessment/Interview: Yes  Patient Presentation: Responsive  Reason for Admission (Per Patient): Active Symptoms  Patient Stressors: Relationship  Coping Skills:   Prayer, Talk  Leisure Interests (2+):  Individual - TV, Music - Listen  Frequency of Recreation/Participation: Weekly  Awareness of Community Resources:  Yes  Community Resources:  Church  Current Use: Yes  If no, Barriers?:    Expressed Interest in [employer] Information: Yes  County [place]idence[place]  Patient Main Form of Transportation: Set designer  Patient Strengths:  Understanding  Patient Identified Areas of Improvement:  Being more independent  Patient Goal for Hospitalization:  Make it through this  Current SI (including self-harm):  No  Current HI:  No  Current AVH: No  Staff Intervention Plan: Group Attendance, Collaborate with Interdisciplinary Treatment Team  Consent to Intern Participation: N/A  Janaysha Depaulo 06/02/2021, 3:27 PM

## 2021-06-03 NOTE — Progress Notes (Signed)
Patient denies SI, HI, and AVH. She denied pain, anxiety and depression. She is observed interacting appropriately with others on the unit. She has some bizarre behaviors such as laughing and stating "I don't know" when asked why she is here. She appears as calm and is cooperative. She asked for sleep medication. Patient was given Vistaril and it was effective. Will monitor with 15 minute safety checks.

## 2021-06-03 NOTE — Progress Notes (Signed)
Recreation Therapy Notes  Date: 06/03/2021  Time: 9:45 am  Location: Courtyard     Behavioral response: Appropriate   Intervention Topic: Leisure    Discussion/Intervention:  Group content today was focused on leisure. The group defined what leisure is and some positive leisure activities they participate in. Individuals identified the difference between good and bad leisure. Participants expressed how they feel after participating in the leisure of their choice. The group discussed how they go about picking a leisure activity and if others are involved in their leisure activities. The patient stated how many leisure activities they too choose from and reasons why it is important to have leisure time. Individuals participated in the intervention "Exploring Leisure" where they had a chance to identify new leisure activities as well as benefits of leisure. Clinical Observations/Feedback:  Patient came to group and participated in leisure actives. Individual was social with peers and staff while participating in the intervention.     Illene Sweeting LRT/CTRS            Citlaly Camplin 06/03/2021 11:42 AM

## 2021-06-03 NOTE — Progress Notes (Signed)
[facility] MD Progress Note  06/03/2021 1:05 PM Tricia Kerr  MRN:  [id number]  CC "I'm okay."  Subjective:  35 year old female presenting for worsening psychosis.  No acute events overnight, medication compliant, attending to ADLs.  Patient seen during treatment team and again one-on-one.  She states her goals are to start writing more, and being more independent.  On brief encounters she is able to maintain a coherent conversation.  However with further probing her paranoid delusions become apparent.  She continues to feel negatively about her husband.  She also has a delusion about her trailer being contaminated with uranium.  This is caused her to keep her children outside during the day and not allow them to enter the house.  She has been compliant with her Risperdal, and denies any side effects of this medication.  Principal Problem: Bipolar I disorder, single manic episode, severe, with psychosis (HCC) Diagnosis: Principal Problem:   Bipolar I disorder, single manic episode, severe, with psychosis (HCC) Active Problems:   Tobacco use disorder   Cannabis use disorder, severe, dependence (HCC)  Total Time spent with patient: 30 minutes  Past Psychiatric History: See H&P  Past Medical History:  Past Medical History:  Diagnosis Date   Anxiety    Bipolar disorder (HCC)     Past Surgical History:  Procedure Laterality Date   APPENDECTOMY     TUBAL LIGATION     tubial     Family History: History reviewed. No pertinent family history. Family Psychiatric  History: See H&P Social History:  Social History   Substance and Sexual Activity  Alcohol Use Yes     Social History   Substance and Sexual Activity  Drug Use Yes   Types: Marijuana   Comment: months    Social History   Socioeconomic History   Marital status: Divorced    Spouse name: Not on file   Number of children: Not on file   Years of education: Not on file   Highest education level: Not on file  Occupational  History   Not on file  Tobacco Use   Smoking status: Every Day    Packs/day: 1.00    Pack years: 0.00    Types: Cigarettes   Smokeless tobacco: Never  Substance and Sexual Activity   Alcohol use: Yes   Drug use: Yes    Types: Marijuana    Comment: months   Sexual activity: Not Currently  Other Topics Concern   Not on file  Social History Narrative   Not on file   Social Determinants of Health   Financial Resource Strain: Not on file  Food Insecurity: Not on file  Transportation Needs: Not on file  Physical Activity: Not on file  Stress: Not on file  Social Connections: Not on file   Additional Social History:                         Sleep: Poor  Appetite:  Fair  Current Medications: Current Facility-Administered Medications  Medication Dose Route Frequency Provider Last Rate Last Admin   acetaminophen (TYLENOL) tablet 1,000 mg  1,000 mg Oral Q6H PRN Gillermo Murdoch, NP       alum & mag hydroxide-simeth (MAALOX/MYLANTA) 200-200-20 MG/5ML suspension 30 mL  30 mL Oral Q4H PRN Gillermo Murdoch, NP       benztropine (COGENTIN) tablet 0.5 mg  0.5 mg Oral BID Gillermo Murdoch, NP   0.5 mg at 06/03/21 0851   ibuprofen (ADVIL) tablet  600 mg  600 mg Oral Q6H PRN Gillermo Murdoch, NP       magnesium hydroxide (MILK OF MAGNESIA) suspension 30 mL  30 mL Oral Daily PRN Gillermo Murdoch, NP       ondansetron (ZOFRAN-ODT) disintegrating tablet 4 mg  4 mg Oral Q8H PRN Gillermo Murdoch, NP       risperiDONE (RISPERDAL M-TABS) disintegrating tablet 2 mg  2 mg Oral BID Gillermo Murdoch, NP   2 mg at 06/03/21 6283    Lab Results: No results found for this or any previous visit (from the past 48 hour(s)).  Blood Alcohol level:  Lab Results  Component Value Date   ETH <10 05/31/2021   ETH <5 03/20/2017    Metabolic Disorder Labs: No results found for: HGBA1C, MPG No results found for: PROLACTIN No results found for: CHOL, TRIG, HDL, CHOLHDL,  VLDL, LDLCALC  Physical Findings: AIMS:  , ,  ,  ,    CIWA:    COWS:     Musculoskeletal: Strength & Muscle Tone: within normal limits Gait & Station: normal Patient leans: N/A  Psychiatric Specialty Exam:  Presentation  General Appearance: Disheveled  Eye Contact:Fair  Speech:Normal Rate  Speech Volume:Normal  Handedness:Right   Mood and Affect  Mood:Anxious  Affect:Congruent   Thought Process  Thought Processes:Goal Directed  Descriptions of Associations:Intact  Orientation:Full (Time, Place and Person)  Thought Content:Paranoid Ideation; Illogical; Delusions  History of Schizophrenia/Schizoaffective disorder:Yes  Duration of Psychotic Symptoms:Greater than six months  Hallucinations:Hallucinations: Auditory  Ideas of Reference:Paranoia; Percusatory  Suicidal Thoughts:Suicidal Thoughts: No  Homicidal Thoughts:Homicidal Thoughts: No   Sensorium  Memory:Immediate Fair; Recent Fair; Remote Fair  Judgment:Impaired  Insight:Lacking   Executive Functions  Concentration:Fair  Attention Span:Fair  Recall:Fair  Fund of Knowledge:Fair  Language:Fair   Psychomotor Activity  Psychomotor Activity:Psychomotor Activity: Normal   Assets  Assets:Desire for Improvement; Housing; Physical Health; Resilience; Social Support   Sleep  Sleep:Sleep: Poor Number of Hours of Sleep: 3.45    Physical Exam: Physical Exam ROS Blood pressure (!) 145/86, pulse 66, temperature 98.5 F (36.9 C), temperature source Oral, resp. rate 18, height 4\' 9"  (1.448 m), weight 67.6 kg, SpO2 99 %. Body mass index is 32.24 kg/m.   Treatment Plan Summary: Daily contact with patient to assess and evaluate symptoms and progress in treatment and Medication management35 year old female presenting with acute psychosis and mania.  Continue Risperdal M tabs 2 mg twice daily along with Cogentin for EPS prophylaxis.  Continue Zofran as needed for nausea and vomiting.  Will  encourage her to switch to a long-acting injectable during admission.  35, MD 06/03/2021, 1:05 PM

## 2021-06-03 NOTE — B[facility] Group Notes (Signed)
LCSW Group Therapy Note     06/03/2021 3:05 PM  Type of Therapy and Topic:  Group Therapy:  Feelings around Relapse and Recovery  Participation Level:  Active  Description of Group:    Patients in this group will discuss emotions they experience before and after a relapse. They will process how experiencing these feelings, or avoidance of experiencing them, relates to having a relapse. Facilitator will guide patients to explore emotions they have related to recovery. Patients will be encouraged to process which emotions are more powerful. They will be guided to discuss the emotional reaction significant others in their lives may have to their relapse or recovery. Patients will be assisted in exploring ways to respond to the emotions of others without this contributing to a relapse.     Therapeutic Goals:  1.    Patient will identify two or more emotions that lead to a relapse for them 2.    Patient will identify two emotions that result when they relapse 3.    Patient will identify two emotions related to recovery 4.    Patient will demonstrate ability to communicate their needs through discussion and/or role plays    Summary of Patient Progress: Patient was present for the entirety of the group session. Patient was an active listener and participated in the topic of discussion, provided helpful advice to others, and added nuance to topic of conversation. Patient appeared to be asleep at times in group and had difficulty focusing on topics of discussion.      Therapeutic Modalities:  Cognitive Behavioral Therapy Solution-Focused Therapy Assertiveness Training Relapse Prevention Therapy    Gwenevere Ghazi, LCSW, LCAS-A  06/03/2021 3:05 PM

## 2021-06-03 NOTE — [facility] Group Notes (Signed)
[facility] Group Notes:  (Nursing/MHT/Case Management/Adjunct)  Date:  06/03/2021  Time:  9:56 PM  Type of Therapy:  Group Therapy  Participation Level:  Active  Participation Quality:  Appropriate  Affect:  Appropriate  Cognitive:  Alert  Insight:  Good  Engagement in Group:  Engaged and her goal is to get rid of anxiety.  Modes of Intervention:  Support  Summary of Progress/Problems:  Tricia Kerr 06/03/2021, 9:56 PM

## 2021-06-03 NOTE — Progress Notes (Signed)
D: Pt alert and oriented. Pt rates depression 0/10, hopelessness 0/10, and anxiety 4/10. Pt goal: "Trying to get out." Pt reports energy level as low and concentration as being good. Pt reports sleep last night as being poor. Pt did receive medications for sleep and did not find them helpful. Pt reports experiencing 5/10 Left Knee pain, prn meds offered and refuse. Pt denies experiencing any SI/HI, or AVH at this time.   A: Scheduled medications administered to pt, per MD orders. Support and encouragement provided. Frequent verbal contact made. Routine safety checks conducted q15 minutes.   R: No adverse drug reactions noted. Pt verbally contracts for safety at this time. Pt complaint with medications and treatment plan. Pt interacts well with others on the unit. Pt remains safe at this time. Will continue to monitor.

## 2021-06-03 NOTE — Tx Team (Signed)
Interdisciplinary Treatment and Diagnostic Plan Update  06/03/2021 Time of Session: 0900 Tricia Kerr MRN: [id number]  Principal Diagnosis: Bipolar I disorder, single manic episode, severe, with psychosis ([place])  Secondary Diagnoses: Principal Problem:   Bipolar I disorder, single manic episode, severe, with psychosis ([place]) Active Problems:   Tobacco use disorder   Cannabis use disorder, severe, dependence ([place])   Current Medications:  Current Facility-Administered Medications  Medication Dose Route Frequency Provider Last Rate Last Admin   acetaminophen (TYLENOL) tablet 1,000 mg  1,000 mg Oral Q6H PRN Caroline Sauger, NP       alum & mag hydroxide-simeth (MAALOX/MYLANTA) 200-200-20 MG/5ML suspension 30 mL  30 mL Oral Q4H PRN Caroline Sauger, NP       benztropine (COGENTIN) tablet 0.5 mg  0.5 mg Oral BID Caroline Sauger, NP   0.5 mg at 06/03/21 0851   ibuprofen (ADVIL) tablet 600 mg  600 mg Oral Q6H PRN Caroline Sauger, NP       magnesium hydroxide (MILK OF MAGNESIA) suspension 30 mL  30 mL Oral Daily PRN Caroline Sauger, NP       ondansetron (ZOFRAN-ODT) disintegrating tablet 4 mg  4 mg Oral Q8H PRN Caroline Sauger, NP       risperiDONE (RISPERDAL M-TABS) disintegrating tablet 2 mg  2 mg Oral BID Caroline Sauger, NP   2 mg at 06/03/21 6226   PTA Medications: No medications prior to admission.    Patient Stressors: Financial difficulties Marital or family conflict Substance abuse  Patient Strengths: Motivation for treatment/growth Supportive family/friends  Treatment Modalities: Medication Management, Group therapy, Case management,  1 to 1 session with clinician, Psychoeducation, Recreational therapy.   Physician Treatment Plan for Primary Diagnosis: Bipolar I disorder, single manic episode, severe, with psychosis ([place]) Long Term Goal(s): Improvement in symptoms so as ready for discharge   Short Term Goals: Ability to identify changes  in lifestyle to reduce recurrence of condition will improve Ability to verbalize feelings will improve Ability to disclose and discuss suicidal ideas Ability to demonstrate self-control will improve Ability to identify and develop effective coping behaviors will improve Ability to maintain clinical measurements within normal limits will improve Compliance with prescribed medications will improve Ability to identify triggers associated with substance abuse/mental health issues will improve  Medication Management: Evaluate patient's response, side effects, and tolerance of medication regimen.  Therapeutic Interventions: 1 to 1 sessions, Unit Group sessions and Medication administration.  Evaluation of Outcomes: Not Met  Physician Treatment Plan for Secondary Diagnosis: Principal Problem:   Bipolar I disorder, single manic episode, severe, with psychosis ([place]) Active Problems:   Tobacco use disorder   Cannabis use disorder, severe, dependence ([place])  Long Term Goal(s): Improvement in symptoms so as ready for discharge   Short Term Goals: Ability to identify changes in lifestyle to reduce recurrence of condition will improve Ability to verbalize feelings will improve Ability to disclose and discuss suicidal ideas Ability to demonstrate self-control will improve Ability to identify and develop effective coping behaviors will improve Ability to maintain clinical measurements within normal limits will improve Compliance with prescribed medications will improve Ability to identify triggers associated with substance abuse/mental health issues will improve     Medication Management: Evaluate patient's response, side effects, and tolerance of medication regimen.  Therapeutic Interventions: 1 to 1 sessions, Unit Group sessions and Medication administration.  Evaluation of Outcomes: Not Met   RN Treatment Plan for Primary Diagnosis: Bipolar I disorder, single manic episode, severe, with  psychosis ([place]) Long Term Goal(s):  Knowledge of disease and therapeutic regimen to maintain health will improve  Short Term Goals: Ability to remain free from injury will improve, Ability to verbalize frustration and anger appropriately will improve, Ability to demonstrate self-control, Ability to participate in decision making will improve, Ability to verbalize feelings will improve, Ability to disclose and discuss suicidal ideas, Ability to identify and develop effective coping behaviors will improve, and Compliance with prescribed medications will improve  Medication Management: RN will administer medications as ordered by provider, will assess and evaluate patient's response and provide education to patient for prescribed medication. RN will report any adverse and/or side effects to prescribing provider.  Therapeutic Interventions: 1 on 1 counseling sessions, Psychoeducation, Medication administration, Evaluate responses to treatment, Monitor vital signs and CBGs as ordered, Perform/monitor CIWA, COWS, AIMS and Fall Risk screenings as ordered, Perform wound care treatments as ordered.  Evaluation of Outcomes: Not Met   LCSW Treatment Plan for Primary Diagnosis: Bipolar I disorder, single manic episode, severe, with psychosis ([place]) Long Term Goal(s): Safe transition to appropriate next level of care at discharge, Engage patient in therapeutic group addressing interpersonal concerns.  Short Term Goals: Engage patient in aftercare planning with referrals and resources, Increase social support, Increase ability to appropriately verbalize feelings, Increase emotional regulation, Facilitate acceptance of mental health diagnosis and concerns, Facilitate patient progression through stages of change regarding substance use diagnoses and concerns, Identify triggers associated with mental health/substance abuse issues, and Increase skills for wellness and recovery  Therapeutic Interventions: Assess for all  discharge needs, 1 to 1 time with Social worker, Explore available resources and support systems, Assess for adequacy in community support network, Educate family and significant other(s) on suicide prevention, Complete Psychosocial Assessment, Interpersonal group therapy.  Evaluation of Outcomes: Not Met   Progress in Treatment: Attending groups: Yes. Participating in groups: Yes. Taking medication as prescribed: Yes. Toleration medication: Yes. Family/Significant other contact made: No, will contact:  Patient declined consent to reach collateral contact.  Patient understands diagnosis: Yes. Discussing patient identified problems/goals with staff: Yes. Medical problems stabilized or resolved: No. Denies suicidal/homicidal ideation: Yes. Issues/concerns per patient self-inventory: Yes. Other: none   New problem(s) identified: Yes, Describe:  Patient admitted for medication stabilization   New Short Term/Long Term Goal(s): detox, elimination of symptoms of psychosis, medication management for mood stabilization; elimination of SI thoughts; development of comprehensive mental wellness/sobriety plan.  Patient Goals:  "start working witting more . . . being more independent and stop relying on people"  Discharge Plan or Barriers: No barriers foreseen at this time.   Reason for Continuation of Hospitalization: Anxiety Delusions  Depression Medication stabilization Withdrawal symptoms  Estimated Length of Stay: 1-7 days  Attendees: Patient: Tricia Kerr 06/03/2021 9:30 AM  Physician: Selina Cooley, MD 06/03/2021 9:30 AM  Nursing: Collier Bullock, RN  06/03/2021 9:30 AM  RN Care Manager:  06/03/2021 9:30 AM  Social Worker: Paulla Dolly, MSW, [place], [place]  06/03/2021 9:30 AM  Recreational Therapist: Roanna Epley, Reather Converse, LRT  06/03/2021 9:30 AM  Other: Michell Heinrich, MSW, LCSW, LCAS  06/03/2021 9:30 AM  Other: Kiva Martinique, Ginger Blue 06/03/2021 9:30 AM  Other: 06/03/2021 9:30 AM     Scribe for Treatment Team: Durenda Hurt, LCSWA 06/03/2021 9:30 AM

## 2021-06-04 DIAGNOSIS — F302 Manic episode, severe with psychotic symptoms: Secondary | ICD-10-CM

## 2021-06-04 LAB — LIPID PANEL
Cholesterol: 170 mg/dL (ref 0–200)
HDL: 41 mg/dL (ref >40)
LDL Cholesterol: 115 mg/dL — ABNORMAL HIGH (ref 0–99)
Total CHOL/HDL Ratio: 4.1 RATIO
Triglycerides: 68 mg/dL (ref <150)
VLDL: 14 mg/dL (ref 0–40)

## 2021-06-04 LAB — HEMOGLOBIN A1C
Hgb A1c MFr Bld: 5.5 % (ref 4.8–5.6)
Mean Plasma Glucose: 111.15 mg/dL

## 2021-06-04 NOTE — [facility] Group Notes (Signed)
LCSW Group Therapy Note  06/04/2021 2:44 PM  Type of Therapy and Topic:  Group Therapy: Avoiding Self-Sabotaging and Enabling Behaviors  Participation Level:  Active   Description of Group:   In this group, patients will learn how to identify obstacles, self-sabotaging and enabling behaviors, as well as: what are they, why do we do them and what needs these behaviors meet. Discuss unhealthy relationships and how to have positive healthy boundaries with those that sabotage and enable. Explore aspects of self-sabotage and enabling in yourself and how to limit these self-destructive behaviors in everyday life.   Therapeutic Goals: Patient will identify one obstacle that relates to self-sabotage and enabling behaviors Patient will identify one personal self-sabotaging or enabling behavior they did prior to admission Patient will state a plan to change the above identified behavior Patient will demonstrate ability to communicate their needs through discussion and/or role play.   Summary of Patient Progress: Patient was present for the entirety of the group session. Patient was an active listener and participated in the topic of discussion, provided helpful advice to others, and added nuance to topic of conversation. Patient shared that she tends to be a "people pleaser" which can, at times, be an enabling behavior. Furthermore, patient shared that she would like to implement many changes after discharge to include disassociating from harmful people.     Therapeutic Modalities:   Cognitive Behavioral Therapy Person-Centered Therapy Motivational Interviewing   Gwenevere Ghazi, MSW, [place], [place] 06/04/2021 2:44 PM

## 2021-06-04 NOTE — Progress Notes (Signed)
[facility] MD Progress Note  06/04/2021 12:44 PM Tricia Kerr  MRN:  [id number] Subjective: Follow-up note for this 35 year old woman with bipolar disorder.  Patient presents as disheveled awake but appears easily distracted.  Claims to have no clear idea why she is in the hospital or what she is being treated for.  Says her chief concern is that she continues to have back pain.  Seems guarded and paranoid at times.  Not coming out to groups very much.  Tolerating medicine however nothing that would seem to be side effects of the Risperdal and currently compliant with medicine.  Blood pressure today elevated but has not been persistently so. Principal Problem: Bipolar I disorder, single manic episode, severe, with psychosis ([facility]) Diagnosis: Principal Problem:   Bipolar I disorder, single manic episode, severe, with psychosis ([facility]) Active Problems:   Tobacco use disorder   Cannabis use disorder, severe, dependence ([facility])  Total Time spent with patient: 30 minutes  Past Psychiatric History: Past history of intermittent episodes of bipolar psychosis  Past Medical History:  Past Medical History:  Diagnosis Date   Anxiety    Bipolar disorder ([facility])     Past Surgical History:  Procedure Laterality Date   APPENDECTOMY     TUBAL LIGATION     tubial     Family History: History reviewed. No pertinent family history. Family Psychiatric  History: See previous Social History:  Social History   Substance and Sexual Activity  Alcohol Use Yes     Social History   Substance and Sexual Activity  Drug Use Yes   Types: Marijuana   Comment: months    Social History   Socioeconomic History   Marital status: Divorced    Spouse name: Not on file   Number of children: Not on file   Years of education: Not on file   Highest education level: Not on file  Occupational History   Not on file  Tobacco Use   Smoking status: Every Day    Packs/day: 1.00    Pack years: 0.00    Types: Cigarettes    Smokeless tobacco: Never  Substance and Sexual Activity   Alcohol use: Yes   Drug use: Yes    Types: Marijuana    Comment: months   Sexual activity: Not Currently  Other Topics Concern   Not on file  Social History Narrative   Not on file   Social Determinants of Health   Financial Resource Strain: Not on file  Food Insecurity: Not on file  Transportation Needs: Not on file  Physical Activity: Not on file  Stress: Not on file  Social Connections: Not on file   Additional Social History:                         Sleep: Fair  Appetite:  Fair  Current Medications: Current Facility-Administered Medications  Medication Dose Route Frequency Provider Last Rate Last Admin   acetaminophen (TYLENOL) tablet 1,000 mg  1,000 mg Oral Q6H PRN Gillermo Murdoch, NP       alum & mag hydroxide-simeth (MAALOX/MYLANTA) 200-200-20 MG/5ML suspension 30 mL  30 mL Oral Q4H PRN Gillermo Murdoch, NP       benztropine (COGENTIN) tablet 0.5 mg  0.5 mg Oral BID Gillermo Murdoch, NP   0.5 mg at 06/04/21 0807   ibuprofen (ADVIL) tablet 600 mg  600 mg Oral Q6H PRN Gillermo Murdoch, NP       magnesium hydroxide (MILK OF MAGNESIA) suspension  30 mL  30 mL Oral Daily PRN Gillermo Murdoch, NP       ondansetron (ZOFRAN-ODT) disintegrating tablet 4 mg  4 mg Oral Q8H PRN Gillermo Murdoch, NP       risperiDONE (RISPERDAL M-TABS) disintegrating tablet 2 mg  2 mg Oral BID Gillermo Murdoch, NP   2 mg at 06/04/21 6962    Lab Results:  Results for orders placed or performed during the hospital encounter of 06/02/21 (from the past 48 hour(s))  Lipid panel     Status: Abnormal   Collection Time: 06/04/21  7:21 AM  Result Value Ref Range   Cholesterol 170 0 - 200 mg/dL   Triglycerides 68 <952 mg/dL   HDL 41 >84 mg/dL   Total CHOL/HDL Ratio 4.1 RATIO   VLDL 14 0 - 40 mg/dL   LDL Cholesterol 132 (H) 0 - 99 mg/dL    Comment:        Total Cholesterol/HDL:CHD Risk Coronary Heart  Disease Risk Table                     Men   Women  1/2 Average Risk   3.4   3.3  Average Risk       5.0   4.4  2 X Average Risk   9.6   7.1  3 X Average Risk  23.4   11.0        Use the calculated Patient Ratio above and the CHD Risk Table to determine the patient's CHD Risk.        ATP III CLASSIFICATION (LDL):  <100     mg/dL   Optimal  440-102  mg/dL   Near or Above                    Optimal  130-159  mg/dL   Borderline  725-366  mg/dL   High  >440     mg/dL   Very High Performed at [facility], [address] [place], [place] [address]     Blood Alcohol level:  Lab Results  Component Value Date   [facility] <10 05/31/2021   ETH <5 03/20/2017    Metabolic Disorder Labs: No results found for: HGBA1C, MPG No results found for: PROLACTIN Lab Results  Component Value Date   CHOL 170 06/04/2021   TRIG 68 06/04/2021   HDL 41 06/04/2021   CHOLHDL 4.1 06/04/2021   VLDL 14 06/04/2021   LDLCALC 115 (H) 06/04/2021    Physical Findings: AIMS:  , ,  ,  ,    CIWA:    COWS:     Musculoskeletal: Strength & Muscle Tone: within normal limits Gait & Station: normal Patient leans: N/A  Psychiatric Specialty Exam:  Presentation  General Appearance: Disheveled  Eye Contact:Fair  Speech:Normal Rate  Speech Volume:Normal  Handedness:Right   Mood and Affect  Mood:Anxious  Affect:Congruent   Thought Process  Thought Processes:Goal Directed  Descriptions of Associations:Intact  Orientation:Full (Time, Place and Person)  Thought Content:Paranoid Ideation; Illogical; Delusions  History of Schizophrenia/Schizoaffective disorder:Yes  Duration of Psychotic Symptoms:Greater than six months  Hallucinations:Hallucinations: Auditory  Ideas of Reference:Paranoia; Percusatory  Suicidal Thoughts:Suicidal Thoughts: No  Homicidal Thoughts:Homicidal Thoughts: No   Sensorium  Memory:Immediate Fair; Recent Fair; Remote  Fair  Judgment:Impaired  Insight:Lacking   Executive Functions  Concentration:Fair  Attention Span:Fair  Recall:Fair  Fund of Knowledge:Fair  Language:Fair   Psychomotor Activity  Psychomotor Activity:Psychomotor Activity: Normal   Assets  Assets:Desire for Improvement;  Housing; Physical Health; Resilience; Social Support   Sleep  Sleep:Sleep: Poor    Physical Exam: Physical Exam Vitals and nursing note reviewed.  Constitutional:      Appearance: Normal appearance.  HENT:     Head: Normocephalic and atraumatic.     Mouth/Throat:     Pharynx: Oropharynx is clear.  Eyes:     Pupils: Pupils are equal, round, and reactive to light.  Cardiovascular:     Rate and Rhythm: Normal rate and regular rhythm.  Pulmonary:     Effort: Pulmonary effort is normal.     Breath sounds: Normal breath sounds.  Abdominal:     General: Abdomen is flat.     Palpations: Abdomen is soft.  Musculoskeletal:        General: Normal range of motion.  Skin:    General: Skin is warm and dry.  Neurological:     General: No focal deficit present.     Mental Status: She is alert. Mental status is at baseline.  Psychiatric:        Attention and Perception: She is inattentive.        Mood and Affect: Mood normal. Affect is blunt.        Speech: Speech is delayed.        Behavior: Behavior is slowed.        Thought Content: Thought content normal.        Cognition and Memory: Memory is impaired.   Review of Systems  Constitutional: Negative.   HENT: Negative.    Eyes: Negative.   Respiratory: Negative.    Cardiovascular: Negative.   Gastrointestinal: Negative.   Musculoskeletal:  Positive for back pain.  Skin: Negative.   Neurological: Negative.   Psychiatric/Behavioral:  Positive for memory loss. The patient is nervous/anxious.   Blood pressure (!) 147/97, pulse (!) 132, temperature (!) 97.5 F (36.4 C), temperature source Oral, resp. rate 18, height 4\' 9"  (1.448 m), weight  67.6 kg, SpO2 99 %. Body mass index is 32.24 kg/m.   Treatment Plan Summary: Medication management and Plan patient asked to have her "meds" adjusted to address her anxiety however she is very vague in discussing what sort of anxiety that is.  Also complaining of back pain.  No agitation and no report of suicidal ideation.  Still sees paranoid and with poor insight.  No change to medication for today.  Patient encouraged to talk about specific concerns with nursing.  , MD 06/04/2021, 12:44 PM

## 2021-06-04 NOTE — Progress Notes (Signed)
D: Pt alert and oriented. Pt rates depression 0/10, hopelessness 0/10, and anxiety 8/10. Pt goal: "Work on my anxiety." Pt reports energy level as normal and concentration as being good. Pt reports sleep last night as being poor. Pt did not receive medications. Pt denies experiencing any pain at this time. Pt denies experiencing any SI/HI, or AVH at this time.   Pt attended group today. Pt forwards little informations and interacts minimally.  A: Scheduled medications administered to pt, per MD orders. Support and encouragement provided. Frequent verbal contact made. Routine safety checks conducted q15 minutes.   R: No adverse drug reactions noted. Pt verbally contracts for safety at this time. Pt complaint with medications and treatment plan. Pt interacts minimally with others on the unit. Pt remains safe at this time. Will continue to monitor.

## 2021-06-05 NOTE — [facility] Group Notes (Signed)
LCSW Group Therapy Note  06/05/2021 2:41 PM  Type of Therapy and Topic:  Group Therapy:  Creating Healthy Boundaries  Participation Level:  Minimal   Description of Group:   Patients in this group will discuss how creating boundaries supports mental health wellness and sobriety. Participants will consider examples of both healthy and unhealthy boundaries and potential consequences of both. Participants are asked to share how they have implemented healthy boundaries in the past and encouraged to create new boundaries to implement in their lives post hospitalization.   Therapeutic Goals: Patient will identify two or more emotions that lead to a relapse for them Patient will identify two emotions that result when they relapse Patient will identify two emotions related to recovery Patient will demonstrate ability to communicate their needs through discussion and/or role plays   Summary of Patient Progress: Patient was present for the entirety of group session. Patient participated in opening and closing remarks. However, patient did not contribute at all to the topic of discussion despite encouraged participation.     Therapeutic Modalities:   Cognitive Behavioral Therapy Solution-Focused Therapy Assertiveness Training Relapse Prevention Therapy   Gwenevere Ghazi, MSW, [place], [place] 06/05/2021 2:41 PM

## 2021-06-05 NOTE — Progress Notes (Signed)
Patient alert and oriented x 4, affect is flat  thoughts are organized and coherent no bizarre behavior noted. Patient currently denies SI/HI/AVH and contracts for safety upon admission. 15 minutes safety checks maintained will continue to monitor.

## 2021-06-05 NOTE — Progress Notes (Signed)
Patient alert and oriented x 4, affect is flat  thoughts are organized and coherent no bizarre behavior noted. Patient currently denies SI/HI/AVH and contracts for safety upon admission. 15 minutes safety checks maintained will continue to monitor.         

## 2021-06-05 NOTE — Progress Notes (Signed)
[facility] MD Progress Note  06/05/2021 11:20 AM Tricia Kerr  MRN:  [id number] Subjective: Follow-up 35 year old woman with bipolar disorder.  Patient seen and chart reviewed.  Patient slept okay last night.  Still has limited insight not really able to understand what brought her into the hospital.  Does not spontaneously talk about problems at home but says she has not been in touch with her family and has no interest in doing so.  Not interacting very much on the ward.  Does seem to be compliant with medicine Principal Problem: Bipolar I disorder, single manic episode, severe, with psychosis (HCC) Diagnosis: Principal Problem:   Bipolar I disorder, single manic episode, severe, with psychosis (HCC) Active Problems:   Tobacco use disorder   Cannabis use disorder, severe, dependence (HCC)  Total Time spent with patient: 30 minutes  Past Psychiatric History: Past history of bipolar disorder  Past Medical History:  Past Medical History:  Diagnosis Date   Anxiety    Bipolar disorder (HCC)     Past Surgical History:  Procedure Laterality Date   APPENDECTOMY     TUBAL LIGATION     tubial     Family History: History reviewed. No pertinent family history. Family Psychiatric  History: See previous Social History:  Social History   Substance and Sexual Activity  Alcohol Use Yes     Social History   Substance and Sexual Activity  Drug Use Yes   Types: Marijuana   Comment: months    Social History   Socioeconomic History   Marital status: Divorced    Spouse name: Not on file   Number of children: Not on file   Years of education: Not on file   Highest education level: Not on file  Occupational History   Not on file  Tobacco Use   Smoking status: Every Day    Packs/day: 1.00    Pack years: 0.00    Types: Cigarettes   Smokeless tobacco: Never  Substance and Sexual Activity   Alcohol use: Yes   Drug use: Yes    Types: Marijuana    Comment: months   Sexual activity: Not  Currently  Other Topics Concern   Not on file  Social History Narrative   Not on file   Social Determinants of Health   Financial Resource Strain: Not on file  Food Insecurity: Not on file  Transportation Needs: Not on file  Physical Activity: Not on file  Stress: Not on file  Social Connections: Not on file   Additional Social History:                         Sleep: Fair  Appetite:  Fair  Current Medications: Current Facility-Administered Medications  Medication Dose Route Frequency Provider Last Rate Last Admin   acetaminophen (TYLENOL) tablet 1,000 mg  1,000 mg Oral Q6H PRN Gillermo Murdoch, NP       alum & mag hydroxide-simeth (MAALOX/MYLANTA) 200-200-20 MG/5ML suspension 30 mL  30 mL Oral Q4H PRN Gillermo Murdoch, NP       benztropine (COGENTIN) tablet 0.5 mg  0.5 mg Oral BID Gillermo Murdoch, NP   0.5 mg at 06/05/21 0830   ibuprofen (ADVIL) tablet 600 mg  600 mg Oral Q6H PRN Gillermo Murdoch, NP       magnesium hydroxide (MILK OF MAGNESIA) suspension 30 mL  30 mL Oral Daily PRN Gillermo Murdoch, NP       ondansetron (ZOFRAN-ODT) disintegrating tablet 4 mg  4 mg Oral Q8H PRN Gillermo Murdoch, NP       risperiDONE (RISPERDAL M-TABS) disintegrating tablet 2 mg  2 mg Oral BID Gillermo Murdoch, NP   2 mg at 06/04/21 1645    Lab Results:  Results for orders placed or performed during the hospital encounter of 06/02/21 (from the past 48 hour(s))  Lipid panel     Status: Abnormal   Collection Time: 06/04/21  7:21 AM  Result Value Ref Range   Cholesterol 170 0 - 200 mg/dL   Triglycerides 68 <213 mg/dL   HDL 41 >08 mg/dL   Total CHOL/HDL Ratio 4.1 RATIO   VLDL 14 0 - 40 mg/dL   LDL Cholesterol 657 (H) 0 - 99 mg/dL    Comment:        Total Cholesterol/HDL:CHD Risk Coronary Heart Disease Risk Table                     Men   Women  1/2 Average Risk   3.4   3.3  Average Risk       5.0   4.4  2 X Average Risk   9.6   7.1  3 X Average  Risk  23.4   11.0        Use the calculated Patient Ratio above and the CHD Risk Table to determine the patient's CHD Risk.        ATP III CLASSIFICATION (LDL):  <100     mg/dL   Optimal  846-962  mg/dL   Near or Above                    Optimal  130-159  mg/dL   Borderline  952-841  mg/dL   High  >324     mg/dL   Very High Performed at [facility], [address] [place], [place] [address]   Hemoglobin A1c     Status: None   Collection Time: 06/04/21  7:21 AM  Result Value Ref Range   Hgb A1c MFr Bld 5.5 4.8 - 5.6 %    Comment: (NOTE) Pre diabetes:          5.7%-6.4%  Diabetes:              >6.4%  Glycemic control for   <7.0% adults with diabetes    Mean Plasma Glucose 111.15 mg/dL    Comment: Performed at [facility], [address], [place], [place] [address]    Blood Alcohol level:  Lab Results  Component Value Date   [facility] <10 05/31/2021   ETH <5 03/20/2017    Metabolic Disorder Labs: Lab Results  Component Value Date   HGBA1C 5.5 06/04/2021   MPG 111.15 06/04/2021   No results found for: PROLACTIN Lab Results  Component Value Date   CHOL 170 06/04/2021   TRIG 68 06/04/2021   HDL 41 06/04/2021   CHOLHDL 4.1 06/04/2021   VLDL 14 06/04/2021   LDLCALC 115 (H) 06/04/2021    Physical Findings: AIMS:  , ,  ,  ,    CIWA:    COWS:     Musculoskeletal: Strength & Muscle Tone: within normal limits Gait & Station: normal Patient leans: N/A  Psychiatric Specialty Exam:  Presentation  General Appearance: Disheveled  Eye Contact:Fair  Speech:Normal Rate  Speech Volume:Normal  Handedness:Right   Mood and Affect  Mood:Anxious  Affect:Congruent   Thought Process  Thought Processes:Goal Directed  Descriptions of Associations:Intact  Orientation:Full (  Time, Place and Person)  Thought Content:Paranoid Ideation; Illogical; Delusions  History of Schizophrenia/Schizoaffective disorder:Yes  Duration of Psychotic  Symptoms:Greater than six months  Hallucinations:No data recorded Ideas of Reference:Paranoia; Percusatory  Suicidal Thoughts:No data recorded Homicidal Thoughts:No data recorded  Sensorium  Memory:Immediate Fair; Recent Fair; Remote Fair  Judgment:Impaired  Insight:Lacking   Executive Functions  Concentration:Fair  Attention Span:Fair  Recall:Fair  Fund of Knowledge:Fair  Language:Fair   Psychomotor Activity  Psychomotor Activity: No data recorded  Assets  Assets:Desire for Improvement; Housing; Physical Health; Resilience; Social Support   Sleep  Sleep: No data recorded   Physical Exam: Physical Exam Vitals and nursing note reviewed.  Constitutional:      Appearance: Normal appearance.  HENT:     Head: Normocephalic and atraumatic.     Mouth/Throat:     Pharynx: Oropharynx is clear.  Eyes:     Pupils: Pupils are equal, round, and reactive to light.  Cardiovascular:     Rate and Rhythm: Normal rate and regular rhythm.  Pulmonary:     Effort: Pulmonary effort is normal.     Breath sounds: Normal breath sounds.  Abdominal:     General: Abdomen is flat.     Palpations: Abdomen is soft.  Musculoskeletal:        General: Normal range of motion.  Skin:    General: Skin is warm and dry.  Neurological:     General: No focal deficit present.     Mental Status: She is alert. Mental status is at baseline.  Psychiatric:        Attention and Perception: She is inattentive.        Mood and Affect: Mood normal. Affect is blunt.        Speech: Speech is delayed.        Thought Content: Thought content normal.   Review of Systems  Constitutional: Negative.   HENT: Negative.    Eyes: Negative.   Respiratory: Negative.    Cardiovascular: Negative.   Gastrointestinal: Negative.   Musculoskeletal: Negative.   Skin: Negative.   Neurological: Negative.   Psychiatric/Behavioral:  Negative for depression, hallucinations, substance abuse and suicidal ideas.  The patient is not nervous/anxious.   Blood pressure 114/65, pulse (!) 138, temperature 99.1 F (37.3 C), temperature source Oral, resp. rate 18, height 4\' 9"  (1.448 m), weight 67.6 kg, SpO2 99 %. Body mass index is 32.24 kg/m.   Treatment Plan Summary: Medication management and Plan no change to medication management plan.  Reviewed treatment goals with patient and encouraged her to be up interacting with staff to give a better feel for how she is doing.  [place], MD 06/05/2021, 11:20 AM

## 2021-06-05 NOTE — Progress Notes (Signed)
Pt came in the med room crying stating she misses and is thinking about her kids. Writer gave support and encouragement. Pt went back to her room. Pt safe on the unit and q 15 minute safety checks.

## 2021-06-06 MED ORDER — ARIPIPRAZOLE 5 MG PO TABS
5.0000 mg | ORAL_TABLET | Freq: Every day | ORAL | Status: DC
  Administered 2021-06-07: 5 mg via ORAL
  Filled 2021-06-06: qty 1

## 2021-06-06 MED ORDER — RISPERIDONE 1 MG PO TBDP
2.0000 mg | ORAL_TABLET | Freq: Every day | ORAL | Status: DC
  Administered 2021-06-07: 2 mg via ORAL
  Filled 2021-06-06 (×2): qty 2

## 2021-06-06 NOTE — Progress Notes (Signed)
This writer went to go and check in on patient, and she is in bed asleep. It was reported to this writer that patient became tearful towards the end of day shift, stating that she misses her children and is ready to go home. Patient remains safe on the unit and will continue to be monitored.

## 2021-06-06 NOTE — Progress Notes (Signed)
Patient was given PRN pain medication for discomfort in her back. She was also provided with a snack and Gatorade to offset any stomach upset from taking Ibuprofen at this time of morning.

## 2021-06-06 NOTE — [facility] Group Notes (Signed)
LCSW Group Therapy Note   06/06/2021 12:39 PM  Type of Therapy and Topic:  Group Therapy:  Overcoming Obstacles   Participation Level:  Minimal   Description of Group:    In this group patients will be encouraged to explore what they see as obstacles to their own wellness and recovery. They will be guided to discuss their thoughts, feelings, and behaviors related to these obstacles. The group will process together ways to cope with barriers, with attention given to specific choices patients can make. Each patient will be challenged to identify changes they are motivated to make in order to overcome their obstacles. This group will be process-oriented, with patients participating in exploration of their own experiences as well as giving and receiving support and challenge from other group members.   Therapeutic Goals: Patient will identify personal and current obstacles as they relate to admission. Patient will identify barriers that currently interfere with their wellness or overcoming obstacles.  Patient will identify feelings, thought process and behaviors related to these barriers. Patient will identify two changes they are willing to make to overcome these obstacles:      Summary of Patient Progress Patient was present in group.  Patient did not engage in discussion.  Patient later shared that she is viewed as a "bad mom" by her children's father.  She reports that she is considered a bad mother because she takes time to "recharge my batteries".     Therapeutic Modalities:   Cognitive Behavioral Therapy Solution Focused Therapy Motivational Interviewing Relapse Prevention Therapy  Penni Homans, MSW, LCSW 06/06/2021 12:39 PM

## 2021-06-06 NOTE — Plan of Care (Signed)
D- Patient alert and oriented. Patient presents in an anxious, but pleasant mood on assessment requesting a new pair of scrubs from staff so that she could take a shower. Patient states that she has been dealing with back pain for a while, rating it an "8/10", and feels as if this is why she has difficulty sleeping. She also stated that nothing really eases the pain. Patient denies depression, but endorses anxiety, stating that "being here and not being able to see or talk to my kids" is why she's feeling this way. Patient also denies SI, HI, AVH, at this time. Patient's reported goals for today were "meds and discharge". Patient had no questions or concerns to voice to this Clinical research associate.  A- Support and encouragement provided. Routine safety checks conducted every 15 minutes. Patient informed to notify staff with problems or concerns.  R- Patient contracts for safety at this time. Patient compliant with treatment plan. Patient receptive, calm, and cooperative. Patient interacts well with others on the unit. Patient remains safe at this time.  Problem: Consults Goal: Concurrent Medical Patient Education Description: (See Patient Education Module for education specifics) Outcome: Progressing   Problem: [facility] Concurrent Medical Problem Goal: LTG-Pt will be physically stable and he/significant other Description: (Patient will be physically stable and he/significant other will be able to verbalize understanding of follow-up care and symptoms that would warrant further treatment) Outcome: Progressing Goal: STG-Vital signs will be within defined limits or stabilized Description: (STG- Vital signs will be within defined limits or stabilized for individual) Outcome: Progressing Goal: STG-Compliance with medication and/or treatment as ordered Description: (STG-Compliance with medication and/or treatment as ordered by MD) Outcome: Progressing Goal: STG-Verbalize two symptoms that would warrant  further Description: (STG-Verbalize two symptoms that would warrant further treatment) Outcome: Progressing Goal: STG-Patient will participate in management/stabilization Description: (STG-Patient will participate in management/stabilization of medical condition) Outcome: Progressing Goal: STG-Other (Specify): Description: STG-Other Concurrent Medical (Specify): Outcome: Progressing   Problem: Education: Goal: Utilization of techniques to improve thought processes will improve Outcome: Progressing Goal: Knowledge of the prescribed therapeutic regimen will improve Outcome: Progressing   Problem: Activity: Goal: Interest or engagement in leisure activities will improve Outcome: Progressing Goal: Imbalance in normal sleep/wake cycle will improve Outcome: Progressing   Problem: Coping: Goal: Coping ability will improve Outcome: Progressing Goal: Will verbalize feelings Outcome: Progressing   Problem: Health Behavior/Discharge Planning: Goal: Ability to make decisions will improve Outcome: Progressing Goal: Compliance with therapeutic regimen will improve Outcome: Progressing   Problem: Role Relationship: Goal: Will demonstrate positive changes in social behaviors and relationships Outcome: Progressing   Problem: Safety: Goal: Ability to disclose and discuss suicidal ideas will improve Outcome: Progressing Goal: Ability to identify and utilize support systems that promote safety will improve Outcome: Progressing   Problem: Self-Concept: Goal: Will verbalize positive feelings about self Outcome: Progressing Goal: Level of anxiety will decrease Outcome: Progressing

## 2021-06-06 NOTE — Plan of Care (Signed)
Patient was irritable and disorganized this morning. Appears calm and pleasant at this time. Patient stated that  she feels better at this time. Stated that she did not talk to any of her family today. Denies SI,HI and AVH. Appetite and energy level good. ADLs maintained. Visible in the milieu. Support and encouragement given.

## 2021-06-06 NOTE — Progress Notes (Signed)
Recreation Therapy Notes   Date: 06/06/2021  Time: 10:00 am  Location: Craft room    Behavioral response: Appropriate   Intervention Topic: Relaxation   Discussion/Intervention:  Group content today was focused on relaxation. The group defined relaxation and identified healthy ways to relax. Individuals expressed how much time they spend relaxing. Patients expressed how much their life would be if they did not make time for themselves to relax. The group stated ways they could improve their relaxation techniques in the future.  Individuals participated in the intervention "Time to Relax" where they had a chance to experience different relaxation techniques.  Clinical Observations/Feedback:  Patient came to group and was focused on what peers and staff had to say about relaxation. Individual was social with peers and staff while participating in the intervention.     Tricia Kerr LRT/CTRS         Lateef Juncaj 06/06/2021 11:38 AM
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
## 2021-06-06 NOTE — Progress Notes (Signed)
Patient is pleasant and cooperative. Has no QHS meds to take this evening.  Reports not sleeping well the night before, but did not want anything to aid in sleep. She denies SI/HI/AVH at this encounter. Also denies depression and anxiety.  She has been active in the day room watching TV and engages well with others on the unit.  She remains safe with Q 15 minute safety checks and was encouraged to come to staff with may concerns.    Cleo Butler-Nicholson, LPN

## 2021-06-07 MED ORDER — TRAZODONE HCL 50 MG PO TABS
50.0000 mg | ORAL_TABLET | Freq: Every evening | ORAL | Status: DC | PRN
  Administered 2021-06-07 – 2021-06-11 (×5): 50 mg via ORAL
  Filled 2021-06-07 (×5): qty 1

## 2021-06-07 MED ORDER — ARIPIPRAZOLE 10 MG PO TABS
10.0000 mg | ORAL_TABLET | Freq: Every day | ORAL | Status: DC
  Filled 2021-06-07: qty 1

## 2021-06-07 MED ORDER — LIDOCAINE 5 % EX PTCH
1.0000 | MEDICATED_PATCH | CUTANEOUS | Status: DC
  Administered 2021-06-07 – 2021-06-11 (×4): 1 via TRANSDERMAL
  Filled 2021-06-07 (×6): qty 1
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
## 2021-06-07 NOTE — Progress Notes (Signed)
Recreation Therapy Notes    Date: 06/07/2021  Time: 10:00 am  Location: Craft room    Behavioral response: Not Engaged   Intervention Topic: Goals    Discussion/Intervention:  Group content on today was focused on goals. Patients described what goals are and how they define goals. Individuals expressed how they go about setting goals and reaching them. The group identified how important goals are and if they make short term goals to reach long term goals. Patients described how many goals they work on at a time and what affects them not reaching their goal. Individuals described how much time they put into planning and obtaining their goals. The group participated in the intervention "My Goal Board" and made personal goal boards to help them achieve their goal Clinical Observations/Feedback:  Patient came to group and stared into space.  Ernestina Joe LRT/CTRS            Lamarr Feenstra 06/07/2021 11:18 AM
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
## 2021-06-07 NOTE — Plan of Care (Signed)
°  Problem: Group Participation °Goal: STG - Patient will engage in groups without prompting or encouragement from LRT x3 group sessions within 5 recreation therapy group sessions °Description: STG - Patient will engage in groups without prompting or encouragement from LRT x3 group sessions within 5 recreation therapy group sessions °Outcome: Progressing °  °

## 2021-06-07 NOTE — Plan of Care (Signed)
Pt rates anxiety 6/10. Pt denies SI, HI and AVH. Pt was educated on care plan and verbalizes understanding. Torrie Mayers RN Problem: Consults Goal: Concurrent Medical Patient Education Description: (See Patient Education Module for education specifics) Outcome: Progressing   Problem: [facility] Concurrent Medical Problem Goal: LTG-Pt will be physically stable and he/significant other Description: (Patient will be physically stable and he/significant other will be able to verbalize understanding of follow-up care and symptoms that would warrant further treatment) Outcome: Progressing Goal: STG-Vital signs will be within defined limits or stabilized Description: (STG- Vital signs will be within defined limits or stabilized for individual) Outcome: Progressing Goal: STG-Compliance with medication and/or treatment as ordered Description: (STG-Compliance with medication and/or treatment as ordered by MD) Outcome: Progressing Goal: STG-Verbalize two symptoms that would warrant further Description: (STG-Verbalize two symptoms that would warrant further treatment) Outcome: Progressing Goal: STG-Patient will participate in management/stabilization Description: (STG-Patient will participate in management/stabilization of medical condition) Outcome: Progressing Goal: STG-Other (Specify): Description: STG-Other Concurrent Medical (Specify): Outcome: Progressing   Problem: Education: Goal: Utilization of techniques to improve thought processes will improve Outcome: Progressing Goal: Knowledge of the prescribed therapeutic regimen will improve Outcome: Progressing   Problem: Activity: Goal: Interest or engagement in leisure activities will improve Outcome: Progressing Goal: Imbalance in normal sleep/wake cycle will improve Outcome: Progressing   Problem: Coping: Goal: Coping ability will improve Outcome: Progressing Goal: Will verbalize feelings Outcome: Progressing   Problem: Health  Behavior/Discharge Planning: Goal: Ability to make decisions will improve Outcome: Progressing Goal: Compliance with therapeutic regimen will improve Outcome: Progressing   Problem: Role Relationship: Goal: Will demonstrate positive changes in social behaviors and relationships Outcome: Progressing   Problem: Safety: Goal: Ability to disclose and discuss suicidal ideas will improve Outcome: Progressing Goal: Ability to identify and utilize support systems that promote safety will improve Outcome: Progressing   Problem: Self-Concept: Goal: Will verbalize positive feelings about self Outcome: Progressing Goal: Level of anxiety will decrease Outcome: Progressing   Problem: Coping: Goal: Ability to identify and develop effective coping behavior will improve Outcome: Progressing   Problem: Self-Concept: Goal: Level of anxiety will decrease Outcome: Progressing

## 2021-06-07 NOTE — Plan of Care (Signed)
  Problem: Consults Goal: Concurrent Medical Patient Education Description: (See Patient Education Module for education specifics) Outcome: Progressing   Problem: [facility] Concurrent Medical Problem Goal: LTG-Pt will be physically stable and he/significant other Description: (Patient will be physically stable and he/significant other will be able to verbalize understanding of follow-up care and symptoms that would warrant further treatment) Outcome: Progressing Goal: STG-Vital signs will be within defined limits or stabilized Description: (STG- Vital signs will be within defined limits or stabilized for individual) Outcome: Progressing Goal: STG-Compliance with medication and/or treatment as ordered Description: (STG-Compliance with medication and/or treatment as ordered by MD) Outcome: Progressing Goal: STG-Verbalize two symptoms that would warrant further Description: (STG-Verbalize two symptoms that would warrant further treatment) Outcome: Progressing Goal: STG-Patient will participate in management/stabilization Description: (STG-Patient will participate in management/stabilization of medical condition) Outcome: Progressing Goal: STG-Other (Specify): Description: STG-Other Concurrent Medical (Specify): Outcome: Progressing   Problem: Education: Goal: Utilization of techniques to improve thought processes will improve Outcome: Progressing Goal: Knowledge of the prescribed therapeutic regimen will improve Outcome: Progressing   Problem: Activity: Goal: Interest or engagement in leisure activities will improve Outcome: Progressing Goal: Imbalance in normal sleep/wake cycle will improve Outcome: Progressing   Problem: Coping: Goal: Coping ability will improve Outcome: Progressing Goal: Will verbalize feelings Outcome: Progressing   Problem: Health Behavior/Discharge Planning: Goal: Ability to make decisions will improve Outcome: Progressing Goal: Compliance with therapeutic  regimen will improve Outcome: Progressing   Problem: Safety: Goal: Ability to disclose and discuss suicidal ideas will improve Outcome: Progressing   Problem: Safety: Goal: Ability to disclose and discuss suicidal ideas will improve Outcome: Progressing Goal: Ability to identify and utilize support systems that promote safety will improve Outcome: Progressing   Problem: Self-Concept: Goal: Will verbalize positive feelings about self Outcome: Progressing Goal: Level of anxiety will decrease Outcome: Progressing

## 2021-06-07 NOTE — [facility] Group Notes (Signed)
[facility] Group Notes:  (Nursing/MHT/Case Management/Adjunct)  Date:  06/07/2021  Time:  9:41 PM  Type of Therapy:  Group Therapy  Participation Level:  Active  Participation Quality:  Appropriate  Affect:  Appropriate  Cognitive:  Alert  Insight:  Good  Engagement in Group:  Engaged and wanted to figure out her medicine.  Modes of Intervention:  Support  Summary of Progress/Problems:  Tricia Kerr 06/07/2021, 9:41 PM

## 2021-06-08 MED ORDER — OLANZAPINE 10 MG IM SOLR: 10.0000 mg | Freq: Every day | INTRAMUSCULAR | Status: DC

## 2021-06-08 MED ORDER — OLANZAPINE 10 MG PO TABS
10.0000 mg | ORAL_TABLET | Freq: Every day | ORAL | Status: DC
  Administered 2021-06-08 – 2021-06-10 (×3): 10 mg via ORAL
  Filled 2021-06-08 (×3): qty 1
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
## 2021-06-08 NOTE — Plan of Care (Signed)
Patient states " I feel little relaxed now." Patient visible in the milieu. Appropriate with peers. Personal hygiene maintained. Took PM medications. Support and encouragement given.
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
## 2021-06-08 NOTE — Progress Notes (Signed)
Patient alert and oriented x 4, affect is flat  thoughts are organized and coherent no bizarre behavior noted, she was receptive to staff, complaint with medication regimen and interacting appropriately with peers and staff. Patient currently denies SI/HI/AVH and contracts for safety upon admission. 15 minutes safety checks maintained will continue to monitor

## 2021-06-08 NOTE — Progress Notes (Signed)
[facility] Second Physician Opinion Progress Note for Medication Administration to Non-consenting Patients (For Involuntarily Committed Patients)  Patient: Tricia Kerr Date of Birth: [birth year] MRN: [id number]  Reason for the Medication: The patient, without the benefit of the specific treatment measure, is incapable of participating in any available treatment plan that will give the patient a realistic opportunity of improving the patient's condition. There is, without the benefit of the specific treatment measure, a significant possibility that the patient will harm self or others before improvement of the patient's condition is realized.  Consideration of Side Effects: Consideration of the side effects related to the medication plan has been given.  Rationale for Medication Administration: Patient suffers from a psychotic disorder which is causing her to have delusional thinking bizarre behavior and inappropriate agitation.  Her behavior is disruptive and dangerous to herself.  This condition is unlikely to improve without appropriate medication which the patient is currently refusing due to her own psychosis.    Mordecai Rasmussen, MD 06/08/21  1:59 PM   This documentation is good for (7) seven days from the date of the MD signature. New documentation must be completed every seven (7) days with detailed justification in the medical record if the patient requires continued non-emergent administration of psychotropic medications.

## 2021-06-08 NOTE — Progress Notes (Signed)
"[facility] MD Progress Note  06/08/2021 1:50 PM Tricia Kerr  MRN:  [id number]  CC "When can we talk about discharge planning."  Subjective:  35 year old female presenting for worsening psychosis.  No acute events overnight, attending to ADLs. Now refusing medications.  Yesterday evening patient was observed yelling loudly on the phone.  When confronted about this she notes she was yelling at [place] because he was using technology to jerk her body everywhere and cause her pain.  This morning she was quite hostile and angry on exam.  She demands to know when she could be discharged home.  She continues to have paranoid ideations that [place] is forcing her to be in the hospital and controlling her with technology.  She also remains convinced that her trailer is contaminated with uranium.  She also accuses me of trying to kill her and that I am colluding with [place].  There is some more paranoid ideations surrounding her mother that I could not quite follow.  She continues to insist that she does not have a mental illness and that people have been wrongly hospitalizing her off and on since she was a child.  Attempts were made once again to discuss her diagnosis of bipolar disorder and to discuss her symptoms that she is displaying.  Patient becomes very irate and stormed off after this.  Patient is exhibiting a level of psychosis that is endangering herself as well as her family.  This is highly unlikely to improve without the use of medications.  That is my opinion that she would benefit from nonemergent forced medications.  We will reach out to Dr. Toni Amend for a second opinion.    Principal Problem: Bipolar I disorder, single manic episode, severe, with psychosis (HCC) Diagnosis: Principal Problem:   Bipolar I disorder, single manic episode, severe, with psychosis (HCC) Active Problems:   Tobacco use disorder   Cannabis use disorder, severe, dependence (HCC)  Total Time spent with patient: 30 minutes  Past  Psychiatric History: See H&P  Past Medical History:  Past Medical History:  Diagnosis Date   Anxiety    Bipolar disorder (HCC)     Past Surgical History:  Procedure Laterality Date   APPENDECTOMY     TUBAL LIGATION     tubial     Family History: History reviewed. No pertinent family history. Family Psychiatric  History: See H&P Social History:  Social History   Substance and Sexual Activity  Alcohol Use Yes     Social History   Substance and Sexual Activity  Drug Use Yes   Types: Marijuana   Comment: months    Social History   Socioeconomic History   Marital status: Divorced    Spouse name: Not on file   Number of children: Not on file   Years of education: Not on file   Highest education level: Not on file  Occupational History   Not on file  Tobacco Use   Smoking status: Every Day    Packs/day: 1.00    Pack years: 0.00    Types: Cigarettes   Smokeless tobacco: Never  Substance and Sexual Activity   Alcohol use: Yes   Drug use: Yes    Types: Marijuana    Comment: months   Sexual activity: Not Currently  Other Topics Concern   Not on file  Social History Narrative   Not on file   Social Determinants of Health   Financial Resource Strain: Not on file  Food Insecurity: Not on file  Transportation Needs: Not on file  Physical Activity: Not on file  Stress: Not on file  Social Connections: Not on file   Additional Social History:                         Sleep: Fair  Appetite:  Fair  Current Medications: Current Facility-Administered Medications  Medication Dose Route Frequency Provider Last Rate Last Admin   acetaminophen (TYLENOL) tablet 1,000 mg  1,000 mg Oral Q6H PRN Gillermo Murdoch, NP   1,000 mg at 06/07/21 2247   alum & mag hydroxide-simeth (MAALOX/MYLANTA) 200-200-20 MG/5ML suspension 30 mL  30 mL Oral Q4H PRN Gillermo Murdoch, NP       ARIPiprazole (ABILIFY) tablet 10 mg  10 mg Oral Daily Les Pou M, MD        benztropine (COGENTIN) tablet 0.5 mg  0.5 mg Oral BID Gillermo Murdoch, NP   0.5 mg at 06/07/21 1650   ibuprofen (ADVIL) tablet 600 mg  600 mg Oral Q6H PRN Gillermo Murdoch, NP   600 mg at 06/06/21 0414   lidocaine (LIDODERM) 5 % 1 patch  1 patch Transdermal Q24H Jesse Sans, MD   1 patch at 06/07/21 1257   magnesium hydroxide (MILK OF MAGNESIA) suspension 30 mL  30 mL Oral Daily PRN Gillermo Murdoch, NP       ondansetron (ZOFRAN-ODT) disintegrating tablet 4 mg  4 mg Oral Q8H PRN Gillermo Murdoch, NP   4 mg at 06/07/21 0008   risperiDONE (RISPERDAL M-TABS) disintegrating tablet 2 mg  2 mg Oral QHS Jesse Sans, MD   2 mg at 06/07/21 2100   traZODone (DESYREL) tablet 50 mg  50 mg Oral QHS PRN Gillermo Murdoch, NP   50 mg at 06/07/21 2247    Lab Results: No results found for this or any previous visit (from the past 48 hour(s)).  Blood Alcohol level:  Lab Results  Component Value Date   ETH <10 05/31/2021   ETH <5 03/20/2017    Metabolic Disorder Labs: Lab Results  Component Value Date   HGBA1C 5.5 06/04/2021   MPG 111.15 06/04/2021   No results found for: PROLACTIN Lab Results  Component Value Date   CHOL 170 06/04/2021   TRIG 68 06/04/2021   HDL 41 06/04/2021   CHOLHDL 4.1 06/04/2021   VLDL 14 06/04/2021   LDLCALC 115 (H) 06/04/2021    Physical Findings: AIMS:  , ,  ,  ,    CIWA:    COWS:     Musculoskeletal: Strength & Muscle Tone: within normal limits Gait & Station: normal Patient leans: N/A  Psychiatric Specialty Exam:  Presentation  General Appearance: Disheveled  Eye Contact:Minimal  Speech:Slow  Speech Volume:Increased  Handedness:Right   Mood and Affect  Mood:Angry  Affect:Congruent   Thought Process  Thought Processes:Disorganized  Descriptions of Associations:Loose  Orientation:Full (Time, Place and Person)  Thought Content:Paranoid Ideation; Delusions; Scattered  History of Schizophrenia/Schizoaffective  disorder:Yes  Duration of Psychotic Symptoms:Greater than six months  Hallucinations:Hallucinations: Tactile  Ideas of Reference:Paranoia; Percusatory  Suicidal Thoughts:Suicidal Thoughts: No  Homicidal Thoughts:Homicidal Thoughts: No   Sensorium  Memory:Immediate Fair; Recent Fair; Remote Fair  Judgment:Impaired  Insight:Lacking   Executive Functions  Concentration:Poor  Attention Span:Poor  Recall:Poor  Fund of Knowledge:Fair  Language:Fair   Psychomotor Activity  Psychomotor Activity:Psychomotor Activity: Restlessness   Assets  Assets:Housing; Social Support   Sleep  Sleep:Sleep: Fair Number of Hours of Sleep: 5.75    Physical Exam:  Physical Exam ROS Blood pressure (!) 156/99, pulse 85, temperature 97.9 F (36.6 C), temperature source Oral, resp. rate 17, height 4\' 9"  (1.448 m), weight 67.6 kg, SpO2 99 %. Body mass index is 32.24 kg/m.   Treatment Plan Summary: Daily contact with patient to assess and evaluate symptoms and progress in treatment and Medication management 35 year old female presenting with acute psychosis and mania.patient is now refusing medications.  She continues to exhibit a level of psychosis that is endangering herself and her family.  This level psychosis is unlikely to improve without medications.  It is my opinion that she would benefit from nonemergent forced medications.  We will seek a second opinion from Dr. 32.  If he is in agreement, we will start forced Zyprexa 10 mg p.o. or IM.  Toni Amend, MD 06/08/2021, 1:50 PM

## 2021-06-08 NOTE — Tx Team (Signed)
Interdisciplinary Treatment and Diagnostic Plan Update  06/08/2021 Time of Session: 8:30AM Tricia Kerr MRN: [id number]  Principal Diagnosis: Bipolar I disorder, single manic episode, severe, with psychosis (HCC)  Secondary Diagnoses: Principal Problem:   Bipolar I disorder, single manic episode, severe, with psychosis (HCC) Active Problems:   Tobacco use disorder   Cannabis use disorder, severe, dependence (HCC)   Current Medications:  Current Facility-Administered Medications  Medication Dose Route Frequency Provider Last Rate Last Admin   acetaminophen (TYLENOL) tablet 1,000 mg  1,000 mg Oral Q6H PRN Gillermo Murdoch, NP   1,000 mg at 06/07/21 2247   alum & mag hydroxide-simeth (MAALOX/MYLANTA) 200-200-20 MG/5ML suspension 30 mL  30 mL Oral Q4H PRN Gillermo Murdoch, NP       ARIPiprazole (ABILIFY) tablet 10 mg  10 mg Oral Daily Les Pou M, MD       benztropine (COGENTIN) tablet 0.5 mg  0.5 mg Oral BID Gillermo Murdoch, NP   0.5 mg at 06/07/21 1650   ibuprofen (ADVIL) tablet 600 mg  600 mg Oral Q6H PRN Gillermo Murdoch, NP   600 mg at 06/06/21 0414   lidocaine (LIDODERM) 5 % 1 patch  1 patch Transdermal Q24H Jesse Sans, MD   1 patch at 06/07/21 1257   magnesium hydroxide (MILK OF MAGNESIA) suspension 30 mL  30 mL Oral Daily PRN Gillermo Murdoch, NP       ondansetron (ZOFRAN-ODT) disintegrating tablet 4 mg  4 mg Oral Q8H PRN Gillermo Murdoch, NP   4 mg at 06/07/21 0008   risperiDONE (RISPERDAL M-TABS) disintegrating tablet 2 mg  2 mg Oral QHS Jesse Sans, MD   2 mg at 06/07/21 2100   traZODone (DESYREL) tablet 50 mg  50 mg Oral QHS PRN Gillermo Murdoch, NP   50 mg at 06/07/21 2247   PTA Medications: No medications prior to admission.    Patient Stressors: Financial difficulties Marital or family conflict Substance abuse  Patient Strengths: Motivation for treatment/growth Supportive family/friends  Treatment Modalities: Medication  Management, Group therapy, Case management,  1 to 1 session with clinician, Psychoeducation, Recreational therapy.   Physician Treatment Plan for Primary Diagnosis: Bipolar I disorder, single manic episode, severe, with psychosis (HCC) Long Term Goal(s): Improvement in symptoms so as ready for discharge   Short Term Goals: Ability to identify changes in lifestyle to reduce recurrence of condition will improve Ability to verbalize feelings will improve Ability to disclose and discuss suicidal ideas Ability to demonstrate self-control will improve Ability to identify and develop effective coping behaviors will improve Ability to maintain clinical measurements within normal limits will improve Compliance with prescribed medications will improve Ability to identify triggers associated with substance abuse/mental health issues will improve  Medication Management: Evaluate patient's response, side effects, and tolerance of medication regimen.  Therapeutic Interventions: 1 to 1 sessions, Unit Group sessions and Medication administration.  Evaluation of Outcomes: Not Progressing  Physician Treatment Plan for Secondary Diagnosis: Principal Problem:   Bipolar I disorder, single manic episode, severe, with psychosis (HCC) Active Problems:   Tobacco use disorder   Cannabis use disorder, severe, dependence (HCC)  Long Term Goal(s): Improvement in symptoms so as ready for discharge   Short Term Goals: Ability to identify changes in lifestyle to reduce recurrence of condition will improve Ability to verbalize feelings will improve Ability to disclose and discuss suicidal ideas Ability to demonstrate self-control will improve Ability to identify and develop effective coping behaviors will improve Ability to maintain clinical measurements within normal limits  will improve Compliance with prescribed medications will improve Ability to identify triggers associated with substance abuse/mental health  issues will improve     Medication Management: Evaluate patient's response, side effects, and tolerance of medication regimen.  Therapeutic Interventions: 1 to 1 sessions, Unit Group sessions and Medication administration.  Evaluation of Outcomes: Not Progressing   RN Treatment Plan for Primary Diagnosis: Bipolar I disorder, single manic episode, severe, with psychosis (HCC) Long Term Goal(s): Knowledge of disease and therapeutic regimen to maintain health will improve  Short Term Goals: Ability to remain free from injury will improve, Ability to verbalize frustration and anger appropriately will improve, Ability to demonstrate self-control, Ability to participate in decision making will improve, Ability to verbalize feelings will improve, Ability to disclose and discuss suicidal ideas, Ability to identify and develop effective coping behaviors will improve, and Compliance with prescribed medications will improve  Medication Management: RN will administer medications as ordered by provider, will assess and evaluate patient's response and provide education to patient for prescribed medication. RN will report any adverse and/or side effects to prescribing provider.  Therapeutic Interventions: 1 on 1 counseling sessions, Psychoeducation, Medication administration, Evaluate responses to treatment, Monitor vital signs and CBGs as ordered, Perform/monitor CIWA, COWS, AIMS and Fall Risk screenings as ordered, Perform wound care treatments as ordered.  Evaluation of Outcomes: Not Progressing   LCSW Treatment Plan for Primary Diagnosis: Bipolar I disorder, single manic episode, severe, with psychosis (HCC) Long Term Goal(s): Safe transition to appropriate next level of care at discharge, Engage patient in therapeutic group addressing interpersonal concerns.  Short Term Goals: Engage patient in aftercare planning with referrals and resources, Increase social support, Increase ability to appropriately  verbalize feelings, Increase emotional regulation, Facilitate acceptance of mental health diagnosis and concerns, Facilitate patient progression through stages of change regarding substance use diagnoses and concerns, Identify triggers associated with mental health/substance abuse issues, and Increase skills for wellness and recovery  Therapeutic Interventions: Assess for all discharge needs, 1 to 1 time with Social worker, Explore available resources and support systems, Assess for adequacy in community support network, Educate family and significant other(s) on suicide prevention, Complete Psychosocial Assessment, Interpersonal group therapy.  Evaluation of Outcomes: Not Progressing   Progress in Treatment: Attending groups: Yes. Participating in groups: Yes. Taking medication as prescribed: No. Toleration medication: No. Family/Significant other contact made: No, will contact:  Patient declined consent to reach collateral contact.  Patient understands diagnosis: Yes. Discussing patient identified problems/goals with staff: Yes. Medical problems stabilized or resolved: No. Denies suicidal/homicidal ideation: Yes. Issues/concerns per patient self-inventory: Yes. Other: none   New problem(s) identified: Yes, Describe:  Patient admitted for medication stabilization   New Short Term/Long Term Goal(s): detox, elimination of symptoms of psychosis, medication management for mood stabilization; elimination of SI thoughts; development of comprehensive mental wellness/sobriety plan. Update 06/08/21: No changes at this time.   Patient Goals:  "start working witting more . . . being more independent and stop relying on people" Update 06/08/21: No changes at this time.   Discharge Plan or Barriers: No barriers foreseen at this time. Update 06/08/21: No changes at this time.  Reason for Continuation of Hospitalization: Anxiety Delusions  Depression Medication stabilization Withdrawal  symptoms  Estimated Length of Stay: TBD  Attendees: Patient:  06/08/2021 9:40 AM  Physician: Les Pou, MD 06/08/2021 9:40 AM  Nursing: 06/08/2021 9:40 AM  RN Care Manager:  06/08/2021 9:40 AM  Social Worker: Gwenevere Ghazi, MSW, [place], LCASA  06/08/2021 9:40 AM  Recreational Therapist:  06/08/2021 9:40 AM  Other: Jillyn Hidden, MSW, LCSW, LCAS  06/08/2021 9:40 AM  Other: Penni Homans, MSW, LCSW 06/08/2021 9:40 AM  Other: 06/08/2021 9:40 AM    Scribe for Treatment Team: Glenis Smoker, LCSW 06/08/2021 9:40 AM

## 2021-06-08 NOTE — Progress Notes (Signed)
Recreation Therapy Notes   Date: 06/08/2021  Time: 9:30 am   Location: Court yard   [employer] response: Not Engaged   Intervention Topic: Social Skills   Discussion/Intervention:  Group content on today was focused on social skills. The group defined social skills and identified ways they use social skills. Patients expressed what obstacles they face when trying to be social. Participants described the importance of social skills. The group listed ways to improve social skills and reasons to improve social skills. Individuals had an opportunity to learn new and improve social skills as well as identify their weaknesses. Clinical Observations/Feedback: Patient came to group late and was isolated to herself. Participant left early and returned to her room.  Koby Hartfield LRT/CTRS         Natara Monfort 06/08/2021 10:59 AM

## 2021-06-09 NOTE — Progress Notes (Signed)
Patient is seen in the milieu during snack time. She appears disheveled and sullen. She is medication compliant. She complained of lower back pain 5/10 which was relieved by Tylenol. She also said she was nauseas and was given Zofran which was effective. Patient received Trazodone before bed and it was effective. She denies SI, HI, and AVH. Will monitor with 15 minute safety checks.

## 2021-06-09 NOTE — Progress Notes (Signed)
This patient has been in the milieu during meals and then returned to her bedroom. When speaking with the patient she denies SI/HI AVH. She has been med compliant for the shift. No complaints of pain. Will continue to monitor and provide support.   Angelina Sheriff, RN

## 2021-06-09 NOTE — Progress Notes (Signed)
"[facility] MD Progress Note  06/09/2021 11:33 AM Tricia Kerr  MRN:  [id number]  CC "I'm okay"  Subjective:  35 year old female presenting for worsening psychosis.  No acute events overnight, attending to ADLs, took oral medications overnight. Patient seen one-on-one again today. She is still somewhat irritable, but cooperative on exam. She notes she did sleep well overnight, and did not have any further episodes of nausea or vomiting. She does not freely volunteer any delusional content today. Will continue to monitor.     Principal Problem: Bipolar I disorder, single manic episode, severe, with psychosis (HCC) Diagnosis: Principal Problem:   Bipolar I disorder, single manic episode, severe, with psychosis (HCC) Active Problems:   Tobacco use disorder   Cannabis use disorder, severe, dependence (HCC)  Total Time spent with patient: 30 minutes  Past Psychiatric History: See H&P  Past Medical History:  Past Medical History:  Diagnosis Date   Anxiety    Bipolar disorder (HCC)     Past Surgical History:  Procedure Laterality Date   APPENDECTOMY     TUBAL LIGATION     tubial     Family History: History reviewed. No pertinent family history. Family Psychiatric  History: See H&P Social History:  Social History   Substance and Sexual Activity  Alcohol Use Yes     Social History   Substance and Sexual Activity  Drug Use Yes   Types: Marijuana   Comment: months    Social History   Socioeconomic History   Marital status: Divorced    Spouse name: Not on file   Number of children: Not on file   Years of education: Not on file   Highest education level: Not on file  Occupational History   Not on file  Tobacco Use   Smoking status: Every Day    Packs/day: 1.00    Pack years: 0.00    Types: Cigarettes   Smokeless tobacco: Never  Substance and Sexual Activity   Alcohol use: Yes   Drug use: Yes    Types: Marijuana    Comment: months   Sexual activity: Not Currently   Other Topics Concern   Not on file  Social History Narrative   Not on file   Social Determinants of Health   Financial Resource Strain: Not on file  Food Insecurity: Not on file  Transportation Needs: Not on file  Physical Activity: Not on file  Stress: Not on file  Social Connections: Not on file   Additional Social History:                         Sleep: Fair  Appetite:  Fair  Current Medications: Current Facility-Administered Medications  Medication Dose Route Frequency Provider Last Rate Last Admin   acetaminophen (TYLENOL) tablet 1,000 mg  1,000 mg Oral Q6H PRN Gillermo Murdoch, NP   1,000 mg at 06/09/21 0651   alum & mag hydroxide-simeth (MAALOX/MYLANTA) 200-200-20 MG/5ML suspension 30 mL  30 mL Oral Q4H PRN Gillermo Murdoch, NP       benztropine (COGENTIN) tablet 0.5 mg  0.5 mg Oral BID Gillermo Murdoch, NP   0.5 mg at 06/09/21 0830   ibuprofen (ADVIL) tablet 600 mg  600 mg Oral Q6H PRN Gillermo Murdoch, NP   600 mg at 06/06/21 0414   lidocaine (LIDODERM) 5 % 1 patch  1 patch Transdermal Q24H Jesse Sans, MD   1 patch at 06/08/21 1408   magnesium hydroxide (MILK OF MAGNESIA) suspension 30  mL  30 mL Oral Daily PRN Gillermo Murdoch, NP       OLANZapine (ZYPREXA) tablet 10 mg  10 mg Oral QHS Jesse Sans, MD   10 mg at 06/08/21 2149   Or   OLANZapine (ZYPREXA) injection 10 mg  10 mg Intramuscular QHS Jesse Sans, MD       ondansetron (ZOFRAN-ODT) disintegrating tablet 4 mg  4 mg Oral Q8H PRN Gillermo Murdoch, NP   4 mg at 06/07/21 0008   traZODone (DESYREL) tablet 50 mg  50 mg Oral QHS PRN Gillermo Murdoch, NP   50 mg at 06/08/21 2149    Lab Results: No results found for this or any previous visit (from the past 48 hour(s)).  Blood Alcohol level:  Lab Results  Component Value Date   ETH <10 05/31/2021   ETH <5 03/20/2017    Metabolic Disorder Labs: Lab Results  Component Value Date   HGBA1C 5.5 06/04/2021    MPG 111.15 06/04/2021   No results found for: PROLACTIN Lab Results  Component Value Date   CHOL 170 06/04/2021   TRIG 68 06/04/2021   HDL 41 06/04/2021   CHOLHDL 4.1 06/04/2021   VLDL 14 06/04/2021   LDLCALC 115 (H) 06/04/2021    Physical Findings: AIMS:  , ,  ,  ,    CIWA:    COWS:     Musculoskeletal: Strength & Muscle Tone: within normal limits Gait & Station: normal Patient leans: N/A  Psychiatric Specialty Exam:  Presentation  General Appearance: Casual Eye Contact:Fair Speech:Slow  Speech Volume:Increased  Handedness:Right   Mood and Affect  Mood:Irritable Affect:Congruent   Thought Process  Thought Processes:Disorganized  Descriptions of Associations:Loose  Orientation:Full (Time, Place and Person)  Thought Content:Paranoid Ideation; Delusions; Scattered  History of Schizophrenia/Schizoaffective disorder:Yes  Duration of Psychotic Symptoms:Greater than six months  Hallucinations:Denies Ideas of Reference:Paranoia; Percusatory  Suicidal Thoughts:Suicidal Thoughts: No  Homicidal Thoughts:Homicidal Thoughts: No   Sensorium  Memory:Immediate Fair; Recent Fair; Remote Fair  Judgment:Impaired  Insight:Lacking   Executive Functions  Concentration:Poor  Attention Span:Poor  Recall:Poor  Fund of Knowledge:Fair  Language:Fair   Psychomotor Activity  Psychomotor Activity:Psychomotor Activity: Restlessness   Assets  Assets:Housing; Social Support   Sleep  Sleep:Good, 7.5 hours   Physical Exam: Physical Exam ROS Blood pressure 122/85, pulse 89, temperature 98.2 F (36.8 C), temperature source Oral, resp. rate 17, height 4\' 9"  (1.448 m), weight 67.6 kg, SpO2 99 %. Body mass index is 32.24 kg/m.   Treatment Plan Summary: Daily contact with patient to assess and evaluate symptoms and progress in treatment and Medication management 35 year old female presenting with acute psychosis and mania. Patient irritable on exam, but  more cooperative today. She does not freely discuss delusional content today, but continues to appear guarded and paranoid. Nausea improved. Continue non-emergent forced Zyprexa 10 mg p.o. or IM.  32, MD 06/09/2021, 11:33 AM

## 2021-06-09 NOTE — Progress Notes (Signed)
Patient is pleasant and cooperative.  She is active on the unit and engages well with other patients. She is med compliant and received her medication without incident.  She admits to having an altercation with the doctor earlier in the day, but had since had time to reflect. Reports wanting to get better and do the things she needs to do. She denies SI/HI/AVH depression, but does continue to endorse some mild anxiety. She also has back pain that is being suppressed with the aid of pain patch.  Will continue to monitor with  Q15 minute safety checks and encourage her to come to staff with any concerns.    Cleo Butler-Nicholson, LPN

## 2021-06-09 NOTE — Plan of Care (Signed)
  Problem: Consults Goal: Concurrent Medical Patient Education Description: (See Patient Education Module for education specifics) Outcome: Progressing   Problem: Activity: Goal: Interest or engagement in leisure activities will improve Outcome: Progressing Goal: Imbalance in normal sleep/wake cycle will improve Outcome: Progressing   Problem: Coping: Goal: Coping ability will improve 06/09/2021 0031 by Butler-Nicholson, Reneshia Zuccaro L, LPN Outcome: Progressing 06/09/2021 0028 by Butler-Nicholson, Esteban Kobashigawa L, LPN Outcome: Progressing Goal: Will verbalize feelings 06/09/2021 0031 by Butler-Nicholson, Adaline Trejos L, LPN Outcome: Progressing 06/09/2021 0028 by Butler-Nicholson, Dajahnae Vondra L, LPN Outcome: Progressing   Problem: Health Behavior/Discharge Planning: Goal: Ability to make decisions will improve 06/09/2021 0031 by Butler-Nicholson, Marx Doig L, LPN Outcome: Progressing 06/09/2021 0028 by Butler-Nicholson, Herbie Lehrmann L, LPN Outcome: Progressing Goal: Compliance with therapeutic regimen will improve 06/09/2021 0031 by Butler-Nicholson, Kelin Borum L, LPN Outcome: Progressing 06/09/2021 0028 by Butler-Nicholson, Donivan Thammavong L, LPN Outcome: Progressing

## 2021-06-09 NOTE — [facility] Group Notes (Signed)
LCSW Group Therapy Note  06/09/2021 12:29 PM  Type of Therapy/Topic:  Group Therapy:  Balance in Life  Participation Level:  Did Not Attend  Description of Group:    This group will address the concept of balance and how it feels and looks when one is unbalanced. Patients will be encouraged to process areas in their lives that are out of balance and identify reasons for remaining unbalanced. Facilitators will guide patients in utilizing problem-solving interventions to address and correct the stressor making their life unbalanced. Understanding and applying boundaries will be explored and addressed for obtaining and maintaining a balanced life. Patients will be encouraged to explore ways to assertively make their unbalanced needs known to significant others in their lives, using other group members and facilitator for support and feedback.  Therapeutic Goals: Patient will identify two or more emotions or situations they have that consume much of in their lives. Patient will identify signs/triggers that life has become out of balance:  Patient will identify two ways to set boundaries in order to achieve balance in their lives:  Patient will demonstrate ability to communicate their needs through discussion and/or role plays  Summary of Patient Progress:  Patient given the opportunity to attend group, however, was asleep.      Therapeutic Modalities:   Cognitive Behavioral Therapy Solution-Focused Therapy Assertiveness Training  Penni Homans MSW, LCSW 06/09/2021 12:29 PM

## 2021-06-09 NOTE — Plan of Care (Signed)
  Problem: Consults Goal: Concurrent Medical Patient Education Description: (See Patient Education Module for education specifics) Outcome: Progressing   Problem: [facility] Concurrent Medical Problem Goal: LTG-Pt will be physically stable and he/significant other Description: (Patient will be physically stable and he/significant other will be able to verbalize understanding of follow-up care and symptoms that would warrant further treatment) Outcome: Progressing Goal: STG-Vital signs will be within defined limits or stabilized Description: (STG- Vital signs will be within defined limits or stabilized for individual) Outcome: Progressing Goal: STG-Compliance with medication and/or treatment as ordered Description: (STG-Compliance with medication and/or treatment as ordered by MD) Outcome: Progressing Goal: STG-Verbalize two symptoms that would warrant further Description: (STG-Verbalize two symptoms that would warrant further treatment) Outcome: Progressing Goal: STG-Patient will participate in management/stabilization Description: (STG-Patient will participate in management/stabilization of medical condition) Outcome: Progressing Goal: STG-Other (Specify): Description: STG-Other Concurrent Medical (Specify): Outcome: Progressing   Problem: Education: Goal: Utilization of techniques to improve thought processes will improve Outcome: Progressing Goal: Knowledge of the prescribed therapeutic regimen will improve Outcome: Progressing

## 2021-06-09 NOTE — Progress Notes (Signed)
Recreation Therapy Notes   Date: 06/09/2021  Time: 10:00 am   Location: Courtyard   Behavioral response: Appropriate  Intervention Topic: Leisure   Discussion/Intervention:  Group content today was focused on leisure. The group defined what leisure is and some positive leisure activities they participate in. Individuals identified the difference between good and bad leisure. Participants expressed how they feel after participating in the leisure of their choice. The group discussed how they go about picking a leisure activity and if others are involved in their leisure activities. The patient stated how many leisure activities they have to choose from and reasons why it is important to have leisure time. Individuals participated in the intervention "Exploration of Leisure" where they had a chance to identify new leisure activities as well as benefits of leisure. Clinical Observations/Feedback: Patient came to group and was alert and focused on socializing with peers and staff. Individual was social with staff and peers while participating in the intervention. Shiela Bruns LRT/CTRS         Tricia Kerr 06/09/2021 11:55 AM

## 2021-06-10 ENCOUNTER — Other Ambulatory Visit: Payer: Self-pay

## 2021-06-10 MED ORDER — METOPROLOL TARTRATE 25 MG PO TABS: 25.0000 mg | ORAL_TABLET | Freq: Two times a day (BID) | ORAL | 0 refills | Status: DC

## 2021-06-10 MED ORDER — BENZTROPINE MESYLATE 0.5 MG PO TABS
0.5000 mg | ORAL_TABLET | Freq: Every day | ORAL | 0 refills | Status: DC
  Filled 2021-06-10: qty 7, 7d supply, fill #0

## 2021-06-10 MED ORDER — OLANZAPINE 10 MG PO TABS: 10.0000 mg | ORAL_TABLET | Freq: Every day | ORAL | 0 refills | Status: DC

## 2021-06-10 MED ORDER — OLANZAPINE 10 MG PO TABS
10.0000 mg | ORAL_TABLET | Freq: Every day | ORAL | 0 refills | Status: DC
  Filled 2021-06-10: qty 7, 7d supply, fill #0

## 2021-06-10 MED ORDER — METOPROLOL TARTRATE 25 MG PO TABS
25.0000 mg | ORAL_TABLET | Freq: Two times a day (BID) | ORAL | Status: DC
  Administered 2021-06-10 – 2021-06-12 (×5): 25 mg via ORAL
  Filled 2021-06-10 (×5): qty 1

## 2021-06-10 MED ORDER — METOPROLOL TARTRATE 25 MG PO TABS
25.0000 mg | ORAL_TABLET | Freq: Two times a day (BID) | ORAL | 0 refills | Status: DC
  Filled 2021-06-10: qty 14, 7d supply, fill #0

## 2021-06-10 NOTE — Progress Notes (Signed)
D: Pt alert and oriented. Pt rates depression 0/10, hopelessness 0/10, and anxiety 0/10. Pt goal: "Release." Pt reports energy level as normal and concentration as being good. Pt reports sleep last night as being good. Pt did receive medications for sleep and did find them helpful. Pt denies experiencing any pain at this time. Pt denies/reports experiencing any SI/HI, or AVH at this time.   Pt is calm and cooperative. Pt is quiet unless approached and spoken to. Pt can be observed in the milieu. Pt attended CSW group today and found it helpful.  A: Scheduled medications administered to pt, per MD orders. Support and encouragement provided. Frequent verbal contact made. Routine safety checks conducted q15 minutes.   R: No adverse drug reactions noted. Pt verbally contracts for safety at this time. Pt complaint with medications. Pt interacts well with others on the unit. Pt remains safe at this time. Will continue to monitor.

## 2021-06-10 NOTE — Progress Notes (Addendum)
Recreation Therapy Notes   Date: 06/09/2021  Time: 9:30 am   Location: Courtyard   Behavioral response: Appropriate  Intervention Topic: Leisure   Discussion/Intervention:  Group content today was focused on Wellness. The group defined wellness and some positive ways they make decisions for themselves. Individuals expressed reasons why they neglected any wellness in the past. Patients described ways to improve wellness skills in the future. The group explained what could happen if they did not do any wellness at all. Participants express how bad choices has affected them and others around them. Individual explained the importance of wellness. The group participated in the intervention "Testing my Wellness" where they had a chance to identify some of their weaknesses and strengths in wellness.  Clinical Observations/Feedback: Patient came to group and was alert and focused on socializing with peers and staff. Individual was social with staff and peers while participating in the intervention. Takyia Sindt LRT/CTRS         Renton Berkley 06/10/2021 11:06 AM

## 2021-06-10 NOTE — Progress Notes (Signed)
"[facility] MD Progress Note  06/10/2021 11:00 AM Tricia Kerr  MRN:  [id number]  CC "I'm fine"  Subjective:  35 year old female presenting for worsening psychosis.  No acute events overnight, attending to ADLs, took oral medications overnight.  Patient seen one-on-one again today.  She is somewhat irritable on exam, but does answer questions appropriately.  She has been sleeping better overnight, and is documented 8 hours of sleep.  She denies any further episodes of vomiting.  She also denies suicidal ideations, homicidal ideations, visual hallucinations, auditory hallucinations.  When discussing discharge planning there is still a hint of paranoia as she still seems to be very fixated on not fully trusting her husband [place].   Principal Problem: Bipolar I disorder, single manic episode, severe, with psychosis (HCC) Diagnosis: Principal Problem:   Bipolar I disorder, single manic episode, severe, with psychosis (HCC) Active Problems:   Tobacco use disorder   Cannabis use disorder, severe, dependence (HCC)  Total Time spent with patient: 30 minutes  Past Psychiatric History: See H&P  Past Medical History:  Past Medical History:  Diagnosis Date   Anxiety    Bipolar disorder (HCC)     Past Surgical History:  Procedure Laterality Date   APPENDECTOMY     TUBAL LIGATION     tubial     Family History: History reviewed. No pertinent family history. Family Psychiatric  History: See H&P Social History:  Social History   Substance and Sexual Activity  Alcohol Use Yes     Social History   Substance and Sexual Activity  Drug Use Yes   Types: Marijuana   Comment: months    Social History   Socioeconomic History   Marital status: Divorced    Spouse name: Not on file   Number of children: Not on file   Years of education: Not on file   Highest education level: Not on file  Occupational History   Not on file  Tobacco Use   Smoking status: Every Day    Packs/day: 1.00    Pack  years: 0.00    Types: Cigarettes   Smokeless tobacco: Never  Substance and Sexual Activity   Alcohol use: Yes   Drug use: Yes    Types: Marijuana    Comment: months   Sexual activity: Not Currently  Other Topics Concern   Not on file  Social History Narrative   Not on file   Social Determinants of Health   Financial Resource Strain: Not on file  Food Insecurity: Not on file  Transportation Needs: Not on file  Physical Activity: Not on file  Stress: Not on file  Social Connections: Not on file   Additional Social History:                         Sleep: Fair  Appetite:  Fair  Current Medications: Current Facility-Administered Medications  Medication Dose Route Frequency Provider Last Rate Last Admin   acetaminophen (TYLENOL) tablet 1,000 mg  1,000 mg Oral Q6H PRN Gillermo Murdoch, NP   1,000 mg at 06/09/21 1935   alum & mag hydroxide-simeth (MAALOX/MYLANTA) 200-200-20 MG/5ML suspension 30 mL  30 mL Oral Q4H PRN Gillermo Murdoch, NP       benztropine (COGENTIN) tablet 0.5 mg  0.5 mg Oral BID Gillermo Murdoch, NP   0.5 mg at 06/10/21 0821   ibuprofen (ADVIL) tablet 600 mg  600 mg Oral Q6H PRN Gillermo Murdoch, NP   600 mg at 06/06/21 [phone]  lidocaine (LIDODERM) 5 % 1 patch  1 patch Transdermal Q24H Jesse Sans, MD   1 patch at 06/08/21 1408   magnesium hydroxide (MILK OF MAGNESIA) suspension 30 mL  30 mL Oral Daily PRN Gillermo Murdoch, NP       metoprolol tartrate (LOPRESSOR) tablet 25 mg  25 mg Oral BID Jesse Sans, MD   25 mg at 06/10/21 0909   OLANZapine (ZYPREXA) tablet 10 mg  10 mg Oral QHS Jesse Sans, MD   10 mg at 06/09/21 2115   Or   OLANZapine (ZYPREXA) injection 10 mg  10 mg Intramuscular QHS Jesse Sans, MD       ondansetron (ZOFRAN-ODT) disintegrating tablet 4 mg  4 mg Oral Q8H PRN Gillermo Murdoch, NP   4 mg at 06/09/21 1935   traZODone (DESYREL) tablet 50 mg  50 mg Oral QHS PRN Gillermo Murdoch, NP   50  mg at 06/09/21 2118    Lab Results: No results found for this or any previous visit (from the past 48 hour(s)).  Blood Alcohol level:  Lab Results  Component Value Date   ETH <10 05/31/2021   ETH <5 03/20/2017    Metabolic Disorder Labs: Lab Results  Component Value Date   HGBA1C 5.5 06/04/2021   MPG 111.15 06/04/2021   No results found for: PROLACTIN Lab Results  Component Value Date   CHOL 170 06/04/2021   TRIG 68 06/04/2021   HDL 41 06/04/2021   CHOLHDL 4.1 06/04/2021   VLDL 14 06/04/2021   LDLCALC 115 (H) 06/04/2021    Physical Findings: AIMS:  , ,  ,  ,    CIWA:    COWS:     Musculoskeletal: Strength & Muscle Tone: within normal limits Gait & Station: normal Patient leans: N/A  Psychiatric Specialty Exam:  Presentation  General Appearance: Casual Eye Contact:Fair Speech:Slow  Speech Volume:Increased  Handedness:Right   Mood and Affect  Mood:Irritable Affect:Congruent   Thought Process  Thought Processes:Disorganized  Descriptions of Associations:Loose  Orientation:Full (Time, Place and Person)  Thought Content:Paranoid Ideation; Delusions; Scattered  History of Schizophrenia/Schizoaffective disorder:Yes  Duration of Psychotic Symptoms:Greater than six months  Hallucinations:Denies Ideas of Reference:Paranoia; Percusatory  Suicidal Thoughts:Denies  Homicidal Thoughts:Denies   Sensorium  Memory:Immediate Fair; Recent Fair; Remote Fair  Judgment:Impaired  Insight:Lacking   Art therapist  Concentration:Poor  Attention Span:Poor  Recall:Poor  Fund of Knowledge:Fair  Language:Fair   Psychomotor Activity  Psychomotor Activity:No data recorded   Assets  Assets:Housing; Social Support   Sleep  Sleep:Good, 8 hours   Physical Exam: Physical Exam ROS Blood pressure 126/89, pulse 96, temperature 98.4 F (36.9 C), temperature source Oral, resp. rate 18, height 4\' 9"  (1.448 m), weight 67.6 kg, SpO2 100 %.  Body mass index is 32.24 kg/m.   Treatment Plan Summary: Daily contact with patient to assess and evaluate symptoms and progress in treatment and Medication management 35 year old female presenting with acute psychosis and mania. Patient irritable on exam, but more cooperative today. She does not freely discuss delusional content today, but continues to appear guarded and paranoid. Nausea improved. Continue non-emergent forced Zyprexa 10 mg p.o. or IM. Start Metoprolol 25 mg BID for tachycardia.   32, MD 06/10/2021, 11:00 AM

## 2021-06-10 NOTE — Progress Notes (Signed)
Patient presented to the nurses station requesting to talk to [facility] regarding discharge plan. CSW met with patient in room. CSW aske patient if she had decided on outpatient provider. She reported that she was going to followup with [facility] and that her spouse was on his way to pick her up now. CSW informed her that she has not yet been discharged. CSW proceeded to ask if she wanted to provide consent for the hospital to provide [facility] with medical records. She reported that "[employer] . . . who is works for the [facility] has already done that." Patient dismissed CSW who left promptly.   Signed:  Durenda Hurt, MSW, [place], LCASA 06/10/2021 3:35 PM

## 2021-06-10 NOTE — Progress Notes (Signed)
Has a complaint of back pain and pain on side beneath rib cage.  Asked for Tylenol to help ease the symptoms. Will continue to monitor with Q15 minute safety checks. Encouraged her to come back if symptoms persist or worsen.     Cleo Butler-Nicholson, LPN

## 2021-06-11 MED ORDER — OLANZAPINE 5 MG PO TBDP
15.0000 mg | ORAL_TABLET | Freq: Every day | ORAL | Status: DC
  Administered 2021-06-11: 15 mg via ORAL
  Filled 2021-06-11: qty 3

## 2021-06-11 MED ORDER — OLANZAPINE 10 MG IM SOLR: 15.0000 mg | Freq: Every day | INTRAMUSCULAR | Status: DC
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
## 2021-06-11 NOTE — Progress Notes (Signed)
Received her QHS medication with out incident. Reports back pain is subsiding but continues to have feelings like she can not breath at times with a pain that occurs either on her side below her rib cage or in her chest. Reports having these feeling for as long as she can remember.  Inquired about her anxiety and if she knew her symptoms or triggers.  Suggested that she make a list of things that she knows causes her anxiety and start making notes when she is having the feelings. Also encouraged her to discuss the issue with the doctor.    Cleo Butler-Nicholson, LPN

## 2021-06-11 NOTE — B[facility] Group Notes (Signed)
LCSW Group Therapy Note  06/11/2021 2:24 PM  Type of Therapy/Topic:  Group Therapy:  Emotion Regulation  Participation Level:  Active   Description of Group:   The purpose of this group is to assist patients in learning to regulate negative emotions and experience positive emotions. Patients will be guided to discuss ways in which they have been vulnerable to their negative emotions. These vulnerabilities will be juxtaposed with experiences of positive emotions or situations, and patients will be challenged to use positive emotions to combat negative ones. Special emphasis will be placed on coping with negative emotions in conflict situations, and patients will process healthy conflict resolution skills.  Therapeutic Goals: Patient will identify two positive emotions or experiences to reflect on in order to balance out negative emotions Patient will label two or more emotions that they find the most difficult to experience Patient will demonstrate positive conflict resolution skills through discussion and/or role plays  Summary of Patient Progress: Patient was present for the majority of the group session. Patient was an active listener and participated in the topic of discussion, provided helpful advice to others, and added nuance to topic of conversation. Patient contributed to group topic by expressing frustration about her discharge status. Patient became frustrated regarding her length of stay and demonstrated positive coping skill by leaving the room.     Therapeutic Modalities:   Cognitive Behavioral Therapy Feelings Identification Dialectical Behavioral Therapy  Gwenevere Ghazi, MSW, [place], [place] 06/11/2021 2:24 PM

## 2021-06-11 NOTE — Plan of Care (Signed)
  Problem: BHH Concurrent Medical Problem Goal: LTG-Pt will be physically stable and he/significant other Description: (Patient will be physically stable and he/significant other will be able to verbalize understanding of follow-up care and symptoms that would warrant further treatment) Outcome: Progressing Goal: STG-Vital signs will be within defined limits or stabilized Description: (STG- Vital signs will be within defined limits or stabilized for individual) Outcome: Progressing Goal: STG-Compliance with medication and/or treatment as ordered Description: (STG-Compliance with medication and/or treatment as ordered by MD) Outcome: Progressing Goal: STG-Verbalize two symptoms that would warrant further Description: (STG-Verbalize two symptoms that would warrant further treatment) Outcome: Progressing Goal: STG-Patient will participate in management/stabilization Description: (STG-Patient will participate in management/stabilization of medical condition) Outcome: Progressing Goal: STG-Other (Specify): Description: STG-Other Concurrent Medical (Specify): Outcome: Progressing   Problem: Education: Goal: Utilization of techniques to improve thought processes will improve Outcome: Progressing   Problem: Activity: Goal: Interest or engagement in leisure activities will improve Outcome: Progressing Goal: Imbalance in normal sleep/wake cycle will improve Outcome: Progressing   Problem: Coping: Goal: Coping ability will improve Outcome: Progressing Goal: Will verbalize feelings Outcome: Progressing   Problem: Health Behavior/Discharge Planning: Goal: Ability to make decisions will improve Outcome: Progressing Goal: Compliance with therapeutic regimen will improve Outcome: Progressing

## 2021-06-11 NOTE — Plan of Care (Signed)
  Problem: Coping: Goal: Ability to identify and develop effective coping behavior will improve 06/11/2021 0210 by Butler-Nicholson, Algie Westry L, LPN Outcome: Progressing 06/11/2021 0203 by Butler-Nicholson, Brooklen Runquist L, LPN Outcome: Progressing   Problem: Self-Concept: Goal: Will verbalize positive feelings about self 06/11/2021 0210 by Butler-Nicholson, Reese Senk L, LPN Outcome: Progressing 06/11/2021 0203 by Butler-Nicholson, Okie Bogacz L, LPN Outcome: Progressing   Problem: Self-Concept: Goal: Will verbalize positive feelings about self 06/11/2021 0210 by Butler-Nicholson, Tawney Vanorman L, LPN Outcome: Progressing 06/11/2021 0203 by Butler-Nicholson, Sheria Rosello L, LPN Outcome: Progressing   Problem: Self-Concept: Goal: Will verbalize positive feelings about self 06/11/2021 0210 by Butler-Nicholson, Undray Allman L, LPN Outcome: Progressing 06/11/2021 0203 by Butler-Nicholson, Lakiyah Arntson L, LPN Outcome: Progressing Goal: Level of anxiety will decrease 06/11/2021 0210 by Butler-Nicholson, Ronda Kazmi L, LPN Outcome: Progr Problem: Coping: Goal: Ability to identify and develop effective coping behavior will improve 06/11/2021 0203 by Butler-Nicholson, Kalyb Pemble L, LPN Outcome: Progressing  essing 06/11/2021 0203 by Butler-Nicholson, Fayth Trefry L, LPN Outcome: Progressing

## 2021-06-12 MED ORDER — METOPROLOL TARTRATE 25 MG PO TABS: 25.0000 mg | ORAL_TABLET | Freq: Two times a day (BID) | ORAL | 1 refills | Status: DC

## 2021-06-12 MED ORDER — OLANZAPINE 15 MG PO TABS: 15.0000 mg | ORAL_TABLET | Freq: Every day | ORAL | 1 refills | Status: DC

## 2021-06-12 MED ORDER — BENZTROPINE MESYLATE 0.5 MG PO TABS: 0.5000 mg | ORAL_TABLET | Freq: Every day | ORAL | 1 refills | Status: DC

## 2021-06-12 NOTE — Progress Notes (Signed)
  [facility] Adult Case Management Discharge Plan :  Will you be returning to the same living situation after discharge:  Yes,  Patient to return to place of residence. At discharge, do you have transportation home?: Yes,  Patient's spouse is providing transportation. Do you have the ability to pay for your medications: No.  Release of information consent forms completed and in the chart;  Patient's signature needed at discharge.  Patient to Follow up at:  Follow-up Information     [facility] Call.   Why: As needed Contact information: [address] [place] [address] [phone]                 Next level of care provider has access to [facility] Link:no  Safety Planning and Suicide Prevention discussed: Yes,  completed with patient. Patient declined to provide consent for collateral contact.     Has patient been referred to the Quitline?: Patient refused referral  Patient has been referred for addiction treatment: Pt. refused referral  Corky Crafts, LCSWA 06/12/2021, 11:55 AM

## 2021-06-12 NOTE — Discharge Summary (Signed)
Physician Discharge Summary Note  Patient:  Tricia Kerr is an 35 y.o., female MRN:  [id number] DOB:  April 10, 1986 Patient phone:  [phone] (home)  Patient address:   [address] [place] [address],  Total Time spent with patient: 35 minutes- 25 minutes face-to-face contact with patient, 10 minutes documentation, coordination of care, scripts   Date of Admission:  06/02/2021 Date of Discharge: 06/12/2021  Reason for Admission:  35 year old female presenting for worsening psychosis.  Principal Problem: Bipolar I disorder, single manic episode, severe, with psychosis (HCC) Discharge Diagnoses: Principal Problem:   Bipolar I disorder, single manic episode, severe, with psychosis (HCC) Active Problems:   Tobacco use disorder   Cannabis use disorder, severe, dependence (HCC)   Past Psychiatric History:  Patient hospitalized numerous times as a child and most recently in 2016.  Her admissions are mostly for psychosis someone is most consistent with bipolar 1 disorder with psychotic features.  When she has decompensated she does not sleep well and becomes very paranoid about her husband.  She also has some believes that her house is still infested with uranium.  There is cooccurrence substance abuse with marijuana and Percocet.  She did well on Risperdal, but is subsequently discontinued.  She was seen by [facility] in the past but has no current outpatient provider.  Past Medical History:  Past Medical History:  Diagnosis Date   Anxiety    Bipolar disorder [facility])     Past Surgical History:  Procedure Laterality Date   APPENDECTOMY     TUBAL LIGATION     tubial     Family History: History reviewed. No pertinent family history. Family Psychiatric  History: Denies Social History:  Social History   Substance and Sexual Activity  Alcohol Use Yes     Social History   Substance and Sexual Activity  Drug Use Yes   Types: Marijuana   Comment: months    Social History    Socioeconomic History   Marital status: Divorced    Spouse name: Not on file   Number of children: Not on file   Years of education: Not on file   Highest education level: Not on file  Occupational History   Not on file  Tobacco Use   Smoking status: Every Day    Packs/day: 1.00    Pack years: 0.00    Types: Cigarettes   Smokeless tobacco: Never  Substance and Sexual Activity   Alcohol use: Yes   Drug use: Yes    Types: Marijuana    Comment: months   Sexual activity: Not Currently  Other Topics Concern   Not on file  Social History Narrative   Not on file   Social Determinants of Health   Financial Resource Strain: Not on file  Food Insecurity: Not on file  Transportation Needs: Not on file  Physical Activity: Not on file  Stress: Not on file  Social Connections: Not on file    Hospital Course:  35 year old female presenting for worsening psychosis. Patient was initially restarted on Risperdal, but began to refuse to take medication due to oversedation. Abilify was tried, but ineffective. She was then transitioned to Zyprexa and titrated to 15 mg nightly. With this medication mood improved, she was able to sleep through the night, and delusional content resolved. She denies suicidal ideations, homicidal ideations, visual hallucinations, and auditory hallucinations. Her husband, [place], was contacted at [phone] who confirmed patient was at baseline. He denied any concerns about her being a danger  to herself or others. He will be picking patient up today, and she will follow-up with [facility].   Physical Findings: AIMS: Facial and Oral Movements Muscles of Facial Expression: None, normal Lips and Perioral Area: None, normal Jaw: None, normal Tongue: None, normal,Extremity Movements Upper (arms, wrists, hands, fingers): None, normal Lower (legs, knees, ankles, toes): None, normal, Trunk Movements Neck, shoulders, hips: None, normal, Overall Severity Severity of abnormal  movements (highest score from questions above): None, normal Incapacitation due to abnormal movements: None, normal Patient's awareness of abnormal movements (rate only patient's report): No Awareness, Dental Status Current problems with teeth and/or dentures?: No Does patient usually wear dentures?: No  CIWA:    COWS:     Musculoskeletal: Strength & Muscle Tone: within normal limits Gait & Station: normal Patient leans: N/A   Psychiatric Specialty Exam:  Presentation  General Appearance: Casual; Appropriate for Environment  Eye Contact:Good  Speech:Normal Rate  Speech Volume:Normal  Handedness:Right   Mood and Affect  Mood:Euthymic  Affect:Congruent   Thought Process  Thought Processes:Coherent; Goal Directed  Descriptions of Associations:Intact  Orientation:Full (Time, Place and Person)  Thought Content:Logical  History of Schizophrenia/Schizoaffective disorder:No  Duration of Psychotic Symptoms:Greater than six months  Hallucinations:Hallucinations: None  Ideas of Reference:None  Suicidal Thoughts:Suicidal Thoughts: No  Homicidal Thoughts:Homicidal Thoughts: No   Sensorium  Memory:Immediate Fair; Recent Fair; Remote Fair  Judgment:Intact  Insight:Shallow   Executive Functions  Concentration:Good  Attention Span:Good  Recall:Good  Fund of Knowledge:Good  Language:Good   Psychomotor Activity  Psychomotor Activity:Psychomotor Activity: Normal   Assets  Assets:Communication Skills; Desire for Improvement; Housing; Intimacy; Leisure Time; Physical Health; Social Support; Talents/Skills   Sleep  Sleep:Sleep: Good Number of Hours of Sleep: 8    Physical Exam: Physical Exam Vitals and nursing note reviewed.  Constitutional:      Appearance: Normal appearance.  HENT:     Head: Normocephalic and atraumatic.     Right Ear: External ear normal.     Left Ear: External ear normal.     Nose: Nose normal.     Mouth/Throat:      Mouth: Mucous membranes are moist.     Pharynx: Oropharynx is clear.  Eyes:     Extraocular Movements: Extraocular movements intact.     Conjunctiva/sclera: Conjunctivae normal.     Pupils: Pupils are equal, round, and reactive to light.  Cardiovascular:     Rate and Rhythm: Normal rate.     Pulses: Normal pulses.  Pulmonary:     Effort: Pulmonary effort is normal.     Breath sounds: Normal breath sounds.  Abdominal:     General: Abdomen is flat.     Palpations: Abdomen is soft.  Musculoskeletal:        General: No swelling. Normal range of motion.     Cervical back: Normal range of motion and neck supple.  Skin:    General: Skin is warm and dry.  Neurological:     General: No focal deficit present.     Mental Status: She is alert and oriented to person, place, and time.  Psychiatric:        Mood and Affect: Mood normal.        Behavior: Behavior normal.        Thought Content: Thought content normal.        Judgment: Judgment normal.   Review of Systems  Constitutional: Negative.   HENT: Negative.    Eyes: Negative.   Respiratory: Negative.    Cardiovascular: Negative.  Gastrointestinal: Negative.   Genitourinary: Negative.   Musculoskeletal: Negative.   Skin: Negative.   Neurological: Negative.   Endo/Heme/Allergies:  Positive for environmental allergies. Does not bruise/bleed easily.  Psychiatric/Behavioral:  Negative for depression, hallucinations, memory loss, substance abuse and suicidal ideas. The patient is not nervous/anxious and does not have insomnia.   Blood pressure 137/88, pulse (!) 101, temperature 98.3 F (36.8 C), temperature source Oral, resp. rate 16, height 4\' 9"  (1.448 m), weight 67.6 kg, SpO2 99 %. Body mass index is 32.24 kg/m.   Social History   Tobacco Use  Smoking Status Every Day   Packs/day: 1.00   Pack years: 0.00   Types: Cigarettes  Smokeless Tobacco Never   Tobacco Cessation:  A prescription for an FDA-approved tobacco cessation  medication was offered at discharge and the patient refused   Blood Alcohol level:  Lab Results  Component Value Date   [facility] <10 05/31/2021   ETH <5 03/20/2017    Metabolic Disorder Labs:  Lab Results  Component Value Date   HGBA1C 5.5 06/04/2021   MPG 111.15 06/04/2021   No results found for: PROLACTIN Lab Results  Component Value Date   CHOL 170 06/04/2021   TRIG 68 06/04/2021   HDL 41 06/04/2021   CHOLHDL 4.1 06/04/2021   VLDL 14 06/04/2021   LDLCALC 115 (H) 06/04/2021    See Psychiatric Specialty Exam and Suicide Risk Assessment completed by Attending Physician prior to discharge.  Discharge destination:  Home  Is patient on multiple antipsychotic therapies at discharge:  No   Has Patient had three or more failed trials of antipsychotic monotherapy by history:  No  Recommended Plan for Multiple Antipsychotic Therapies: NA  Discharge Instructions     Diet - low sodium heart healthy   Complete by: As directed    Increase activity slowly   Complete by: As directed       Allergies as of 06/12/2021       Reactions   Nicotine Anaphylaxis   Nicotine patches         Medication List     TAKE these medications      Indication  benztropine 0.5 MG tablet Commonly known as: COGENTIN Take 1 tablet (0.5 mg total) by mouth at bedtime.  Indication: Extrapyramidal Reaction caused by Medications   metoprolol tartrate 25 MG tablet Commonly known as: LOPRESSOR Take 1 tablet (25 mg total) by mouth 2 (two) times daily.  Indication: Supraventricular Tachycardia   OLANZapine 15 MG tablet Commonly known as: ZYPREXA Take 1 tablet (15 mg total) by mouth at bedtime.  Indication: Hypomanic Episode of Bipolar Disorder        Follow-up Information     [facility] Call.   Why: As needed Contact information: [address] [place] [phone]                 Follow-up recommendations:  Activity:  as tolerated Diet:   low sodium heart healthy diet  Comments:  7-day supply free medications provided along with printed 30-day scripts with 1 refill  Signed: [phone], MD 06/12/2021, 11:32 AM

## 2021-06-12 NOTE — Progress Notes (Signed)
Pt. Discharged with personal belongings, prescriptions, and all paperwork printed.  Pt. escorted out by nurse tech to medical mall.

## 2021-06-12 NOTE — BHH Suicide Risk Assessment (Signed)
[facility] Discharge Suicide Risk Assessment   Principal Problem: Bipolar I disorder, single manic episode, severe, with psychosis (HCC) Discharge Diagnoses: Principal Problem:   Bipolar I disorder, single manic episode, severe, with psychosis (HCC) Active Problems:   Tobacco use disorder   Cannabis use disorder, severe, dependence (HCC)   Total Time spent with patient: 35 minutes- 25 minutes face-to-face contact with patient, 10 minutes documentation, coordination of care, scripts   Musculoskeletal: Strength & Muscle Tone: within normal limits Gait & Station: normal Patient leans: N/A  Psychiatric Specialty Exam  Presentation  General Appearance: Casual; Appropriate for Environment  Eye Contact:Good  Speech:Normal Rate  Speech Volume:Normal  Handedness:Right   Mood and Affect  Mood:Euthymic  Duration of Depression Symptoms: No data recorded Affect:Congruent   Thought Process  Thought Processes:Coherent; Goal Directed  Descriptions of Associations:Intact  Orientation:Full (Time, Place and Person)  Thought Content:Logical  History of Schizophrenia/Schizoaffective disorder:No  Duration of Psychotic Symptoms:Greater than six months  Hallucinations:Hallucinations: None  Ideas of Reference:None  Suicidal Thoughts:Suicidal Thoughts: No  Homicidal Thoughts:Homicidal Thoughts: No   Sensorium  Memory:Immediate Fair; Recent Fair; Remote Fair  Judgment:Intact  Insight:Shallow   Executive Functions  Concentration:Good  Attention Span:Good  Recall:Good  Fund of Knowledge:Good  Language:Good   Psychomotor Activity  Psychomotor Activity:Psychomotor Activity: Normal   Assets  Assets:Communication Skills; Desire for Improvement; Housing; Intimacy; Leisure Time; Physical Health; Social Support; Talents/Skills   Sleep  Sleep:Sleep: Good Number of Hours of Sleep: 8   Physical Exam: Physical Exam ROS Blood pressure 137/88, pulse (!) 101,  temperature 98.3 F (36.8 C), temperature source Oral, resp. rate 16, height 4\' 9"  (1.448 m), weight 67.6 kg, SpO2 99 %. Body mass index is 32.24 kg/m.  Mental Status Per Nursing Assessment::   On Admission:  NA  Demographic Factors:  Caucasian  Loss Factors: NA  Historical Factors: Impulsivity  Risk Reduction Factors:   Responsible for children under 56 years of age, Sense of responsibility to family, Living with another person, especially a relative, Positive social support, Positive therapeutic relationship, and Positive coping skills or problem solving skills  Continued Clinical Symptoms:  Bipolar Disorder:   Mixed State Previous Psychiatric Diagnoses and Treatments  Cognitive Features That Contribute To Risk:  None    Suicide Risk:  Minimal: No identifiable suicidal ideation.  Patients presenting with no risk factors but with morbid ruminations; may be classified as minimal risk based on the severity of the depressive symptoms   Follow-up Information     [facility] Call.   Why: As needed Contact information: [address] [place] [phone]                 Plan Of Care/Follow-up recommendations:  Activity:  as tolerated Diet:  low sodium heart healthy diet  [phone], MD 06/12/2021, 11:26 AM

## 2021-06-12 NOTE — Plan of Care (Signed)
Patient denied SI / HI / AVH. Patient is adherent with scheduled medications. Received PRN for sleep which was effective. No signs of distress or injury Noted.    Problem: Education: Goal: Utilization of techniques to improve thought processes will improve Outcome: Not Progressing Goal: Knowledge of the prescribed therapeutic regimen will improve Outcome: Not Progressing

## 2021-06-17 ENCOUNTER — Encounter: Payer: Self-pay | Admitting: Emergency Medicine

## 2021-06-17 ENCOUNTER — Other Ambulatory Visit: Payer: Self-pay

## 2021-06-17 ENCOUNTER — Emergency Department
Admission: EM | Admit: 2021-06-17 | Discharge: 2021-06-17 | Disposition: A | Discharge status: Home / Self Care | Payer: Self-pay | Attending: Emergency Medicine | Admitting: Emergency Medicine

## 2021-06-17 DIAGNOSIS — G8929 Other chronic pain: Secondary | ICD-10-CM | POA: Insufficient documentation

## 2021-06-17 DIAGNOSIS — R0781 Pleurodynia: Secondary | ICD-10-CM | POA: Insufficient documentation

## 2021-06-17 DIAGNOSIS — F17210 Nicotine dependence, cigarettes, uncomplicated: Secondary | ICD-10-CM | POA: Insufficient documentation

## 2021-06-17 DIAGNOSIS — M545 Low back pain, unspecified: Secondary | ICD-10-CM | POA: Insufficient documentation

## 2021-06-17 LAB — COMPREHENSIVE METABOLIC PANEL
ALT: 27 U/L (ref 0–44)
AST: 21 U/L (ref 15–41)
Albumin: 3.9 g/dL (ref 3.5–5.0)
Alkaline Phosphatase: 81 U/L (ref 38–126)
Anion gap: 8 (ref 5–15)
BUN: 8 mg/dL (ref 6–20)
CO2: 26 mmol/L (ref 22–32)
Calcium: 9.6 mg/dL (ref 8.9–10.3)
Chloride: 104 mmol/L (ref 98–111)
Creatinine, Ser: 0.72 mg/dL (ref 0.44–1.00)
GFR, Estimated: >60 mL/min (ref >60)
Glucose, Bld: 137 mg/dL — ABNORMAL HIGH (ref 70–99)
Potassium: 3.8 mmol/L (ref 3.5–5.1)
Sodium: 138 mmol/L (ref 135–145)
Total Bilirubin: 1.4 mg/dL — ABNORMAL HIGH (ref 0.3–1.2)
Total Protein: 7.5 g/dL (ref 6.5–8.1)

## 2021-06-17 LAB — CBC WITH DIFFERENTIAL/PLATELET
Abs Immature Granulocytes: 0.09 10*3/uL — ABNORMAL HIGH (ref 0.00–0.07)
Basophils Absolute: 0.1 10*3/uL (ref 0.0–0.1)
Basophils Relative: 1 %
Eosinophils Absolute: 0 10*3/uL (ref 0.0–0.5)
Eosinophils Relative: 0 %
HCT: 41.9 % (ref 36.0–46.0)
Hemoglobin: 14.7 g/dL (ref 12.0–15.0)
Immature Granulocytes: 1 %
Lymphocytes Relative: 16 %
Lymphs Abs: 2.7 10*3/uL (ref 0.7–4.0)
MCH: 30 pg (ref 26.0–34.0)
MCHC: 35.1 g/dL (ref 30.0–36.0)
MCV: 85.5 fL (ref 80.0–100.0)
Monocytes Absolute: 1.1 10*3/uL — ABNORMAL HIGH (ref 0.1–1.0)
Monocytes Relative: 6 %
Neutro Abs: 13.6 10*3/uL — ABNORMAL HIGH (ref 1.7–7.7)
Neutrophils Relative %: 76 %
Platelets: 397 10*3/uL (ref 150–400)
RBC: 4.9 MIL/uL (ref 3.87–5.11)
RDW: 13.4 % (ref 11.5–15.5)
WBC: 17.6 10*3/uL — ABNORMAL HIGH (ref 4.0–10.5)
nRBC: 0 % (ref 0.0–0.2)

## 2021-06-17 LAB — GLUCOSE, CAPILLARY: Glucose-Capillary: 99 mg/dL (ref 70–99)

## 2021-06-17 LAB — POC URINE PREG, ED: Preg Test, Ur: NEGATIVE

## 2021-06-17 NOTE — ED Triage Notes (Signed)
C/O abdominal bloating since 2016 and c/o bilateral rib pain since 2016 and also low back pain.  Patient states "I need rib xray's.  I think my ribs are broken".   AAOx3.  Skin warm and dry. NAD

## 2021-06-17 NOTE — ED Notes (Signed)
Pt refused to give registration consent for treatment. EDP to bedside to ask if patient wanted treatment. Pt states that she changed her mind and would like to leave a this time. Pt alert and oriented X4, cooperative, RR even and unlabored, color WNL. Pt in NAD. ambulatory

## 2021-06-17 NOTE — ED Provider Notes (Signed)
[facility] Emergency Department Provider Note   ____________________________________________   I have reviewed the triage vital signs and the nursing notes.   HISTORY  Chief Complaint Low back pain   History limited by: Not Limited   HPI Tricia Kerr is a 35 y.o. female who presents to the emergency department today because of concern for low back pain. She states that she has had pain in her lower back since 2016 when she fell on some rocks. She says she has not talked to anyone about this pain in a long time. Additionally she says she is having pain to her bilateral lower ribs which has also been going on for years. It does not appear that there are any new or acute complaints that caused the patient to come to the emergency department today.    Records reviewed. Per medical record review patient has a history of bipolar, anxiety, recent admission for behavioral health.  Past Medical History:  Diagnosis Date   Anxiety    Bipolar disorder [facility])     Patient Active Problem List   Diagnosis Date Noted   Bipolar I disorder, single manic episode, severe, with psychosis (HCC) 06/01/2021   Intractable nausea and vomiting 03/20/2017   Tobacco use disorder 08/23/2015   Cannabis use disorder, severe, dependence (HCC) 08/23/2015    Past Surgical History:  Procedure Laterality Date   APPENDECTOMY     TUBAL LIGATION     tubial      Prior to Admission medications   Medication Sig Start Date End Date Taking? Authorizing Provider  benztropine (COGENTIN) 0.5 MG tablet Take 1 tablet (0.5 mg total) by mouth at bedtime. 06/12/21   Jesse Sans, MD  metoprolol tartrate (LOPRESSOR) 25 MG tablet Take 1 tablet (25 mg total) by mouth 2 (two) times daily. 06/12/21   Jesse Sans, MD  OLANZapine (ZYPREXA) 15 MG tablet Take 1 tablet (15 mg total) by mouth at bedtime. 06/12/21   Jesse Sans, MD    Allergies Nicotine  No family history on file.  Social  History Social History   Tobacco Use   Smoking status: Every Day    Packs/day: 1.00    Pack years: 0.00    Types: Cigarettes   Smokeless tobacco: Never  Substance Use Topics   Alcohol use: Yes   Drug use: Yes    Types: Marijuana    Comment: months    Review of Systems Constitutional: No fever/chills Eyes: No visual changes. ENT: No sore throat. Cardiovascular: Positive for bilateral lower chest pain. Respiratory: Denies shortness of breath. Gastrointestinal: No abdominal pain.  No nausea, no vomiting.  No diarrhea.   Genitourinary: Negative for dysuria. Musculoskeletal: Positive for low back pain. Skin: Negative for rash. Neurological: Negative for headaches, focal weakness or numbness.  ____________________________________________   PHYSICAL EXAM:  VITAL SIGNS: ED Triage Vitals  Enc Vitals Group     BP 06/17/21 1340 (!) 147/98     Pulse Rate 06/17/21 1340 (!) 124     Resp 06/17/21 1340 16     Temp 06/17/21 1340 98.4 F (36.9 C)     Temp Source 06/17/21 1340 Oral     SpO2 06/17/21 1340 97 %     Weight 06/17/21 1337 149 lb 14.6 oz (68 kg)     Height 06/17/21 1337 4\' 9"  (1.448 m)     Head Circumference --      Peak Flow --      Pain Score 06/17/21 1336 7  Constitutional: Alert and oriented.  Eyes: Conjunctivae are normal.  ENT      Head: Normocephalic and atraumatic.      Nose: No congestion/rhinnorhea.      Mouth/Throat: Mucous membranes are moist.      Neck: No stridor. Hematological/Lymphatic/Immunilogical: No cervical lymphadenopathy. Cardiovascular: Tachycardic, regular rhythm.  No murmurs, rubs, or gallops.  Respiratory: Normal respiratory effort without tachypnea nor retractions. Breath sounds are clear and equal bilaterally. No wheezes/rales/rhonchi. Gastrointestinal: Soft and non tender. No rebound. No guarding.  Genitourinary: Deferred Musculoskeletal: Normal range of motion in all extremities. No lower extremity edema. Neurologic:  Normal speech  and language. No gross focal neurologic deficits are appreciated.  Skin:  Skin is warm, dry and intact. No rash noted. Psychiatric: Mood and affect are normal. Speech and behavior are normal. Patient exhibits appropriate insight and judgment.  ____________________________________________    LABS (pertinent positives/negatives)  Pending at time of patient leaving hospital  ____________________________________________   EKG  None  ____________________________________________    RADIOLOGY  None  ____________________________________________   PROCEDURES  Procedures  ____________________________________________   INITIAL IMPRESSION / ASSESSMENT AND PLAN / ED COURSE  Pertinent labs & imaging results that were available during my care of the patient were reviewed by me and considered in my medical decision making (see chart for details).   Patient presented to the ER today because of concern for low back pain, also had concern for bilateral low rib pain. The patient was tachycardic as well. Did order blood work and imaging for the patient. However after my assessment and discussion of the plan the patient stated that she wanted to leave. The patient left the emergency department prior to my being able to have a thorough discussion or print out paperwork.  ____________________________________________   FINAL CLINICAL IMPRESSION(S) / ED DIAGNOSES  Final diagnoses:  Chronic low back pain, unspecified back pain laterality, unspecified whether sciatica present     Note: This dictation was prepared with Dragon dictation. Any transcriptional errors that result from this process are unintentional     Phineas Semen, MD 06/17/21 1531

## 2021-06-19 ENCOUNTER — Other Ambulatory Visit: Payer: Self-pay

## 2021-06-19 ENCOUNTER — Emergency Department
Admission: EM | Admit: 2021-06-19 | Discharge: 2021-06-20 | Disposition: A | Discharge status: Other Acute Inpatient Hospital | Payer: 59 | Attending: Emergency Medicine | Admitting: Emergency Medicine

## 2021-06-19 ENCOUNTER — Encounter: Payer: Self-pay | Admitting: Emergency Medicine

## 2021-06-19 DIAGNOSIS — F29 Unspecified psychosis not due to a substance or known physiological condition: Secondary | ICD-10-CM | POA: Insufficient documentation

## 2021-06-19 DIAGNOSIS — F17210 Nicotine dependence, cigarettes, uncomplicated: Secondary | ICD-10-CM | POA: Insufficient documentation

## 2021-06-19 DIAGNOSIS — F3160 Bipolar disorder, current episode mixed, unspecified: Secondary | ICD-10-CM

## 2021-06-19 DIAGNOSIS — F3164 Bipolar disorder, current episode mixed, severe, with psychotic features: Secondary | ICD-10-CM | POA: Insufficient documentation

## 2021-06-19 DIAGNOSIS — Z046 Encounter for general psychiatric examination, requested by authority: Secondary | ICD-10-CM | POA: Insufficient documentation

## 2021-06-19 DIAGNOSIS — Z79899 Other long term (current) drug therapy: Secondary | ICD-10-CM | POA: Insufficient documentation

## 2021-06-19 DIAGNOSIS — Z20822 Contact with and (suspected) exposure to covid-19: Secondary | ICD-10-CM | POA: Insufficient documentation

## 2021-06-19 DIAGNOSIS — R000 Tachycardia, unspecified: Secondary | ICD-10-CM | POA: Insufficient documentation

## 2021-06-19 LAB — COMPREHENSIVE METABOLIC PANEL
ALT: 27 U/L (ref 0–44)
AST: 25 U/L (ref 15–41)
Albumin: 4 g/dL (ref 3.5–5.0)
Alkaline Phosphatase: 82 U/L (ref 38–126)
Anion gap: 8 (ref 5–15)
BUN: 9 mg/dL (ref 6–20)
CO2: 24 mmol/L (ref 22–32)
Calcium: 9.1 mg/dL (ref 8.9–10.3)
Chloride: 106 mmol/L (ref 98–111)
Creatinine, Ser: 0.65 mg/dL (ref 0.44–1.00)
GFR, Estimated: >60 mL/min (ref >60)
Glucose, Bld: 198 mg/dL — ABNORMAL HIGH (ref 70–99)
Potassium: 3.2 mmol/L — ABNORMAL LOW (ref 3.5–5.1)
Sodium: 138 mmol/L (ref 135–145)
Total Bilirubin: 0.9 mg/dL (ref 0.3–1.2)
Total Protein: 7.3 g/dL (ref 6.5–8.1)

## 2021-06-19 LAB — URINALYSIS, COMPLETE (UACMP) WITH MICROSCOPIC
Bilirubin Urine: NEGATIVE
Glucose, UA: NEGATIVE mg/dL
Hgb urine dipstick: NEGATIVE
Ketones, ur: NEGATIVE mg/dL
Leukocytes,Ua: NEGATIVE
Nitrite: NEGATIVE
Protein, ur: NEGATIVE mg/dL
Specific Gravity, Urine: 1.016 (ref 1.005–1.030)
pH: 6 (ref 5.0–8.0)

## 2021-06-19 LAB — URINE DRUG SCREEN, QUALITATIVE (ARMC ONLY)
Amphetamines, Ur Screen: NOT DETECTED
Barbiturates, Ur Screen: NOT DETECTED
Benzodiazepine, Ur Scrn: NOT DETECTED
Cannabinoid 50 Ng, Ur ~~LOC~~: POSITIVE — AB
Cocaine Metabolite,Ur ~~LOC~~: NOT DETECTED
MDMA (Ecstasy)Ur Screen: NOT DETECTED
Methadone Scn, Ur: NOT DETECTED
Opiate, Ur Screen: NOT DETECTED
Phencyclidine (PCP) Ur S: NOT DETECTED
Tricyclic, Ur Screen: NOT DETECTED

## 2021-06-19 LAB — CBC
HCT: 39.9 % (ref 36.0–46.0)
Hemoglobin: 13.9 g/dL (ref 12.0–15.0)
MCH: 30.4 pg (ref 26.0–34.0)
MCHC: 34.8 g/dL (ref 30.0–36.0)
MCV: 87.3 fL (ref 80.0–100.0)
Platelets: 383 10*3/uL (ref 150–400)
RBC: 4.57 MIL/uL (ref 3.87–5.11)
RDW: 13.6 % (ref 11.5–15.5)
WBC: 16.9 10*3/uL — ABNORMAL HIGH (ref 4.0–10.5)
nRBC: 0 % (ref 0.0–0.2)

## 2021-06-19 LAB — ETHANOL: Alcohol, Ethyl (B): <10 mg/dL (ref <10)

## 2021-06-19 LAB — TSH: TSH: 0.54 u[IU]/mL (ref 0.350–4.500)

## 2021-06-19 MED ORDER — BENZTROPINE MESYLATE 1 MG PO TABS: 0.5000 mg | ORAL_TABLET | Freq: Every day | ORAL | Status: DC

## 2021-06-19 MED ORDER — SODIUM CHLORIDE 0.9 % IV BOLUS
1000.0000 mL | Freq: Once | INTRAVENOUS | Status: AC
  Administered 2021-06-19: 1000 mL via INTRAVENOUS

## 2021-06-19 MED ORDER — RISPERIDONE 1 MG PO TABS
1.0000 mg | ORAL_TABLET | Freq: Two times a day (BID) | ORAL | Status: DC
  Filled 2021-06-19: qty 1

## 2021-06-19 MED ORDER — TRAZODONE HCL 50 MG PO TABS
50.0000 mg | ORAL_TABLET | Freq: Every day | ORAL | Status: DC
  Filled 2021-06-19: qty 1

## 2021-06-19 MED ORDER — METOPROLOL TARTRATE 25 MG PO TABS
25.0000 mg | ORAL_TABLET | Freq: Two times a day (BID) | ORAL | Status: DC
  Filled 2021-06-19: qty 1

## 2021-06-19 NOTE — ED Notes (Signed)
IVC prior to arrival/ Consult completed/ Pending placement

## 2021-06-19 NOTE — ED Notes (Signed)
Added belongings: purple phone, cigarettes, and white lighter.

## 2021-06-19 NOTE — BH Assessment (Signed)
Comprehensive Clinical Assessment (CCA) Note  06/19/2021 Billey Chang [id number]  Chief Complaint: No chief complaint on file.  Visit Diagnosis: Bipolar I disorder, current episode mixed, with psychotic features (HCC)  Tricia Kerr is a68 year old female who presents to the ER because her husband is concerned about her current mental and emotional state. Patient was recently inpatient with [facility] for similar presentation. Patient currently believes her children are no longer belongings to her. As well as her husband are no longer together.  CCA Screening, Triage and Referral (STR)  Patient Reported Information How did you hear about [place]? Family/Friend  Referral name: No data recorded Referral phone number: No data recorded  Whom do you see for routine medical problems? No data recorded Practice/Facility Name: No data recorded Practice/Facility Phone Number: No data recorded Name of Contact: No data recorded Contact Number: No data recorded Contact Fax Number: No data recorded Prescriber Name: No data recorded Prescriber Address (if known): No data recorded  What Is the Reason for Your Visit/Call Today? Patient is psychotic and delusional  How Long Has This Been Causing You Problems? > than 6 months  What Do You Feel Would Help You the Most Today? Treatment for Depression or other mood problem   Have You Recently Been in Any Inpatient Treatment (Hospital/Detox/Crisis Center/28-Day Program)? No data recorded Name/Location of Program/Hospital:No data recorded How Long Were You There? No data recorded When Were You Discharged? No data recorded  Have You Ever Received Services From [facility] Before? No data recorded Who Do You See at [facility]? No data recorded  Have You Recently Had Any Thoughts About Hurting Yourself? No  Are You Planning to Commit Suicide/Harm Yourself At This time? No   Have you Recently Had Thoughts About Hurting Someone Tricia Kerr?  No  Explanation: No data recorded  Have You Used Any Alcohol or Drugs in the Past 24 Hours? No  How Long Ago Did You Use Drugs or Alcohol? No data recorded What Did You Use and How Much? [place]   Do You Currently Have a Therapist/Psychiatrist? No  Name of Therapist/Psychiatrist: No data recorded  Have You Been Recently Discharged From Any Office Practice or Programs? No  Explanation of Discharge From Practice/Program: No data recorded    CCA Screening Triage Referral Assessment Type of Contact: Face-to-Face  Is this Initial or Reassessment? No data recorded Date Telepsych consult ordered in [facility]:  No data recorded Time Telepsych consult ordered in [facility]:  No data recorded  Patient Reported Information Reviewed? No data recorded Patient Left Without Being Seen? No data recorded Reason for Not Completing Assessment: No data recorded  Collateral Involvement: No data recorded  Does Patient Have a Court Appointed Legal Guardian? No data recorded Name and Contact of Legal Guardian: No data recorded If Minor and Not Living with Parent(s), Who has Custody? No data recorded Is CPS involved or ever been involved? Never  Is APS involved or ever been involved? Never   Patient Determined To Be At Risk for Harm To Self or Others Based on Review of Patient Reported Information or Presenting Complaint? No  Method: No data recorded Availability of Means: No data recorded Intent: No data recorded Notification Required: No data recorded Additional Information for Danger to Others Potential: No data recorded Additional Comments for Danger to Others Potential: No data recorded Are There Guns or Other Weapons in Your Home? No data recorded Types of Guns/Weapons: No data recorded Are These Weapons Safely Secured?  No data recorded Who Could Verify You Are Able To Have These Secured: No data recorded Do You Have any Outstanding Charges, Pending Court Dates,  Parole/Probation? No data recorded Contacted To Inform of Risk of Harm To Self or Others: No data recorded  Location of Assessment: [facility] ED   Does Patient Present under Involuntary Commitment? Yes  IVC Papers Initial File Date: 05/31/21   [place] of Residence: [place]   Patient Currently Receiving the Following Services: No data recorded  Determination of Need: Emergent (2 hours)   Options For Referral: No data recorded    CCA Biopsychosocial Intake/Chief Complaint:  No data recorded Current Symptoms/Problems: No data recorded  Patient Reported Schizophrenia/Schizoaffective Diagnosis in Past: No   Strengths: Have support, able to take care of her basic needs and have a family.  Preferences: No data recorded Abilities: No data recorded  Type of Services Patient Feels are Needed: No data recorded  Initial Clinical Notes/Concerns: No data recorded  Mental Health Symptoms Depression:   None   Duration of Depressive symptoms: No data recorded  Mania:   Change in energy/activity; Racing thoughts   Anxiety:    Restlessness; Sleep; Worrying   Psychosis:   Delusions   Duration of Psychotic symptoms:  Greater than six months   Trauma:   None   Obsessions:   None   Compulsions:   Poor Insight; Disrupts with routine/functioning; "Driven" to perform behaviors/acts   Inattention:   Does not seem to listen   Hyperactivity/Impulsivity:   Difficulty waiting turn; Feeling of restlessness; Fidgets with hands/feet; Hard time playing/leisure activities quietly   Oppositional/Defiant Behaviors:   None   Emotional Irregularity:   Transient, stress-related paranoia/disassociation   Other Mood/Personality Symptoms:  No data recorded   Mental Status Exam Appearance and self-care  Stature:   Average   Weight:   Average weight   Clothing:   Casual   Grooming:   Normal   Cosmetic use:   Age appropriate   Posture/gait:   Normal   Motor activity:    Not Remarkable   Sensorium  Attention:   Normal   Concentration:   Focuses on irrelevancies; Preoccupied   Orientation:   X5   Recall/memory:   Normal   Affect and Mood  Affect:   Appropriate   Mood:   Irritable   Relating  Eye contact:   Normal   Facial expression:   Anxious; Tense   Attitude toward examiner:   Cooperative   Thought and Language  Speech flow:  Normal   Thought content:   Suspicious; Personalizations   Preoccupation:   Phobias   Hallucinations:   None   Organization:  No data recorded  [employer] of Knowledge:   Fair   Intelligence:   Average   Abstraction:   Concrete   Judgement:   Fair; Impaired   Reality Testing:   Distorted   Insight:   Fair   Decision Making:   Impulsive; Confused   Social Functioning  Social Maturity:   Impulsive; Irresponsible   Social Judgement:   Impropriety   Stress  Stressors:   Relationship; Family conflict   Coping Ability:   Exhausted   Skill Deficits:   None   Supports:   Family     Religion: Religion/Spirituality Are You A Religious Person?: No  Leisure/Recreation: Leisure / Recreation Do You Have Hobbies?: No  Exercise/Diet: Exercise/Diet Do You Exercise?: No Have You Gained or Lost A Significant Amount of Weight in the Past  Six Months?: No Do You Follow a Special Diet?: No Do You Have Any Trouble Sleeping?: No   CCA Employment/Education Employment/Work Situation:    Education: Education Is Patient Currently Attending School?: No Did You Product manager?: No Did You Have An Individualized Education Program (IIEP): No Did You Have Any Difficulty At School?: No Patient's Education Has Been Impacted by Current Illness: No   CCA Family/Childhood History Family and Relationship History: Family history Marital status: Married Does patient have children?: Yes  Childhood History:  Childhood History By whom was/is the patient raised?:  Both parents Did patient suffer any verbal/emotional/physical/sexual abuse as a child?: No Did patient suffer from severe childhood neglect?: No Has patient ever been sexually abused/assaulted/raped as an adolescent or adult?: No Was the patient ever a victim of a crime or a disaster?: No Spoken with a professional about abuse?: No Does patient feel these issues are resolved?: No Witnessed domestic violence?: No Has patient been affected by domestic violence as an adult?: No  Child/Adolescent Assessment:  CCA Substance Use Alcohol/Drug Use: Alcohol / Drug Use Pain Medications: See PTA Prescriptions: See PTA Over the Counter: See PTA History of alcohol / drug use?: No history of alcohol / drug abuse  ASAM's:  Six Dimensions of Multidimensional Assessment  Dimension 1:  Acute Intoxication and/or Withdrawal Potential:      Dimension 2:  Biomedical Conditions and Complications:      Dimension 3:  Emotional, Behavioral, or Cognitive Conditions and Complications:     Dimension 4:  Readiness to Change:     Dimension 5:  Relapse, Continued use, or Continued Problem Potential:     Dimension 6:  Recovery/Living Environment:     ASAM Severity Score:    ASAM Recommended Level of Treatment:     Substance use Disorder (SUD)    Recommendations for Services/Supports/Treatments:    DSM5 Diagnoses: Patient Active Problem List   Diagnosis Date Noted   Bipolar affective disorder, current episode mixed, without psychotic features (HCC) 06/19/2021   Bipolar I disorder, single manic episode, severe, with psychosis (HCC) 06/01/2021   Intractable nausea and vomiting 03/20/2017   Tobacco use disorder 08/23/2015   Cannabis use disorder, severe, dependence (HCC) 08/23/2015    Patient Centered Plan: Patient is on the following Treatment Plan(s):  Bipolar I disorder, current episode mixed, with psychotic features (HCC)   Referrals to Alternative Service(s): Referred to Alternative Service(s):    Place:   Date:   Time:    Referred to Alternative Service(s):   Place:   Date:   Time:    Referred to Alternative Service(s):   Place:   Date:   Time:    Referred to Alternative Service(s):   Place:   Date:   Time:     Lilyan Gilford MS, LCAS, [facility], [facility] Therapeutic Triage Specialist 06/19/2021 6:07 PM

## 2021-06-19 NOTE — ED Notes (Signed)
Patient refused night time medications and covid swab at this time even after explanation for these given to patient.

## 2021-06-19 NOTE — ED Notes (Signed)
Pt given lunch tray.

## 2021-06-19 NOTE — ED Notes (Signed)
Psych NP at bedside

## 2021-06-19 NOTE — ED Notes (Signed)
Pt comes from [facility] under IVC. [facility] was going to keep pt but HR was 128 for sustained amount of time.

## 2021-06-19 NOTE — ED Provider Notes (Signed)
Heart rate comes down to 90 with IV fluids.  TSH and urinalysis are still pending.   Arnaldo Natal, MD 06/19/21 [phone]
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
## 2021-06-19 NOTE — Consult Note (Signed)
[facility] Face-to-Face Psychiatry Consult   Reason for Consult: Consult for 35 year old woman with a history of past episodes of psychosis who presents with recent worsening of psychotic symptoms Referring Physician: Emergency Department Physician Patient Identification: Tricia Kerr MRN:  [id number] Principal Diagnosis:  Bipolar I disorder, current episode mixed, with psychotic features (HCC) Diagnosis:  Bipolar I disorder, current episode mixed, with psychotic features (HCC)  Total Time spent with patient: 1 hour  Subjective:   Tricia Kerr is a 35 y.o. female patient admitted with IVC with concerns for "finding out my kids are not from my eggs."  HPI: Patient comes to the Emergency Department with IVC paperwork presenting with paranoid delusions and bizarre behavior. Patient states that she found out her children "are not her eggs," and has concerns that they "signed paperwork with social services so her children are no longer hers." Also states her husband and her are divorced; per collateral with husband, they are not divorced and have not discussed separation. Patient with tangential thinking, and paranoid the hospital will "call other people" to report her. Despite tangential content, patient speaks in a calm and cooperative manner, denies hallucinations or delusions (revealing limited insight), denies suicidal or homicidal ideation. Does state partaking in marijuana when her "ex-husband [place]" picks it up. States occasional drinking; denies problems with sleep or appetite. She states she is not taking any medications.  Meets psychiatric criteria for admission.  Past Psychiatric History: Past history of prior episodes of psychosis with recent hospitalization in 2016.  Recovered at that time with Risperdal. Indicates that she did follow up at least for some period of time with [facility] but then discontinued treatment.  Prior to that there are reports in the chart that she  had hospitalizations in her younger years.  The pattern suggests likely bipolar disorder with recurrent psychotic episodes  Risk to Self:  Yes Risk to Others:  No Prior Inpatient Therapy:  Yes Prior Outpatient Therapy:  Yes  Past Medical History:  Past Medical History:  Diagnosis Date   Anxiety    Bipolar disorder (HCC)     Past Surgical History:  Procedure Laterality Date   APPENDECTOMY     TUBAL LIGATION     tubial     Family History: No family history on file. Family Psychiatric  History: None known Social History:  Social History   Substance and Sexual Activity  Alcohol Use Yes     Social History   Substance and Sexual Activity  Drug Use Yes   Types: Marijuana   Comment: months    Social History   Socioeconomic History   Marital status: Divorced    Spouse name: Not on file   Number of children: Not on file   Years of education: Not on file   Highest education level: Not on file  Occupational History   Not on file  Tobacco Use   Smoking status: Every Day    Packs/day: 1.00    Pack years: 0.00    Types: Cigarettes   Smokeless tobacco: Never  Substance and Sexual Activity   Alcohol use: Yes   Drug use: Yes    Types: Marijuana    Comment: months   Sexual activity: Not Currently  Other Topics Concern   Not on file  Social History Narrative   Not on file   Social Determinants of Health   Financial Resource Strain: Not on file  Food Insecurity: Not on file  Transportation Needs: Not on file  Physical Activity:  Not on file  Stress: Not on file  Social Connections: Not on file   Additional Social History:    Allergies:   Allergies  Allergen Reactions   Nicotine Anaphylaxis    Nicotine patches     Labs:  Results for orders placed or performed during the hospital encounter of 06/19/21 (from the past 48 hour(s))  Comprehensive metabolic panel     Status: Abnormal   Collection Time: 06/19/21 12:20 PM  Result Value Ref Range   Sodium 138 135  - 145 mmol/L   Potassium 3.2 (L) 3.5 - 5.1 mmol/L   Chloride 106 98 - 111 mmol/L   CO2 24 22 - 32 mmol/L   Glucose, Bld 198 (H) 70 - 99 mg/dL    Comment: Glucose reference range applies only to samples taken after fasting for at least 8 hours.   BUN 9 6 - 20 mg/dL   Creatinine, Ser 1.610.65 0.44 - 1.00 mg/dL   Calcium 9.1 8.9 - 09.610.3 mg/dL   Total Protein 7.3 6.5 - 8.1 g/dL   Albumin 4.0 3.5 - 5.0 g/dL   AST 25 15 - 41 U/L   ALT 27 0 - 44 U/L   Alkaline Phosphatase 82 38 - 126 U/L   Total Bilirubin 0.9 0.3 - 1.2 mg/dL   GFR, Estimated >04>60 >54>60 mL/min    Comment: (NOTE) Calculated using the CKD-EPI Creatinine Equation (2021)    Anion gap 8 5 - 15    Comment: Performed at [facility], [address] [place], [place] [address]  Ethanol     Status: None   Collection Time: 06/19/21 12:20 PM  Result Value Ref Range   Alcohol, Ethyl (B) <10 <10 mg/dL    Comment: (NOTE) Lowest detectable limit for serum alcohol is 10 mg/dL.  For medical purposes only. Performed at [facility], [address] [place], [place] [address]   cbc     Status: Abnormal   Collection Time: 06/19/21 12:20 PM  Result Value Ref Range   WBC 16.9 (H) 4.0 - 10.5 K/uL   RBC 4.57 3.87 - 5.11 MIL/uL   Hemoglobin 13.9 12.0 - 15.0 g/dL   HCT 29.539.9 62.136.0 - 30.846.0 %   MCV 87.3 80.0 - 100.0 fL   MCH 30.4 26.0 - 34.0 pg   MCHC 34.8 30.0 - 36.0 g/dL   RDW 65.713.6 84.611.5 - 96.215.5 %   Platelets 383 150 - 400 K/uL   nRBC 0.0 0.0 - 0.2 %    Comment: Performed at [facility], [address] [place], [place] [address]    Current Facility-Administered Medications  Medication Dose Route Frequency Provider Last Rate Last Admin   sodium chloride 0.9 % bolus 1,000 mL  1,000 mL Intravenous Once Phineas SemenGoodman, Graydon, MD       Current Outpatient Medications  Medication Sig Dispense Refill   benztropine (COGENTIN) 0.5 MG tablet Take 1 tablet (0.5 mg total) by mouth at bedtime. 30 tablet 1   metoprolol tartrate  (LOPRESSOR) 25 MG tablet Take 1 tablet (25 mg total) by mouth 2 (two) times daily. 60 tablet 1   OLANZapine (ZYPREXA) 15 MG tablet Take 1 tablet (15 mg total) by mouth at bedtime. 30 tablet 1    Musculoskeletal: Strength & Muscle Tone: within normal limits Gait & Station: normal Patient leans: N/A  Psychiatric Specialty Exam: Physical Exam Constitutional:      Appearance: Normal appearance.  HENT:     Head: Normocephalic and atraumatic.  Mouth/Throat:     Pharynx: Oropharynx is clear.  Eyes:     Pupils: Pupils are equal, round, and reactive to light.  Cardiovascular:     Rate and Rhythm: Normal rate and regular rhythm.  Pulmonary:     Effort: Pulmonary effort is normal.     Breath sounds: Normal breath sounds.  Abdominal:     General: Abdomen is flat.     Palpations: Abdomen is soft.  Musculoskeletal:        General: Normal range of motion.  Skin:    General: Skin is warm and dry.  Neurological:     General: No focal deficit present.     Mental Status: She is alert. Mental status is at baseline.  Psychiatric:        Attention and Perception: She is inattentive.        Mood and Affect: Mood normal. Affect is blunt.        Speech: Speech is tangential.        Behavior: Behavior is cooperative.        Thought Content: Thought content is paranoid.        Cognition and Memory: Cognition normal.        Judgment: Judgment is impulsive.    Review of Systems  Constitutional: Negative.   HENT: Negative.    Eyes: Negative.   Respiratory: Negative.    Cardiovascular: Negative.   Gastrointestinal: Negative.   Musculoskeletal: Negative.   Skin: Negative.   Neurological: Negative.   Psychiatric/Behavioral:  The patient is nervous/anxious.    Blood pressure (!) 149/96, pulse 90, temperature 98.4 F (36.9 C), temperature source Oral, resp. rate 16, height 4\' 9"  (1.448 m), weight 68 kg, last menstrual period 06/02/2021, SpO2 99 %.Body mass index is 32.44 kg/m.  General  Appearance: Casual  Eye Contact:  Fair  Speech:  hyperverbal  Volume:  Normal  Mood:  Anxious  Affect:  Blunt  Thought Process:  Descriptions of Associations: Tangential  Orientation:  Full (Time, Place, and Person)  Thought Content:  Delusions, Paranoid Ideation, and Tangential  Suicidal Thoughts:  No  Homicidal Thoughts:  No  Memory:  Immediate;   Poor Recent;   Fair Remote;   Fair  Judgement:  Impaired  Insight:  Lacking  Psychomotor Activity:  Normal  Concentration:  Concentration: Fair and Attention Span: Fair  Recall:  06/04/2021 of Knowledge:  Fair  Language:  Good  Akathisia:  No  Handed:  Right  AIMS (if indicated):     Assets:  Housing Leisure Time Physical Health Resilience Social Support  ADL's:  Intact  Cognition:  Impaired,  Mild  Sleep:        Physical Exam: Physical Exam Constitutional:      Appearance: Normal appearance.  HENT:     Head: Normocephalic and atraumatic.     Mouth/Throat:     Pharynx: Oropharynx is clear.  Eyes:     Pupils: Pupils are equal, round, and reactive to light.  Cardiovascular:     Rate and Rhythm: Normal rate and regular rhythm.  Pulmonary:     Effort: Pulmonary effort is normal.     Breath sounds: Normal breath sounds.  Abdominal:     General: Abdomen is flat.     Palpations: Abdomen is soft.  Musculoskeletal:        General: Normal range of motion.  Skin:    General: Skin is warm and dry.  Neurological:     General: No focal deficit  present.     Mental Status: She is alert. Mental status is at baseline.  Psychiatric:        Attention and Perception: She is inattentive.        Mood and Affect: Mood normal. Affect is blunt.        Speech: Speech is tangential.        Behavior: Behavior is cooperative.        Thought Content: Thought content is paranoid.        Cognition and Memory: Cognition normal.        Judgment: Judgment is impulsive.   Review of Systems  Constitutional: Negative.   HENT: Negative.     Eyes: Negative.   Respiratory: Negative.    Cardiovascular: Negative.   Gastrointestinal: Negative.   Musculoskeletal: Negative.   Skin: Negative.   Neurological: Negative.   Psychiatric/Behavioral:  The patient is nervous/anxious.   Blood pressure (!) 149/96, pulse (!) 126, temperature 98.4 F (36.9 C), temperature source Oral, resp. rate 16, height 4\' 9"  (1.448 m), weight 68 kg, last menstrual period 06/02/2021. Body mass index is 32.44 kg/m.  Treatment Plan Summary: Presents with worsening paranoia presenting concerns for increase in irritability that could result in dangerous behavior in the home. Patient with poor insight; patient minimizing symptoms.  Patients meets criteria for IVC and inpatient treatment.  Medication management and Plan: Bipolar affective disorder, mixed, severe with psychotic features: -Started Risperdal 1 mg BID  Insomnia: -Started Trazodone 50 mg daily at bedtime, repeat once in an hour if not asleep   Disposition: Recommend patient to be admitted to psychiatric inpatient treatment once medically clear.  06/04/2021, NP 06/19/2021 3:50 PM

## 2021-06-19 NOTE — ED Triage Notes (Signed)
Pt brought in by [facility] with IVC paper. Her heart rate is elevated. She reports it is elevated because she is being set up to be admitted and is frustrated. Pt reports that "[place] paid the police to come get her and said lies to get her to be admitted.

## 2021-06-19 NOTE — ED Notes (Signed)
Brown flip flops, tye dye hoodie, bra, black shorts,

## 2021-06-19 NOTE — ED Triage Notes (Signed)
Pt refusing to give blood and urine.

## 2021-06-19 NOTE — ED Notes (Signed)
EDP at bedside to assess pt 

## 2021-06-20 ENCOUNTER — Inpatient Hospital Stay
Admission: AD | Admit: 2021-06-20 | Discharge: 2021-06-30 | DRG: 885 | Disposition: A | Discharge status: Home / Self Care | Payer: 59 | Source: Ambulatory Visit | Attending: Psychiatry | Admitting: Psychiatry

## 2021-06-20 ENCOUNTER — Encounter: Payer: Self-pay | Admitting: Psychiatry

## 2021-06-20 ENCOUNTER — Other Ambulatory Visit: Payer: Self-pay

## 2021-06-20 DIAGNOSIS — I10 Essential (primary) hypertension: Secondary | ICD-10-CM | POA: Diagnosis present

## 2021-06-20 DIAGNOSIS — R000 Tachycardia, unspecified: Secondary | ICD-10-CM | POA: Diagnosis present

## 2021-06-20 DIAGNOSIS — F312 Bipolar disorder, current episode manic severe with psychotic features: Secondary | ICD-10-CM | POA: Diagnosis present

## 2021-06-20 DIAGNOSIS — R112 Nausea with vomiting, unspecified: Secondary | ICD-10-CM | POA: Diagnosis present

## 2021-06-20 DIAGNOSIS — F22 Delusional disorders: Secondary | ICD-10-CM | POA: Diagnosis present

## 2021-06-20 DIAGNOSIS — Z20822 Contact with and (suspected) exposure to covid-19: Secondary | ICD-10-CM | POA: Diagnosis present

## 2021-06-20 DIAGNOSIS — F3160 Bipolar disorder, current episode mixed, unspecified: Secondary | ICD-10-CM | POA: Diagnosis not present

## 2021-06-20 DIAGNOSIS — F17210 Nicotine dependence, cigarettes, uncomplicated: Secondary | ICD-10-CM | POA: Diagnosis present

## 2021-06-20 DIAGNOSIS — E876 Hypokalemia: Secondary | ICD-10-CM | POA: Diagnosis present

## 2021-06-20 DIAGNOSIS — Z9114 Patient's other noncompliance with medication regimen: Secondary | ICD-10-CM

## 2021-06-20 LAB — PREGNANCY, URINE: Preg Test, Ur: NEGATIVE — AB

## 2021-06-20 LAB — POC SARS CORONAVIRUS 2 AG -  ED: SARSCOV2ONAVIRUS 2 AG: NEGATIVE

## 2021-06-20 MED ORDER — OLANZAPINE 5 MG PO TBDP: 15.0000 mg | ORAL_TABLET | Freq: Every day | ORAL | Status: DC

## 2021-06-20 MED ORDER — POTASSIUM CHLORIDE 20 MEQ PO PACK
40.0000 meq | PACK | Freq: Once | ORAL | Status: DC
  Filled 2021-06-20: qty 2

## 2021-06-20 MED ORDER — BENZTROPINE MESYLATE 1 MG PO TABS
0.5000 mg | ORAL_TABLET | Freq: Every day | ORAL | Status: DC
  Administered 2021-06-20 – 2021-06-29 (×9): 0.5 mg via ORAL
  Filled 2021-06-20 (×10): qty 1

## 2021-06-20 MED ORDER — ACETAMINOPHEN 325 MG PO TABS
650.0000 mg | ORAL_TABLET | Freq: Four times a day (QID) | ORAL | Status: DC | PRN
  Administered 2021-06-26 – 2021-06-29 (×2): 650 mg via ORAL
  Filled 2021-06-20 (×2): qty 2

## 2021-06-20 MED ORDER — ZIPRASIDONE MESYLATE 20 MG IM SOLR: 20.0000 mg | Freq: Three times a day (TID) | INTRAMUSCULAR | Status: DC | PRN

## 2021-06-20 MED ORDER — POTASSIUM CHLORIDE CRYS ER 20 MEQ PO TBCR
40.0000 meq | EXTENDED_RELEASE_TABLET | Freq: Once | ORAL | Status: AC
  Administered 2021-06-20: 40 meq via ORAL
  Filled 2021-06-20: qty 2

## 2021-06-20 MED ORDER — OLANZAPINE 5 MG PO TBDP
15.0000 mg | ORAL_TABLET | Freq: Every day | ORAL | Status: DC
  Administered 2021-06-20: 15 mg via ORAL
  Filled 2021-06-20 (×2): qty 3

## 2021-06-20 MED ORDER — METOPROLOL TARTRATE 25 MG PO TABS
25.0000 mg | ORAL_TABLET | Freq: Two times a day (BID) | ORAL | Status: DC
  Administered 2021-06-20 – 2021-06-26 (×8): 25 mg via ORAL
  Filled 2021-06-20 (×10): qty 1

## 2021-06-20 MED ORDER — OLANZAPINE 10 MG IM SOLR
15.0000 mg | Freq: Every day | INTRAMUSCULAR | Status: DC
  Filled 2021-06-20: qty 20

## 2021-06-20 MED ORDER — MAGNESIUM HYDROXIDE 400 MG/5ML PO SUSP: 30.0000 mL | Freq: Every day | ORAL | Status: DC | PRN

## 2021-06-20 MED ORDER — OLANZAPINE 5 MG PO TABS: 15.0000 mg | ORAL_TABLET | Freq: Every day | ORAL | Status: DC

## 2021-06-20 MED ORDER — OLANZAPINE 10 MG IM SOLR: 10.0000 mg | Freq: Four times a day (QID) | INTRAMUSCULAR | Status: DC | PRN

## 2021-06-20 MED ORDER — ALUM & MAG HYDROXIDE-SIMETH 200-200-20 MG/5ML PO SUSP: 30.0000 mL | ORAL | Status: DC | PRN

## 2021-06-20 MED ORDER — OLANZAPINE 5 MG PO TBDP: 10.0000 mg | ORAL_TABLET | Freq: Four times a day (QID) | ORAL | Status: DC | PRN

## 2021-06-20 NOTE — ED Notes (Addendum)
Pt argumentative about COVID swab. Pt refuses initially but then agrees. Swab obtained, pt states "You're not welcome" to RN.

## 2021-06-20 NOTE — Progress Notes (Signed)
[facility] Second Physician Opinion Progress Note for Medication Administration to Non-consenting Patients (For Involuntarily Committed Patients)  Patient: Tricia Kerr Date of Birth: [birth year] MRN: [id number]  Reason for the Medication: The patient, without the benefit of the specific treatment measure, is incapable of participating in any available treatment plan that will give the patient a realistic opportunity of improving the patient's condition.  Consideration of Side Effects: Consideration of the side effects related to the medication plan has been given.  Rationale for Medication Administration: Patient with bipolar disorder with psychotic features is agitated paranoid and delusional.  She has no insight into her condition.  Without medication she is not likely to improve and she continues to be at risk of harm to herself or others from her behavior.    Mordecai Rasmussen, MD 06/20/21  3:26 PM   This documentation is good for (7) seven days from the date of the MD signature. New documentation must be completed every seven (7) days with detailed justification in the medical record if the patient requires continued non-emergent administration of psychotropic medications.

## 2021-06-20 NOTE — ED Notes (Signed)
Psychiatrist at bedside

## 2021-06-20 NOTE — ED Notes (Signed)
Pt inquiring why she is here. Explained to patient that the plan is to keep her in the hospital at this time. Pt becomes verbally aggressive, saying that this hospital is terrible, she is unhappy that she is here and wants to be released. Apologized to patient that she did not like the current plan of treatment and would need to voice her concerns to the rounding psychiatrist today.

## 2021-06-20 NOTE — Progress Notes (Signed)
Patient is acutely agitated and paranoid today. She is tangential in speech but is speaking about her children not being her eggs, and about this hospital calluding with her family and the government to keep her here against her will. She continues to have no insight and states she has no mental illness. Her level of psychosis is unlikely to improve without medications. It is of my opinion that patient would benefit from non-emergent forced medications. Second opinion obtained from Dr. Toni Amend. Will place patient on Zyprexa zydis 15 mg PO or IM. See H&P for full details.

## 2021-06-20 NOTE — Consult Note (Signed)
[facility]   Reason for Consult: Consult follow-up 35 year old woman with bipolar disorder in the emergency room with return of paranoid behavior Referring Physician: Erma HeritageIsaacs Patient Identification: Tricia Kerr Klar MRN:  [id number] Principal Diagnosis: Bipolar affective disorder, current episode mixed, without psychotic features (HCC) Diagnosis:  Principal Problem:   Bipolar affective disorder, current episode mixed, without psychotic features (HCC)   Total Time spent with patient: 30 minutes  Subjective:   Tricia Kerr Weiland is a 35 y.o. female patient admitted with "I think I have already sued this place".  HPI: Patient seen chart reviewed.  35 year old woman with psychotic bipolar disorder was just discharged from the hospital a couple of weeks ago.  Returned once again paranoid psychotic making bizarre statements about her children and husband.  On interview today the patient is rambling in her speech but admits that she has been noncompliant with her medicine.  Denies substance use but still has cannabis positive in her urine drug screen.  Patient is disorganized showing poor insight.  Denies any violent intent.  Past Psychiatric History: History of bipolar disorder with psychosis.  Recent discharge after lengthy hospitalization.  Patient apparently did not follow up with outpatient treatment or medication management  Risk to Self:   Risk to Others:   Prior Inpatient Therapy:   Prior Outpatient Therapy:    Past Medical History:  Past Medical History:  Diagnosis Date   Anxiety    Bipolar disorder [facility](HCC)     Past Surgical History:  Procedure Laterality Date   APPENDECTOMY     TUBAL LIGATION     tubial     Family History: No family history on file. Family Psychiatric  History: See previous Social History:  Social History   Substance and Sexual Activity  Alcohol Use Yes     Social History   Substance and Sexual Activity  Drug Use Yes   Types:  Marijuana   Comment: months    Social History   Socioeconomic History   Marital status: Divorced    Spouse name: Not on file   Number of children: Not on file   Years of education: Not on file   Highest education level: Not on file  Occupational History   Not on file  Tobacco Use   Smoking status: Every Day    Packs/day: 1.00    Pack years: 0.00    Types: Cigarettes   Smokeless tobacco: Never  Substance and Sexual Activity   Alcohol use: Yes   Drug use: Yes    Types: Marijuana    Comment: months   Sexual activity: Not Currently  Other Topics Concern   Not on file  Social History Narrative   Not on file   Social Determinants of Health   Financial Resource Strain: Not on file  Food Insecurity: Not on file  Transportation Needs: Not on file  Physical Activity: Not on file  Stress: Not on file  Social Connections: Not on file   Additional Social History:    Allergies:   Allergies  Allergen Reactions   Nicotine Anaphylaxis    Nicotine patches     Labs:  Results for orders placed or performed during the hospital encounter of 06/19/21 (from the past 48 hour(s))  Comprehensive metabolic panel     Status: Abnormal   Collection Time: 06/19/21 12:20 PM  Result Value Ref Range   Sodium 138 135 - 145 mmol/L   Potassium 3.2 (L) 3.5 - 5.1 mmol/L   Chloride 106 98 - 111  mmol/L   CO2 24 22 - 32 mmol/L   Glucose, Bld 198 (H) 70 - 99 mg/dL    Comment: Glucose reference range applies only to samples taken after fasting for at least 8 hours.   BUN 9 6 - 20 mg/dL   Creatinine, Ser 9.89 0.44 - 1.00 mg/dL   Calcium 9.1 8.9 - 21.1 mg/dL   Total Protein 7.3 6.5 - 8.1 g/dL   Albumin 4.0 3.5 - 5.0 g/dL   AST 25 15 - 41 U/L   ALT 27 0 - 44 U/L   Alkaline Phosphatase 82 38 - 126 U/L   Total Bilirubin 0.9 0.3 - 1.2 mg/dL   GFR, Estimated >94 >17 mL/min    Comment: (NOTE) Calculated using the CKD-EPI Creatinine Equation (2021)    Anion gap 8 5 - 15    Comment: Performed at  [facility], [address] [place], [place] [address]  Ethanol     Status: None   Collection Time: 06/19/21 12:20 PM  Result Value Ref Range   Alcohol, Ethyl (B) <10 <10 mg/dL    Comment: (NOTE) Lowest detectable limit for serum alcohol is 10 mg/dL.  For medical purposes only. Performed at [facility], [address] [place], [place] [address]   cbc     Status: Abnormal   Collection Time: 06/19/21 12:20 PM  Result Value Ref Range   WBC 16.9 (H) 4.0 - 10.5 K/uL   RBC 4.57 3.87 - 5.11 MIL/uL   Hemoglobin 13.9 12.0 - 15.0 g/dL   HCT 63.1 49.7 - 02.6 %   MCV 87.3 80.0 - 100.0 fL   MCH 30.4 26.0 - 34.0 pg   MCHC 34.8 30.0 - 36.0 g/dL   RDW 37.8 58.8 - 50.2 %   Platelets 383 150 - 400 K/uL   nRBC 0.0 0.0 - 0.2 %    Comment: Performed at [facility], [address] [place], [place] [address]  TSH     Status: None   Collection Time: 06/19/21 12:20 PM  Result Value Ref Range   TSH 0.540 0.350 - 4.500 uIU/mL    Comment: Performed by a 3rd Generation assay with a functional sensitivity of <=0.01 uIU/mL. Performed at [facility], [address] [place], [place] [address]   Urine Drug Screen, Qualitative     Status: Abnormal   Collection Time: 06/19/21  9:08 PM  Result Value Ref Range   Tricyclic, Ur Screen NONE DETECTED NONE DETECTED   Amphetamines, Ur Screen NONE DETECTED NONE DETECTED   MDMA (Ecstasy)Ur Screen NONE DETECTED NONE DETECTED   Cocaine Metabolite,Ur [place] NONE DETECTED NONE DETECTED   Opiate, Ur Screen NONE DETECTED NONE DETECTED   Phencyclidine (PCP) Ur S NONE DETECTED NONE DETECTED   Cannabinoid 50 Ng, Ur [place] POSITIVE (A) NONE DETECTED   Barbiturates, Ur Screen NONE DETECTED NONE DETECTED   Benzodiazepine, Ur Scrn NONE DETECTED NONE DETECTED   Methadone Scn, Ur NONE DETECTED NONE DETECTED    Comment: (NOTE) Tricyclics + metabolites, urine    Cutoff 1000 ng/mL Amphetamines + metabolites, urine  Cutoff 1000 ng/mL MDMA  (Ecstasy), urine              Cutoff 500 ng/mL Cocaine Metabolite, urine          Cutoff 300 ng/mL Opiate + metabolites, urine        Cutoff 300 ng/mL Phencyclidine (PCP), urine         Cutoff 25 ng/mL Cannabinoid, urine  Cutoff 50 ng/mL Barbiturates + metabolites, urine  Cutoff 200 ng/mL Benzodiazepine, urine              Cutoff 200 ng/mL Methadone, urine                   Cutoff 300 ng/mL  The urine drug screen provides only a preliminary, unconfirmed analytical test result and should not be used for non-medical purposes. Clinical consideration and professional judgment should be applied to any positive drug screen result due to possible interfering substances. A more specific alternate chemical method must be used in order to obtain a confirmed analytical result. Gas chromatography / mass spectrometry (GC/MS) is the preferred confirm atory method. Performed at [facility], [address] [place], [place] [address]   Urinalysis, Complete w Microscopic     Status: Abnormal   Collection Time: 06/19/21  9:08 PM  Result Value Ref Range   Color, Urine YELLOW (A) YELLOW   APPearance CLOUDY (A) CLEAR   Specific Gravity, Urine 1.016 1.005 - 1.030   pH 6.0 5.0 - 8.0   Glucose, UA NEGATIVE NEGATIVE mg/dL   Hgb urine dipstick NEGATIVE NEGATIVE   Bilirubin Urine NEGATIVE NEGATIVE   Ketones, ur NEGATIVE NEGATIVE mg/dL   Protein, ur NEGATIVE NEGATIVE mg/dL   Nitrite NEGATIVE NEGATIVE   Leukocytes,Ua NEGATIVE NEGATIVE   RBC / HPF 0-5 0 - 5 RBC/hpf   WBC, UA 0-5 0 - 5 WBC/hpf   Bacteria, UA RARE (A) NONE SEEN   Squamous Epithelial / LPF 0-5 0 - 5   Mucus PRESENT    Amorphous Crystal PRESENT     Comment: Performed at [facility], [address] [place], [place] [address]    Current Facility-Administered Medications  Medication Dose Route Frequency Provider Last Rate Last Admin   metoprolol tartrate (LOPRESSOR) tablet 25 mg  25 mg Oral BID Charm Rings, NP       OLANZapine (ZYPREXA) tablet 15 mg  15 mg Oral QHS Janny Crute, Jackquline Denmark, MD       Current Outpatient Medications  Medication Sig Dispense Refill   benztropine (COGENTIN) 0.5 MG tablet Take 1 tablet (0.5 mg total) by mouth at bedtime. 30 tablet 1   metoprolol tartrate (LOPRESSOR) 25 MG tablet Take 1 tablet (25 mg total) by mouth 2 (two) times daily. 60 tablet 1   OLANZapine (ZYPREXA) 15 MG tablet Take 1 tablet (15 mg total) by mouth at bedtime. 30 tablet 1    Musculoskeletal: Strength & Muscle Tone: within normal limits Gait & Station: normal Patient leans: N/A            Psychiatric Specialty Exam:  Presentation  General Appearance: Casual; Appropriate for Environment  Eye Contact:Good  Speech:Normal Rate  Speech Volume:Normal  Handedness:Right   Mood and Affect  Mood:Euthymic  Affect:Congruent   Thought Process  Thought Processes:Coherent; Goal Directed  Descriptions of Associations:Intact  Orientation:Full (Time, Place and Person)  Thought Content:Logical  History of Schizophrenia/Schizoaffective disorder:No  Duration of Psychotic Symptoms:Greater than six months  Hallucinations:No data recorded Ideas of Reference:None  Suicidal Thoughts:No data recorded Homicidal Thoughts:No data recorded  Sensorium  Memory:Immediate Fair; Recent Fair; Remote Fair  Judgment:Intact  Insight:Shallow   Executive Functions  Concentration:Good  Attention Span:Good  Recall:Good  Fund of Knowledge:Good  Language:Good   Psychomotor Activity  Psychomotor Activity: No data recorded  Assets  Assets:Communication Skills; Desire for Improvement; Housing; Intimacy; Leisure Time; Physical Health; Social Support; Talents/Skills   Sleep  Sleep:  No data recorded  Physical Exam: Physical Exam Vitals and nursing note reviewed.  Constitutional:      Appearance: Normal appearance.  HENT:     Head: Normocephalic and atraumatic.      Mouth/Throat:     Pharynx: Oropharynx is clear.  Eyes:     Pupils: Pupils are equal, round, and reactive to light.  Cardiovascular:     Rate and Rhythm: Normal rate and regular rhythm.  Pulmonary:     Effort: Pulmonary effort is normal.     Breath sounds: Normal breath sounds.  Abdominal:     General: Abdomen is flat.     Palpations: Abdomen is soft.  Musculoskeletal:        General: Normal range of motion.  Skin:    General: Skin is warm and dry.  Neurological:     General: No focal deficit present.     Mental Status: She is alert. Mental status is at baseline.  Psychiatric:        Attention and Perception: She is inattentive.        Mood and Affect: Mood normal. Affect is labile and inappropriate.        Speech: Speech is tangential.        Behavior: Behavior is agitated. Behavior is not aggressive.        Thought Content: Thought content is paranoid and delusional.        Cognition and Memory: Cognition is impaired. Memory is impaired.        Judgment: Judgment is inappropriate.   Review of Systems  Constitutional: Negative.   HENT: Negative.    Eyes: Negative.   Respiratory: Negative.    Cardiovascular: Negative.   Gastrointestinal: Negative.   Musculoskeletal: Negative.   Skin: Negative.   Neurological: Negative.   Psychiatric/Behavioral:  Negative for depression, hallucinations, substance abuse and suicidal ideas. The patient is nervous/anxious and has insomnia.   Blood pressure 137/81, pulse 91, temperature 98.2 F (36.8 C), resp. rate 16, height 4\' 9"  (1.448 m), weight 68 kg, last menstrual period 06/02/2021, SpO2 99 %. Body mass index is 32.44 kg/m.  Treatment Plan Summary: Medication management and Plan reviewed medication and changed from Risperdal to Zyprexa which had been her discharge medication last time.  Labs otherwise reviewed.  COVID test will try to be repeated.  Plan on admission to the inpatient psychiatric ward upholding involuntary commitment.  Case  reviewed with emergency room physician and staff.  Case reviewed with team downstairs.  Disposition: Recommend psychiatric Inpatient admission when medically cleared.  06/04/2021, MD 06/20/2021 12:03 PM

## 2021-06-20 NOTE — ED Provider Notes (Signed)
Emergency Medicine Observation Re-evaluation Note  Tricia Kerr is a 35 y.o. female, seen on rounds today.  Pt initially presented to the ED for complaints of No chief complaint on file.  Currently, the patient is resting.  Physical Exam  Blood pressure 122/81, pulse (!) 103, temperature 98.2 F (36.8 C), resp. rate 17, height 4\' 9"  (1.448 m), weight 68 kg, last menstrual period 06/02/2021, SpO2 98 %.  Physical Exam Gen: No acute distress  Resp: Normal rise and fall of chest Neuro: Moving all four extremities Psych: Resting currently, calm and cooperative when awake     ED Course / MDM     I have reviewed the labs performed to date as well as medications administered while in observation.  Recent changes in the last 24 hours include HR coming down after fluids/UA and TSH normal   Plan   Current plan is to continue to wait for psych plan/placement if felt warranted Patient is under full IVC at this time.   06/04/2021, MD 06/20/21 [phone]

## 2021-06-20 NOTE — H&P (Signed)
Psychiatric Admission Assessment Adult  Patient Identification: Tricia Kerr MRN:  [id number] Date of Evaluation:  06/20/2021 Chief Complaint:  Bipolar affective disorder, current episode manic with psychotic symptoms (HCC) [F31.2] Principal Diagnosis: Bipolar affective disorder, current episode manic with psychotic symptoms (HCC) Diagnosis:  Principal Problem:   Bipolar affective disorder, current episode manic with psychotic symptoms (HCC)  CC: "I'm here because Tricia Kerr Kemple took a video of me standing by the door doing sign language. There is no reason for me to be here."  History of Present Illness: Tricia Kerr is a 35 year old female with a history of bipolar disorder with psychotic features.  She was admitted involuntarily, presenting to the ED via police.  Patient's husband pettioned for involuntary commitment due to her threatening behaviors and delusions.  Patient was brought to the ED from our [facility]. Patient was discharged from this hospital on 06/02/2021 after being admitted for similar presentation and has been noncompliant with medications since discharge.  On evaluation, patient has pressured speech and is talking in tangential terms.  She exhibits flight of ideas.  She states that she has told her husband that she would buy him house if he would just leave her alone.  She insists that she has been divorced since 2017.  She states that she has 3 children that she carried, but the eggs are not hers.  She believes that her husband and his family are conspiring against her.  She is difficult to get adequate history from. She states "I am not going to take medication because it makes me throw up.  There is no reason for me to be here, I do not need medication.  This is all a fraud."  Patient only admits to being "frustrated."  She denies auditory or visual hallucinations or paranoid thinking.  She denies suicidal or homicidal ideations.  She expresses frustration at her "ex-husband, Tricia Kerr"  whom she blames for her being hospitalized.  Patient exhibits limited to no insight as to her illness.   Collateral from Patient's husband, Tricia Kerr [phone]), who states that patient was of relatively stable from 2017 until last month when she was hospitalized.  He states "I really love her, I just want her to get help."  Husband states that patient has threatened to harm him and threatened to burn the house down.  States that there are "evil spirits" in the house.  Husband states that she takes some medicine but not the antipsychotics.   Associated Signs/Symptoms: Depression Symptoms:  psychomotor agitation, Duration of Depression Symptoms: No data recorded (Hypo) Manic Symptoms:  Delusions, Distractibility, Elevated Mood, Flight of Ideas, Irritable Mood, Anxiety Symptoms:   no Psychotic Symptoms:  Delusions, Paranoia, PTSD Symptoms: Negative Total Time spent with patient: 1 hour  Past Psychiatric History: Past history of prior episodes of psychosis.  She has been admitted numerous times since childhood.  During hospital 2016 she was discharged with Risperdal.  She was followed up with [facility] but then discontinued treatment. She was last admitted at this hospital June 2022, stabilized on Zyprexa, 15 mg nightly.  She has not been compliant with her medications.  Is the patient at risk to self? Yes.    Has the patient been a risk to self in the past 6 months? Yes.    Has the patient been a risk to self within the distant past? Yes.    Is the patient a risk to others? Yes.    Has the patient been a risk to others in the past 6  months? Yes.    Has the patient been a risk to others within the distant past? Yes.     Prior Inpatient Therapy:   Prior Outpatient Therapy:    Alcohol Screening: 1. How often do you have a drink containing alcohol?: Monthly or less 2. How many drinks containing alcohol do you have on a typical day when you are drinking?: 1 or 2 3. How often do you have six or  more drinks on one occasion?: Never AUDIT-C Score: 1 4. How often during the last year have you found that you were not able to stop drinking once you had started?: Never 5. How often during the last year have you failed to do what was normally expected from you because of drinking?: Never 6. How often during the last year have you needed a first drink in the morning to get yourself going after a heavy drinking session?: Never 7. How often during the last year have you had a feeling of guilt of remorse after drinking?: Never 8. How often during the last year have you been unable to remember what happened the night before because you had been drinking?: Never 9. Have you or someone else been injured as a result of your drinking?: No 10. Has a relative or friend or a doctor or another health worker been concerned about your drinking or suggested you cut down?: No Alcohol Use Disorder Identification Test Final Score (AUDIT): 1 Substance Abuse History in the last 12 months:  Yes.   Consequences of Substance Abuse: Medical Consequences:  interferes with medication. Can cause additional health problems Legal Consequences:  fines, jail Family Consequences:  family discord Previous Psychotropic Medications: Yes  Psychological Evaluations: No  Past Medical History:  Past Medical History:  Diagnosis Date   Anxiety    Bipolar disorder (HCC)     Past Surgical History:  Procedure Laterality Date   APPENDECTOMY     TUBAL LIGATION     tubial     Family History: History reviewed. No pertinent family history. Family Psychiatric  History: Unknown Tobacco Screening: Have you used any form of tobacco in the last 30 days? (Cigarettes, Smokeless Tobacco, Cigars, and/or Pipes): Patient Refused Screening Social History:  Social History   Substance and Sexual Activity  Alcohol Use Yes     Social History   Substance and Sexual Activity  Drug Use Yes   Types: Marijuana   Comment: months     Additional Social History:        Allergies:   Allergies  Allergen Reactions   Nicotine Anaphylaxis    Nicotine patches    Lab Results:  Results for orders placed or performed during the hospital encounter of 06/19/21 (from the past 48 hour(s))  Comprehensive metabolic panel     Status: Abnormal   Collection Time: 06/19/21 12:20 PM  Result Value Ref Range   Sodium 138 135 - 145 mmol/L   Potassium 3.2 (L) 3.5 - 5.1 mmol/L   Chloride 106 98 - 111 mmol/L   CO2 24 22 - 32 mmol/L   Glucose, Bld 198 (H) 70 - 99 mg/dL    Comment: Glucose reference range applies only to samples taken after fasting for at least 8 hours.   BUN 9 6 - 20 mg/dL   Creatinine, Ser 4.540.65 0.44 - 1.00 mg/dL   Calcium 9.1 8.9 - 09.810.3 mg/dL   Total Protein 7.3 6.5 - 8.1 g/dL   Albumin 4.0 3.5 - 5.0 g/dL   AST 25  15 - 41 U/L   ALT 27 0 - 44 U/L   Alkaline Phosphatase 82 38 - 126 U/L   Total Bilirubin 0.9 0.3 - 1.2 mg/dL   GFR, Estimated >01 >02 mL/min    Comment: (NOTE) Calculated using the CKD-EPI Creatinine Equation (2021)    Anion gap 8 5 - 15    Comment: Performed at [facility], [address] [place], [place] [address]  Ethanol     Status: None   Collection Time: 06/19/21 12:20 PM  Result Value Ref Range   Alcohol, Ethyl (B) <10 <10 mg/dL    Comment: (NOTE) Lowest detectable limit for serum alcohol is 10 mg/dL.  For medical purposes only. Performed at [facility], [address] [place], [place] [address]   cbc     Status: Abnormal   Collection Time: 06/19/21 12:20 PM  Result Value Ref Range   WBC 16.9 (H) 4.0 - 10.5 K/uL   RBC 4.57 3.87 - 5.11 MIL/uL   Hemoglobin 13.9 12.0 - 15.0 g/dL   HCT 47.4 25.9 - 56.3 %   MCV 87.3 80.0 - 100.0 fL   MCH 30.4 26.0 - 34.0 pg   MCHC 34.8 30.0 - 36.0 g/dL   RDW 87.5 64.3 - 32.9 %   Platelets 383 150 - 400 K/uL   nRBC 0.0 0.0 - 0.2 %    Comment: Performed at [facility], [address] [place], [place] [address]  TSH      Status: None   Collection Time: 06/19/21 12:20 PM  Result Value Ref Range   TSH 0.540 0.350 - 4.500 uIU/mL    Comment: Performed by a 3rd Generation assay with a functional sensitivity of <=0.01 uIU/mL. Performed at [facility], [address] [place], [place] [address]   Urine Drug Screen, Qualitative     Status: Abnormal   Collection Time: 06/19/21  9:08 PM  Result Value Ref Range   Tricyclic, Ur Screen NONE DETECTED NONE DETECTED   Amphetamines, Ur Screen NONE DETECTED NONE DETECTED   MDMA (Ecstasy)Ur Screen NONE DETECTED NONE DETECTED   Cocaine Metabolite,Ur [place] NONE DETECTED NONE DETECTED   Opiate, Ur Screen NONE DETECTED NONE DETECTED   Phencyclidine (PCP) Ur S NONE DETECTED NONE DETECTED   Cannabinoid 50 Ng, Ur [place] POSITIVE (A) NONE DETECTED   Barbiturates, Ur Screen NONE DETECTED NONE DETECTED   Benzodiazepine, Ur Scrn NONE DETECTED NONE DETECTED   Methadone Scn, Ur NONE DETECTED NONE DETECTED    Comment: (NOTE) Tricyclics + metabolites, urine    Cutoff 1000 ng/mL Amphetamines + metabolites, urine  Cutoff 1000 ng/mL MDMA (Ecstasy), urine              Cutoff 500 ng/mL Cocaine Metabolite, urine          Cutoff 300 ng/mL Opiate + metabolites, urine        Cutoff 300 ng/mL Phencyclidine (PCP), urine         Cutoff 25 ng/mL Cannabinoid, urine                 Cutoff 50 ng/mL Barbiturates + metabolites, urine  Cutoff 200 ng/mL Benzodiazepine, urine              Cutoff 200 ng/mL Methadone, urine                   Cutoff 300 ng/mL  The urine drug screen provides only a preliminary, unconfirmed analytical test result and should not be used  for non-medical purposes. Clinical consideration and professional judgment should be applied to any positive drug screen result due to possible interfering substances. A more specific alternate chemical method must be used in order to obtain a confirmed analytical result. Gas chromatography / mass spectrometry (GC/MS) is the  preferred confirm atory method. Performed at [facility], [address] [place], [place] [address]   Urinalysis, Complete w Microscopic Urine, Clean Catch     Status: Abnormal   Collection Time: 06/19/21  9:08 PM  Result Value Ref Range   Color, Urine YELLOW (A) YELLOW   APPearance CLOUDY (A) CLEAR   Specific Gravity, Urine 1.016 1.005 - 1.030   pH 6.0 5.0 - 8.0   Glucose, UA NEGATIVE NEGATIVE mg/dL   Hgb urine dipstick NEGATIVE NEGATIVE   Bilirubin Urine NEGATIVE NEGATIVE   Ketones, ur NEGATIVE NEGATIVE mg/dL   Protein, ur NEGATIVE NEGATIVE mg/dL   Nitrite NEGATIVE NEGATIVE   Leukocytes,Ua NEGATIVE NEGATIVE   RBC / HPF 0-5 0 - 5 RBC/hpf   WBC, UA 0-5 0 - 5 WBC/hpf   Bacteria, UA RARE (A) NONE SEEN   Squamous Epithelial / LPF 0-5 0 - 5   Mucus PRESENT    Amorphous Crystal PRESENT     Comment: Performed at [facility], [address] [place], [place] [address]  POC SARS Coronavirus 2 Ag-ED -     Status: None   Collection Time: 06/20/21 12:03 PM  Result Value Ref Range   SARSCOV2ONAVIRUS 2 AG NEGATIVE NEGATIVE    Comment: (NOTE) SARS-CoV-2 antigen NOT DETECTED.   Negative results are presumptive.  Negative results do not preclude SARS-CoV-2 infection and should not be used as the sole basis for treatment or other patient management decisions, including infection  control decisions, particularly in the presence of clinical signs and  symptoms consistent with COVID-19, or in those who have been in contact with the virus.  Negative results must be combined with clinical observations, patient history, and epidemiological information. The expected result is Negative.  Fact Sheet for Patients: https://www.jennings-kim.com/  Fact Sheet for Healthcare Providers: https://alexander-rogers.biz/  This test is not yet approved or cleared by the [place] FDA and  has been authorized for detection and/or diagnosis of SARS-CoV-2  by FDA under an Emergency Use Authorization (EUA).  This EUA will remain in effect (meaning this test can be used) for the duration of  the COV ID-19 declaration under Section 564(b)(1) of the Act, 21 U.S.C. section 360bbb-3(b)(1), unless the authorization is terminated or revoked sooner.    Pregnancy, urine     Status: Abnormal   Collection Time: 06/20/21  2:04 PM  Result Value Ref Range   Preg Test, Ur NEGATIVE (A) NEGATIVE    Comment: Performed at [facility], [address] [place], [place] [address]    Blood Alcohol level:  Lab Results  Component Value Date   [facility] <10 06/19/2021   ETH <10 05/31/2021    Metabolic Disorder Labs:  Lab Results  Component Value Date   HGBA1C 5.5 06/04/2021   MPG 111.15 06/04/2021   No results found for: PROLACTIN Lab Results  Component Value Date   CHOL 170 06/04/2021   TRIG 68 06/04/2021   HDL 41 06/04/2021   CHOLHDL 4.1 06/04/2021   VLDL 14 06/04/2021   LDLCALC 115 (H) 06/04/2021    Current Medications: Current Facility-Administered Medications  Medication Dose Route Frequency Provider Last Rate Last Admin   acetaminophen (TYLENOL) tablet 650 mg  650  mg Oral Q6H PRN Clapacs, Jackquline Denmark, MD       alum & mag hydroxide-simeth (MAALOX/MYLANTA) 200-200-20 MG/5ML suspension 30 mL  30 mL Oral Q4H PRN Clapacs, Jackquline Denmark, MD       benztropine (COGENTIN) tablet 0.5 mg  0.5 mg Oral QHS Clapacs, John T, MD       magnesium hydroxide (MILK OF MAGNESIA) suspension 30 mL  30 mL Oral Daily PRN Clapacs, Jackquline Denmark, MD       metoprolol tartrate (LOPRESSOR) tablet 25 mg  25 mg Oral BID Clapacs, John T, MD   25 mg at 06/20/21 1647   OLANZapine zydis (ZYPREXA) disintegrating tablet 10 mg  10 mg Oral Q6H PRN Vanetta Mulders, NP       Or   OLANZapine (ZYPREXA) injection 10 mg  10 mg Intramuscular Q6H PRN Vanetta Mulders, NP       OLANZapine zydis (ZYPREXA) disintegrating tablet 15 mg  15 mg Oral QHS Jesse Sans, MD       Or   OLANZapine  [facility]) injection 15 mg  15 mg Intramuscular QHS Jesse Sans, MD       ziprasidone (GEODON) injection 20 mg  20 mg Intramuscular Q8H PRN Vanetta Mulders, NP       PTA Medications: Medications Prior to Admission  Medication Sig Dispense Refill Last Dose   benztropine (COGENTIN) 0.5 MG tablet Take 1 tablet (0.5 mg total) by mouth at bedtime. 30 tablet 1    metoprolol tartrate (LOPRESSOR) 25 MG tablet Take 1 tablet (25 mg total) by mouth 2 (two) times daily. 60 tablet 1    OLANZapine (ZYPREXA) 15 MG tablet Take 1 tablet (15 mg total) by mouth at bedtime. 30 tablet 1     Musculoskeletal: Strength & Muscle Tone: within normal limits Gait & Station: normal Patient leans: N/A   Psychiatric Specialty Exam:  Presentation  General Appearance: Casual  Eye Contact:Good  Speech:Clear and Coherent; Pressured  Speech Volume:Normal  Handedness:Right   Mood and Affect  Mood:Angry; Irritable  Affect:Congruent   Thought Process  Thought Processes:Disorganized  Duration of Psychotic Symptoms: Greater than six months  Past Diagnosis of Schizophrenia or Psychoactive disorder: Yes  Descriptions of Associations:Tangential  Orientation:Full (Time, Place and Person)  Thought Content:Paranoid Ideation; Perseveration; Illogical; Delusions  Hallucinations:Hallucinations: None (Denies) Ideas of Reference:Delusions; Paranoia  Suicidal Thoughts:Suicidal Thoughts: No (Denies) Homicidal Thoughts:Homicidal Thoughts: No (Denies)  Sensorium  Memory:Recent Fair; Remote Fair; Immediate Fair  Judgment:Impaired  Insight:Lacking   Executive Functions  Concentration:Poor  Attention Span:Fair  Recall:Poor  Fund of Knowledge:Good  Language:Good   Psychomotor Activity  Psychomotor Activity: Psychomotor Activity: Increased  Assets  Assets:Resilience; Physical Health; Social Support; Health and safety inspector; Housing   Sleep  Sleep: Sleep: Poor   Physical  Exam: Physical Exam Vitals and nursing note reviewed.  HENT:     Head: Normocephalic.     Nose: No congestion or rhinorrhea.  Eyes:     General:        Right eye: No discharge.        Left eye: No discharge.  Cardiovascular:     Rate and Rhythm: Tachycardia present.  Pulmonary:     Effort: Pulmonary effort is normal.  Musculoskeletal:        General: Normal range of motion.     Cervical back: Normal range of motion.  Neurological:     Mental Status: She is oriented to person, place, and time.  Psychiatric:  Mood and Affect: Affect is inappropriate.        Speech: Speech is rapid and pressured and tangential.        Behavior: Behavior is agitated and hyperactive.        Thought Content: Thought content is paranoid and delusional. Thought content does not include homicidal or suicidal ideation. Thought content does not include homicidal or suicidal plan.        Judgment: Judgment is impulsive and inappropriate.   Review of Systems  Reason unable to perform ROS: Does not c/o any pain. Uncooperative with questioning.  Blood pressure (!) 140/95, pulse (!) 115, temperature 98.8 F (37.1 C), temperature source Oral, resp. rate 18, height 4\' 9"  (1.448 m), weight 66.2 kg, last menstrual period 06/02/2021, SpO2 99 %. Body mass index is 31.59 kg/m.  Treatment Plan Summary: Daily contact with patient to assess and evaluate symptoms and progress in treatment and Medication management  Observation Level/Precautions:  15 minute checks  Laboratory:   no additional at this time  Psychotherapy:  Group- psychosocial  Medications:  SEE MAR  Consultations: As indicated   Discharge Concerns:  Safety and stabilization  Estimated LOS: 5-7 days  Other:     Bipolar disorder with psychotic features -Start Zyprexa (Zydis) 15 mg po daily at bedtime OR 15 mg IM -Start Cogentin 0.5 mg daily at bedtime  Tachycardia -Start metoprolol tartrate 25 mg po twice daily  Hypokalemia -potassium  chloride CR tablet 40 mEQ ONCE  PRN agitation -Geodon 20 mg every 8 hr IM -Zyprexa 10 mg every 6 hr po or IM  PRN, Other  -Start Tylenol 650 mg po every 6 hrs prn pain -Start MAALOX/MYLANTA 30 mL po every 4 hrs prn indigestion -Start Milk of Magnesia 30 mL po daily prn constipation      Physician Treatment Plan for Primary Diagnosis: Bipolar affective disorder, current episode manic with psychotic symptoms (HCC) Long Term Goal(s): Improvement in symptoms so as ready for discharge  Short Term Goals: Ability to identify changes in lifestyle to reduce recurrence of condition will improve, Ability to verbalize feelings will improve, Ability to demonstrate self-control will improve, Ability to identify and develop effective coping behaviors will improve, Ability to maintain clinical measurements within normal limits will improve, Compliance with prescribed medications will improve, and Ability to identify triggers associated with substance abuse/mental health issues will improve  Physician Treatment Plan for Secondary Diagnosis: Principal Problem:   Bipolar affective disorder, current episode manic with psychotic symptoms (HCC)  Long Term Goal(s): Improvement in symptoms so as ready for discharge  Short Term Goals: Ability to identify changes in lifestyle to reduce recurrence of condition will improve, Ability to verbalize feelings will improve, Ability to demonstrate self-control will improve, Ability to identify and develop effective coping behaviors will improve, Ability to maintain clinical measurements within normal limits will improve, Compliance with prescribed medications will improve, and Ability to identify triggers associated with substance abuse/mental health issues will improve  I certify that inpatient services furnished can reasonably be expected to improve the patient's condition.    06/04/2021, NP 7/11/20225:30 PM

## 2021-06-20 NOTE — [facility] Suicide Risk Assessment (Signed)
[facility] Admission Suicide Risk Assessment   Nursing information obtained from:    Demographic factors:    Current Mental Status:    Loss Factors:    Historical Factors:    Risk Reduction Factors:     Total Time spent with patient: 30 minutes Principal Problem: Bipolar affective disorder, current episode manic with psychotic symptoms (HCC) Diagnosis:  Principal Problem:   Bipolar affective disorder, current episode manic with psychotic symptoms (HCC)  Subjective Data: Admission for acute psychosis in context of medication non-compliance. See H&P for details.   Continued Clinical Symptoms:  Alcohol Use Disorder Identification Test Final Score (AUDIT): 1 The "Alcohol Use Disorders Identification Test", Guidelines for Use in Primary Care, Second Edition.  World Science writer [facility]). Score between 0-7:  no or low risk or alcohol related problems. Score between 8-15:  moderate risk of alcohol related problems. Score between 16-19:  high risk of alcohol related problems. Score 20 or above:  warrants further diagnostic evaluation for alcohol dependence and treatment.   CLINICAL FACTORS:   Severe Anxiety and/or Agitation Bipolar Disorder:   Mixed State Currently Psychotic Unstable or Poor Therapeutic Relationship Previous Psychiatric Diagnoses and Treatments Medical Diagnoses and Treatments/Surgeries   Musculoskeletal: Strength & Muscle Tone: within normal limits Gait & Station: normal Patient leans: N/A  Psychiatric Specialty Exam:  Presentation  General Appearance: Disheveled  Eye Contact:Minimal  Speech:Pressured  Speech Volume:Increased  Handedness:Right   Mood and Affect  Mood:Angry; Irritable  Affect:Congruent   Thought Process  Thought Processes:Disorganized  Descriptions of Associations:Tangential  Orientation:Full (Time, Place and Person)  Thought Content:Illogical; Delusions; Paranoid Ideation  History of Schizophrenia/Schizoaffective  disorder:Yes  Duration of Psychotic Symptoms:Greater than six months  Hallucinations:Hallucinations: Auditory Ideas of Reference:Paranoia; Delusions; Percusatory  Suicidal Thoughts:Suicidal Thoughts: No Homicidal Thoughts:Homicidal Thoughts: No  Sensorium  Memory:Immediate Poor; Recent Poor; Remote Poor  Judgment:Impaired  Insight:Lacking   Executive Functions  Concentration:Poor  Attention Span:Poor  Recall:Poor  Fund of Knowledge:Fair  Language:Fair   Psychomotor Activity  Psychomotor Activity: Psychomotor Activity: Normal  Assets  Assets:Housing; Intimacy; Social Support   Sleep  Sleep: Sleep: Poor   Physical Exam: Physical Exam ROS Blood pressure (!) 140/95, pulse (!) 115, temperature 98.8 F (37.1 C), temperature source Oral, resp. rate 18, height 4\' 9"  (1.448 m), weight 66.2 kg, last menstrual period 06/02/2021, SpO2 99 %. Body mass index is 31.59 kg/m.   COGNITIVE FEATURES THAT CONTRIBUTE TO RISK:  Closed-mindedness and Loss of executive function    SUICIDE RISK:   Mild:  Suicidal ideation of limited frequency, intensity, duration, and specificity.  There are no identifiable plans, no associated intent, mild dysphoria and related symptoms, good self-control (both objective and subjective assessment), few other risk factors, and identifiable protective factors, including available and accessible social support.  PLAN OF CARE: Continue inpatient admission, see H&P for details.   I certify that inpatient services furnished can reasonably be expected to improve the patient's condition.   06/04/2021, MD 06/20/2021, 3:50 PM

## 2021-06-20 NOTE — Progress Notes (Signed)
Patient is agitated during admission assessment. Speech is tangential and thought process is disorganized. She has no insight and continues to state that she does not need to be here and that her only problem is anxiety. Skin assessment completed with Maryelizabeth Kaufmann, RN. No contraband found. Patient initially refuses vital signs and repeats "I want to do what you did on the 23rd. You were here on the 23rd." Patient will not elaborate what happened on the 23rd. Patient mood and affect is labile. She is irritable at times, then smiles at times. Patient is observed to be using the phones on the unit and yells multiple times, expressing frustration on the phone. Patient was given metoprolol and potassium chloride as scheduled. Patient was educated on the medications. Patient states. "I don't have high blood pressure and they told me my white blood cell count was high". Patient was told that her blood pressure on admission was 140/95  and pulse was 115, and potassium level as of 06/19/21 was 3.2. Patient was compliant with the two medications. Patient remains safe on the unit at this time.

## 2021-06-20 NOTE — ED Notes (Signed)
Refused PO meds

## 2021-06-20 NOTE — Tx Team (Signed)
Initial Treatment Plan 06/20/2021 4:27 PM Billey Chang MBT:[id number]   PATIENT STRESSORS: Medication change or noncompliance   PATIENT STRENGTHS: Physical Health Supportive family/friends   PATIENT IDENTIFIED PROBLEMS: None                     DISCHARGE CRITERIA:  Ability to meet basic life and health needs Improved stabilization in mood, thinking, and/or behavior  PRELIMINARY DISCHARGE PLAN: Return to previous living arrangement  PATIENT/FAMILY INVOLVEMENT: This treatment plan has been presented to and reviewed with the patient, Tricia Kerr. The patient has been given the opportunity to ask questions and make suggestions.  Celene Kras, RN 06/20/2021, 4:27 PM

## 2021-06-21 DIAGNOSIS — F312 Bipolar disorder, current episode manic severe with psychotic features: Secondary | ICD-10-CM | POA: Diagnosis not present

## 2021-06-21 MED ORDER — ONDANSETRON HCL 4 MG PO TABS
8.0000 mg | ORAL_TABLET | Freq: Three times a day (TID) | ORAL | Status: DC | PRN
  Administered 2021-06-21: 8 mg via ORAL
  Filled 2021-06-21: qty 2

## 2021-06-21 NOTE — Progress Notes (Signed)
Patient presents with a fixed smile. Pt pleasant during assessment denies SI/HI/AVH. Patient observed interacting appropriately with staff and peers on the unit. Patient refused night medications, Pt given education but still refused. Patient given education, support and encouragement to be active in her treatment plan. Patient being monitored Q 15 minutes for safety per unit protocol. Pt remains safe on the unit.

## 2021-06-21 NOTE — Plan of Care (Addendum)
Pt rates anxiety 4/10. Pt denies depression, SI, HI and AVH. Pt was educated on care plan and verbalizes understanding. Torrie Mayers RN Problem: Education: Goal: Knowledge of [place] Education information/materials will improve Outcome: Not Progressing Goal: Emotional status will improve Outcome: Not Progressing Goal: Mental status will improve Outcome: Not Progressing Goal: Verbalization of understanding the information provided will improve Outcome: Not Progressing   Problem: Activity: Goal: Interest or engagement in activities will improve Outcome: Not Progressing Goal: Sleeping patterns will improve Outcome: Not Progressing   Problem: Coping: Goal: Ability to verbalize frustrations and anger appropriately will improve Outcome: Progressing Goal: Ability to demonstrate self-control will improve Outcome: Progressing   Problem: Health Behavior/Discharge Planning: Goal: Identification of resources available to assist in meeting health care needs will improve Outcome: Progressing Goal: Compliance with treatment plan for underlying cause of condition will improve Outcome: Progressing   Problem: Physical Regulation: Goal: Ability to maintain clinical measurements within normal limits will improve Outcome: Progressing   Problem: Safety: Goal: Periods of time without injury will increase Outcome: Progressing   Problem: Activity: Goal: Will verbalize the importance of balancing activity with adequate rest periods Outcome: Not Progressing   Problem: Education: Goal: Will be free of psychotic symptoms Outcome: Not Progressing Goal: Knowledge of the prescribed therapeutic regimen will improve Outcome: Not Progressing   Problem: Coping: Goal: Coping ability will improve Outcome: Progressing Goal: Will verbalize feelings Outcome: Progressing   Problem: Health Behavior/Discharge Planning: Goal: Compliance with prescribed medication regimen will improve Outcome:  Progressing   Problem: Nutritional: Goal: Ability to achieve adequate nutritional intake will improve Outcome: Progressing   Problem: Role Relationship: Goal: Ability to communicate needs accurately will improve Outcome: Progressing Goal: Ability to interact with others will improve Outcome: Progressing   Problem: Safety: Goal: Ability to redirect hostility and anger into socially appropriate behaviors will improve Outcome: Progressing Goal: Ability to remain free from injury will improve Outcome: Progressing   Problem: Self-Care: Goal: Ability to participate in self-care as condition permits will improve Outcome: Not Progressing   Problem: Self-Concept: Goal: Will verbalize positive feelings about self Outcome: Not Progressing

## 2021-06-21 NOTE — [facility] Group Notes (Signed)
LCSW Group Therapy Note     06/21/2021 1:50 PM     Type of Therapy/Topic:  Group Therapy:  Feelings about Diagnosis     Participation Level:  Active     Description of Group:   This group will allow patients to explore their thoughts and feelings about diagnoses they have received. Patients will be guided to explore their level of understanding and acceptance of these diagnoses. Facilitator will encourage patients to process their thoughts and feelings about the reactions of others to their diagnosis and will guide patients in identifying ways to discuss their diagnosis with significant others in their lives. This group will be process-oriented, with patients participating in exploration of their own experiences, giving and receiving support, and processing challenge from other group members.        Therapeutic Goals:  1.    Patient will demonstrate understanding of diagnosis as evidenced by identifying two or more symptoms of the disorder  2.    Patient will be able to express two feelings regarding the diagnosis  3.    Patient will demonstrate their ability to communicate their needs through discussion and/or role play     Summary of Patient Progress: Patient was present for the entirety of the group session. Patient was an active listener and participated in the topic of discussion. She stated she does not have any issues and feels that [facility] staff and her "ex-husband" are violating her HIPAA rights. She said that she is ready to go home.    Therapeutic Modalities:   Cognitive Behavioral Therapy  Brief Therapy  Feelings Identification    Dillyn Joaquin [place], MSW, LCSW-A  06/21/2021 1:50 PM

## 2021-06-21 NOTE — Progress Notes (Signed)
Recreation Therapy Notes   Date: 06/21/2021  Time: 10:30 am   Location: Craft Room     Behavioral response: Appropriate   Intervention Topic: Self esteem   Discussion/Intervention:  Group content today was focused on self-esteem. Patient defined self-esteem and where it comes form. The group described reasons self-esteem is important. Individuals stated things that impact self-esteem and positive ways to improve self-esteem. The group participated in the intervention "Collage of Me" where patients were able to create a collage of positive things that makes them who they are. Clinical Observations/Feedback: Patient came to group and was focused on what peers and staff had to say about self-esteem. Participant laughed to herself on and off while participating in the intervention.  Kalkidan Caudell LRT/CTRS         Juell Radney 06/21/2021 12:53 PM

## 2021-06-21 NOTE — BHH Counselor (Signed)
During contact to complete PSA, pt DECLINED scheduled aftercare appointment, stating that she can do that herself.   Vilma Meckel. Algis Greenhouse, MSW, LCSW, LCAS 06/21/2021 3:53 PM

## 2021-06-21 NOTE — Progress Notes (Signed)
Pt says that her hands "are registered, if I hit someone I will kill them". Torrie Mayers RN

## 2021-06-21 NOTE — Plan of Care (Signed)
  Problem: Education: Goal: Knowledge of [place] [facility] Education information/materials will improve Outcome: Progressing Goal: Emotional status will improve Outcome: Progressing Goal: Mental status will improve Outcome: Progressing Goal: Verbalization of understanding the information provided will improve Outcome: Progressing   Problem: Activity: Goal: Interest or engagement in activities will improve Outcome: Progressing Goal: Sleeping patterns will improve Outcome: Progressing   Problem: Coping: Goal: Ability to verbalize frustrations and anger appropriately will improve Outcome: Progressing Goal: Ability to demonstrate self-control will improve Outcome: Progressing   Problem: Health Behavior/Discharge Planning: Goal: Identification of resources available to assist in meeting health care needs will improve Outcome: Progressing Goal: Compliance with treatment plan for underlying cause of condition will improve Outcome: Progressing   Problem: Physical Regulation: Goal: Ability to maintain clinical measurements within normal limits will improve Outcome: Progressing   Problem: Safety: Goal: Periods of time without injury will increase Outcome: Progressing   Problem: Activity: Goal: Will verbalize the importance of balancing activity with adequate rest periods Outcome: Progressing   Problem: Education: Goal: Will be free of psychotic symptoms Outcome: Progressing Goal: Knowledge of the prescribed therapeutic regimen will improve Outcome: Progressing   Problem: Coping: Goal: Coping ability will improve Outcome: Progressing Goal: Will verbalize feelings Outcome: Progressing   Problem: Health Behavior/Discharge Planning: Goal: Compliance with prescribed medication regimen will improve Outcome: Progressing   Problem: Nutritional: Goal: Ability to achieve adequate nutritional intake will improve Outcome: Progressing   Problem: Role Relationship: Goal:  Ability to communicate needs accurately will improve Outcome: Progressing Goal: Ability to interact with others will improve Outcome: Progressing   Problem: Safety: Goal: Ability to redirect hostility and anger into socially appropriate behaviors will improve Outcome: Progressing Goal: Ability to remain free from injury will improve Outcome: Progressing   Problem: Self-Care: Goal: Ability to participate in self-care as condition permits will improve Outcome: Progressing   Problem: Self-Concept: Goal: Will verbalize positive feelings about self Outcome: Progressing   

## 2021-06-21 NOTE — [facility] Suicide Risk Assessment (Signed)
[facility] INPATIENT:  Family/Significant Other Suicide Prevention Education  Suicide Prevention Education:  Patient Refusal for Family/Significant Other Suicide Prevention Education: The patient Tricia Kerr has refused to provide written consent for family/significant other to be provided Family/Significant Other Suicide Prevention Education during admission and/or prior to discharge.  Physician notified.  SPE completed with pt, as pt refused to consent to family contact. SPI pamphlet provided to pt and pt was encouraged to share information with support network, ask questions, and talk about any concerns relating to SPE. Pt denies access to guns/firearms and verbalized understanding of information provided. Mobile Crisis information also provided to pt.  Glenis Smoker 06/21/2021, 3:13 PM

## 2021-06-21 NOTE — Progress Notes (Signed)
[facility] MD Progress Note  06/21/2021 3:40 PM Tricia Kerr  MRN:  [id number]  CC: "I am taking my mental medications that the doctor is giving me."   Subjective:   Tricia Kerr is a 35 year old female with a history of bipolar disorder with psychotic features.  She was admitted involuntarily, presenting to the ED via police.  Patient's husband pettioned for involuntary commitment due to her threatening behaviors and delusions.  Patient was seen today, approached in the hall as she was returning from a group. She showed this NP the collage that she made.  Patient denies auditory or visual hallucinations.  Denies suicidal or homicidal ideations.  She states that her sleep is "okay" and that her appetite is good.  She continues to have the delusion that she has signed over her children to the state and speaks in nonsensical terms regarding her hospitalization and situation.  Patient continues to be tangential and insists that her "ex-husband" keeps "putting her in here and he will not come and get her."  This NP tried to explain to patient that patient's husband cannot just come and pick her up, that she still is under involuntary commitment until she is deemed safe for discharge.  Patient stated that she is "done talking now" and went to sit on her bed.  Per chart review, patient is taking her po medications.  No as needed medications for agitation have been given.  Patient complained of nausea today.  Zofran has been ordered for nausea and vomiting as needed, and she has received 1 dose.     Principal Problem: Bipolar affective disorder, current episode manic with psychotic symptoms (HCC) Diagnosis: Principal Problem:   Bipolar affective disorder, current episode manic with psychotic symptoms (HCC)  Total Time spent with patient: 15 minutes  Past Psychiatric History:  Past history of prior episodes of psychosis.  She has been admitted numerous times since childhood.  During hospital 2016 she was  discharged with Risperdal.  She was followed up with [facility] but then discontinued treatment. She was last admitted at this hospital June 2022, discharged June 23. She was stabilized on Zyprexa, 15 mg nightly.  She has not been compliant with her medications.  Past Medical History:  Past Medical History:  Diagnosis Date   Anxiety    Bipolar disorder [facility])     Past Surgical History:  Procedure Laterality Date   APPENDECTOMY     TUBAL LIGATION     tubial     Family History: History reviewed. No pertinent family history. Family Psychiatric  History: Unknown/Denies Social History:  Social History   Substance and Sexual Activity  Alcohol Use Yes     Social History   Substance and Sexual Activity  Drug Use Yes   Types: Marijuana   Comment: months    Social History   Socioeconomic History   Marital status: Divorced    Spouse name: Not on file   Number of children: Not on file   Years of education: Not on file   Highest education level: Not on file  Occupational History   Not on file  Tobacco Use   Smoking status: Every Day    Packs/day: 1.00    Pack years: 0.00    Types: Cigarettes   Smokeless tobacco: Never  Substance and Sexual Activity   Alcohol use: Yes   Drug use: Yes    Types: Marijuana    Comment: months   Sexual activity: Not Currently  Other Topics Concern   Not  on file  Social History Narrative   Not on file   Social Determinants of Health   Financial Resource Strain: Not on file  Food Insecurity: Not on file  Transportation Needs: Not on file  Physical Activity: Not on file  Stress: Not on file  Social Connections: Not on file   Additional Social History:      Sleep: Fair  Appetite:  Good  Current Medications: Current Facility-Administered Medications  Medication Dose Route Frequency Provider Last Rate Last Admin   acetaminophen (TYLENOL) tablet 650 mg  650 mg Oral Q6H PRN Clapacs, Jackquline Denmark, MD       alum & mag hydroxide-simeth  (MAALOX/MYLANTA) 200-200-20 MG/5ML suspension 30 mL  30 mL Oral Q4H PRN Clapacs, Jackquline Denmark, MD       benztropine (COGENTIN) tablet 0.5 mg  0.5 mg Oral QHS Clapacs, John T, MD   0.5 mg at 06/20/21 2054   magnesium hydroxide (MILK OF MAGNESIA) suspension 30 mL  30 mL Oral Daily PRN Clapacs, Jackquline Denmark, MD       metoprolol tartrate (LOPRESSOR) tablet 25 mg  25 mg Oral BID Clapacs, John T, MD   25 mg at 06/21/21 0728   OLANZapine zydis (ZYPREXA) disintegrating tablet 10 mg  10 mg Oral Q6H PRN Vanetta Mulders, NP       Or   OLANZapine (ZYPREXA) injection 10 mg  10 mg Intramuscular Q6H PRN Vanetta Mulders, NP       OLANZapine zydis (ZYPREXA) disintegrating tablet 15 mg  15 mg Oral QHS Jesse Sans, MD   15 mg at 06/20/21 2054   Or   OLANZapine (ZYPREXA) injection 15 mg  15 mg Intramuscular QHS Jesse Sans, MD       ondansetron [facility]) tablet 8 mg  8 mg Oral Q8H PRN Jesse Sans, MD   8 mg at 06/21/21 1218   ziprasidone (GEODON) injection 20 mg  20 mg Intramuscular Q8H PRN Vanetta Mulders, NP        Lab Results:  Results for orders placed or performed during the hospital encounter of 06/19/21 (from the past 48 hour(s))  Urine Drug Screen, Qualitative     Status: Abnormal   Collection Time: 06/19/21  9:08 PM  Result Value Ref Range   Tricyclic, Ur Screen NONE DETECTED NONE DETECTED   Amphetamines, Ur Screen NONE DETECTED NONE DETECTED   MDMA (Ecstasy)Ur Screen NONE DETECTED NONE DETECTED   Cocaine Metabolite,Ur [place] NONE DETECTED NONE DETECTED   Opiate, Ur Screen NONE DETECTED NONE DETECTED   Phencyclidine (PCP) Ur S NONE DETECTED NONE DETECTED   Cannabinoid 50 Ng, Ur  POSITIVE (A) NONE DETECTED   Barbiturates, Ur Screen NONE DETECTED NONE DETECTED   Benzodiazepine, Ur Scrn NONE DETECTED NONE DETECTED   Methadone Scn, Ur NONE DETECTED NONE DETECTED    Comment: (NOTE) Tricyclics + metabolites, urine    Cutoff 1000 ng/mL Amphetamines + metabolites, urine  Cutoff 1000 ng/mL MDMA  (Ecstasy), urine              Cutoff 500 ng/mL Cocaine Metabolite, urine          Cutoff 300 ng/mL Opiate + metabolites, urine        Cutoff 300 ng/mL Phencyclidine (PCP), urine         Cutoff 25 ng/mL Cannabinoid, urine                 Cutoff 50 ng/mL Barbiturates + metabolites, urine  Cutoff 200 ng/mL  Benzodiazepine, urine              Cutoff 200 ng/mL Methadone, urine                   Cutoff 300 ng/mL  The urine drug screen provides only a preliminary, unconfirmed analytical test result and should not be used for non-medical purposes. Clinical consideration and professional judgment should be applied to any positive drug screen result due to possible interfering substances. A more specific alternate chemical method must be used in order to obtain a confirmed analytical result. Gas chromatography / mass spectrometry (GC/MS) is the preferred confirm atory method. Performed at [facility], [address] [place], [place] [address]   Urinalysis, Complete w Microscopic Urine, Clean Catch     Status: Abnormal   Collection Time: 06/19/21  9:08 PM  Result Value Ref Range   Color, Urine YELLOW (A) YELLOW   APPearance CLOUDY (A) CLEAR   Specific Gravity, Urine 1.016 1.005 - 1.030   pH 6.0 5.0 - 8.0   Glucose, UA NEGATIVE NEGATIVE mg/dL   Hgb urine dipstick NEGATIVE NEGATIVE   Bilirubin Urine NEGATIVE NEGATIVE   Ketones, ur NEGATIVE NEGATIVE mg/dL   Protein, ur NEGATIVE NEGATIVE mg/dL   Nitrite NEGATIVE NEGATIVE   Leukocytes,Ua NEGATIVE NEGATIVE   RBC / HPF 0-5 0 - 5 RBC/hpf   WBC, UA 0-5 0 - 5 WBC/hpf   Bacteria, UA RARE (A) NONE SEEN   Squamous Epithelial / LPF 0-5 0 - 5   Mucus PRESENT    Amorphous Crystal PRESENT     Comment: Performed at [facility], [address] [place], [place] [address]  POC SARS Coronavirus 2 Ag-ED -     Status: None   Collection Time: 06/20/21 12:03 PM  Result Value Ref Range   SARSCOV2ONAVIRUS 2 AG NEGATIVE NEGATIVE     Comment: (NOTE) SARS-CoV-2 antigen NOT DETECTED.   Negative results are presumptive.  Negative results do not preclude SARS-CoV-2 infection and should not be used as the sole basis for treatment or other patient management decisions, including infection  control decisions, particularly in the presence of clinical signs and  symptoms consistent with COVID-19, or in those who have been in contact with the virus.  Negative results must be combined with clinical observations, patient history, and epidemiological information. The expected result is Negative.  Fact Sheet for Patients: https://www.jennings-kim.com/  Fact Sheet for Healthcare Providers: https://alexander-rogers.biz/  This test is not yet approved or cleared by the [place] FDA and  has been authorized for detection and/or diagnosis of SARS-CoV-2 by FDA under an Emergency Use Authorization (EUA).  This EUA will remain in effect (meaning this test can be used) for the duration of  the COV ID-19 declaration under Section 564(b)(1) of the Act, 21 U.S.C. section 360bbb-3(b)(1), unless the authorization is terminated or revoked sooner.    Pregnancy, urine     Status: Abnormal   Collection Time: 06/20/21  2:04 PM  Result Value Ref Range   Preg Test, Ur NEGATIVE (A) NEGATIVE    Comment: Performed at [facility], [address] [place], [place] [address]    Blood Alcohol level:  Lab Results  Component Value Date   [facility] <10 06/19/2021   ETH <10 05/31/2021    Metabolic Disorder Labs: Lab Results  Component Value Date   HGBA1C 5.5 06/04/2021   MPG 111.15 06/04/2021   No results found for: PROLACTIN Lab Results  Component Value Date  CHOL 170 06/04/2021   TRIG 68 06/04/2021   HDL 41 06/04/2021   CHOLHDL 4.1 06/04/2021   VLDL 14 06/04/2021   LDLCALC 115 (H) 06/04/2021    Physical Findings: AIMS:  , ,  ,  ,    CIWA:    COWS:     Musculoskeletal: Strength & Muscle Tone:  within normal limits Gait & Station: normal Patient leans: N/A  Psychiatric Specialty Exam:  Presentation  General Appearance: Appropriate for Environment  Eye Contact:Good  Speech:Clear and Coherent  Speech Volume:Normal  Handedness:Right   Mood and Affect  Mood:Angry; Irritable  Affect:Labile   Thought Process  Thought Processes:Disorganized  Descriptions of Associations:Loose  Orientation:Full (Time, Place and Person)  Thought Content:Perseveration; Illogical; Paranoid Ideation  History of Schizophrenia/Schizoaffective disorder:Yes  Duration of Psychotic Symptoms:Greater than six months  Hallucinations:Hallucinations: None (Denies)  Ideas of Reference:Delusions; Paranoia  Suicidal Thoughts:Suicidal Thoughts: No (Denies)  Homicidal Thoughts:Homicidal Thoughts: No (Denies)   Sensorium  Memory:Recent Fair; Remote Fair; Immediate Fair  Judgment:Impaired  Insight:Lacking   Executive Functions  Concentration:Poor  Attention Span:Fair  Recall:Poor  Fund of Knowledge:Good  Language:Good   Psychomotor Activity  Psychomotor Activity:Psychomotor Activity: Increased   Assets  Assets:Resilience; Physical Health; Social Support; Health and safety inspector; Housing   Sleep  Sleep:Sleep: Fair Number of Hours of Sleep: 7.75    Physical Exam: Physical Exam HENT:     Head: Normocephalic.     Nose: No congestion or rhinorrhea.  Eyes:     General:        Right eye: No discharge.        Left eye: No discharge.  Pulmonary:     Effort: Pulmonary effort is normal.  Musculoskeletal:        General: Normal range of motion.     Cervical back: Normal range of motion.  Neurological:     Mental Status: She is alert and oriented to person, place, and time.  Psychiatric:        Attention and Perception: She is inattentive.        Mood and Affect: Affect is inappropriate.        Speech: Speech is rapid and pressured and tangential.         Behavior: Behavior is hyperactive.        Thought Content: Thought content is paranoid and delusional.        Judgment: Judgment is impulsive and inappropriate.     Comments: Psychotic, manic   Review of Systems  Reason unable to perform ROS: Pt just says "Nothing is wrong with me. I am not in any pain."  Psychiatric/Behavioral:  Negative for depression, hallucinations, memory loss, substance abuse and suicidal ideas. The patient is nervous/anxious. The patient does not have insomnia.   Blood pressure (!) 130/92, pulse (!) 126, temperature 98.4 F (36.9 C), temperature source Oral, resp. rate 17, height 4\' 9"  (1.448 m), weight 66.2 kg, last menstrual period 06/02/2021, SpO2 99 %. Body mass index is 31.59 kg/m.   Treatment Plan Summary: Daily contact with patient to assess and evaluate symptoms and progress in treatment and Medication management  Update 06/21/21: Continue with 15 minute checks. Patient appears slightly less manic, still delusional. Continue medications as below, added Zofran for nausea.   Bipolar disorder with psychotic features -Continue Zyprexa (Zydis) 15 mg po daily at bedtime OR Zyprexa 15 mg IM -Continue Cogentin 0.5 mg po daily at bedtime   Tachycardia -Continue metoprolol tartrate 25 mg po twice daily   Hypokalemia -potassium chloride CR  tablet 40 mEQ ONCE (Completed 06/20/21)   PRN agitation -Continue Geodon 20 mg every 8 hr IM -Continue Zyprexa 10 mg every 6 hr po or IM  Nausea/vomiting -Start Zofran tablet 8 mg every 8 hours prn   PRN, Other  -Continue Tylenol 650 mg po every 6 hrs prn pain -Continue MAALOX/MYLANTA 30 mL po every 4 hrs prn indigestion -Continue Milk of Magnesia 30 mL po daily prn constipation       Vanetta MuldersLouise F Dylin Ihnen, NP 06/21/2021, 3:40 PM

## 2021-06-21 NOTE — Plan of Care (Signed)
Patient presents with paranoia and fixed smile   Problem: Education: Goal: Emotional status will improve Outcome: Not Progressing Goal: Mental status will improve Outcome: Not Progressing
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
## 2021-06-21 NOTE — Progress Notes (Signed)
Pt is still paranoid and irritable. She refused her bp pill "I don't want it from this hospital". She is restless, paces and has a irritated affect. She did attend group. Torrie Mayers RN

## 2021-06-21 NOTE — Progress Notes (Signed)
Patient has been irritable. On the phone mostly. Agreed to take medication orally but asked what it was for and was angry about having to take it or get an injection. Denies SI, HI and AVH

## 2021-06-22 ENCOUNTER — Encounter: Payer: Self-pay | Admitting: Psychiatry

## 2021-06-22 DIAGNOSIS — F312 Bipolar disorder, current episode manic severe with psychotic features: Secondary | ICD-10-CM | POA: Diagnosis not present

## 2021-06-22 MED ORDER — OLANZAPINE 10 MG PO TABS: 10.0000 mg | ORAL_TABLET | Freq: Four times a day (QID) | ORAL | Status: DC | PRN

## 2021-06-22 MED ORDER — HALOPERIDOL 5 MG PO TABS: 10.0000 mg | ORAL_TABLET | Freq: Every day | ORAL | Status: DC

## 2021-06-22 MED ORDER — HALOPERIDOL LACTATE 2 MG/ML PO CONC
10.0000 mg | Freq: Every day | ORAL | Status: DC
  Filled 2021-06-22: qty 5

## 2021-06-22 MED ORDER — HALOPERIDOL LACTATE 2 MG/ML PO CONC
5.0000 mg | Freq: Every day | ORAL | Status: DC
  Administered 2021-06-22: 5 mg via ORAL
  Filled 2021-06-22 (×2): qty 2.5

## 2021-06-22 MED ORDER — HALOPERIDOL LACTATE 5 MG/ML IJ SOLN: 5.0000 mg | Freq: Every day | INTRAMUSCULAR | Status: DC

## 2021-06-22 MED ORDER — HALOPERIDOL LACTATE 5 MG/ML IJ SOLN: 10.0000 mg | Freq: Every day | INTRAMUSCULAR | Status: DC

## 2021-06-22 NOTE — Progress Notes (Signed)
Patient presents with a fixed smile. Pt pleasant during assessment denies SI/HI/AVH. Patient observed interacting appropriately with staff and peers on the unit. Patient compliant with medication administration per MD orders. Patient given education, support and encouragement to be active in her treatment plan. Patient being monitored Q 15 minutes for safety per unit protocol. Pt remains safe on the unit 

## 2021-06-22 NOTE — Tx Team (Addendum)
Interdisciplinary Treatment and Diagnostic Plan Update  06/22/2021 Time of Session: 9:00AM Tricia Kerr MRN: [id number]  Principal Diagnosis: Bipolar affective disorder, current episode manic with psychotic symptoms (HCC)  Secondary Diagnoses: Principal Problem:   Bipolar affective disorder, current episode manic with psychotic symptoms (HCC)   Current Medications:  Current Facility-Administered Medications  Medication Dose Route Frequency Provider Last Rate Last Admin   acetaminophen (TYLENOL) tablet 650 mg  650 mg Oral Q6H PRN Clapacs, Jackquline Denmark, MD       alum & mag hydroxide-simeth (MAALOX/MYLANTA) 200-200-20 MG/5ML suspension 30 mL  30 mL Oral Q4H PRN Clapacs, John T, MD       benztropine (COGENTIN) tablet 0.5 mg  0.5 mg Oral QHS Clapacs, John T, MD   0.5 mg at 06/20/21 2054   magnesium hydroxide (MILK OF MAGNESIA) suspension 30 mL  30 mL Oral Daily PRN Clapacs, Jackquline Denmark, MD       metoprolol tartrate (LOPRESSOR) tablet 25 mg  25 mg Oral BID Clapacs, John T, MD   25 mg at 06/22/21 0741   OLANZapine zydis (ZYPREXA) disintegrating tablet 10 mg  10 mg Oral Q6H PRN Gabriel Cirri F, NP       Or   OLANZapine (ZYPREXA) injection 10 mg  10 mg Intramuscular Q6H PRN Gabriel Cirri F, NP       OLANZapine zydis (ZYPREXA) disintegrating tablet 15 mg  15 mg Oral QHS Jesse Sans, MD   15 mg at 06/20/21 2054   Or   OLANZapine (ZYPREXA) injection 15 mg  15 mg Intramuscular QHS Jesse Sans, MD       ondansetron [facility]) tablet 8 mg  8 mg Oral Q8H PRN Jesse Sans, MD   8 mg at 06/21/21 1218   ziprasidone (GEODON) injection 20 mg  20 mg Intramuscular Q8H PRN Vanetta Mulders, NP       PTA Medications: Medications Prior to Admission  Medication Sig Dispense Refill Last Dose   benztropine (COGENTIN) 0.5 MG tablet Take 1 tablet (0.5 mg total) by mouth at bedtime. 30 tablet 1    metoprolol tartrate (LOPRESSOR) 25 MG tablet Take 1 tablet (25 mg total) by mouth 2 (two) times daily. 60  tablet 1    OLANZapine (ZYPREXA) 15 MG tablet Take 1 tablet (15 mg total) by mouth at bedtime. 30 tablet 1     Patient Stressors: Medication change or noncompliance  Patient Strengths: Physical Health Supportive family/friends  Treatment Modalities: Medication Management, Group therapy, Case management,  1 to 1 session with clinician, Psychoeducation, Recreational therapy.   Physician Treatment Plan for Primary Diagnosis: Bipolar affective disorder, current episode manic with psychotic symptoms (HCC) Long Term Goal(s): Improvement in symptoms so as ready for discharge   Short Term Goals: Ability to identify changes in lifestyle to reduce recurrence of condition will improve Ability to verbalize feelings will improve Ability to demonstrate self-control will improve Ability to identify and develop effective coping behaviors will improve Ability to maintain clinical measurements within normal limits will improve Compliance with prescribed medications will improve Ability to identify triggers associated with substance abuse/mental health issues will improve  Medication Management: Evaluate patient's response, side effects, and tolerance of medication regimen.  Therapeutic Interventions: 1 to 1 sessions, Unit Group sessions and Medication administration.  Evaluation of Outcomes: Not Progressing  Physician Treatment Plan for Secondary Diagnosis: Principal Problem:   Bipolar affective disorder, current episode manic with psychotic symptoms (HCC)  Long Term Goal(s): Improvement in symptoms so as ready for  discharge   Short Term Goals: Ability to identify changes in lifestyle to reduce recurrence of condition will improve Ability to verbalize feelings will improve Ability to demonstrate self-control will improve Ability to identify and develop effective coping behaviors will improve Ability to maintain clinical measurements within normal limits will improve Compliance with prescribed  medications will improve Ability to identify triggers associated with substance abuse/mental health issues will improve     Medication Management: Evaluate patient's response, side effects, and tolerance of medication regimen.  Therapeutic Interventions: 1 to 1 sessions, Unit Group sessions and Medication administration.  Evaluation of Outcomes: Not Progressing   RN Treatment Plan for Primary Diagnosis: Bipolar affective disorder, current episode manic with psychotic symptoms (HCC) Long Term Goal(s): Knowledge of disease and therapeutic regimen to maintain health will improve  Short Term Goals: Ability to verbalize frustration and anger appropriately will improve, Ability to demonstrate self-control, Ability to participate in decision making will improve, Ability to identify and develop effective coping behaviors will improve, and Compliance with prescribed medications will improve  Medication Management: RN will administer medications as ordered by provider, will assess and evaluate patient's response and provide education to patient for prescribed medication. RN will report any adverse and/or side effects to prescribing provider.  Therapeutic Interventions: 1 on 1 counseling sessions, Psychoeducation, Medication administration, Evaluate responses to treatment, Monitor vital signs and CBGs as ordered, Perform/monitor CIWA, COWS, AIMS and Fall Risk screenings as ordered, Perform wound care treatments as ordered.  Evaluation of Outcomes: Not Progressing   LCSW Treatment Plan for Primary Diagnosis: Bipolar affective disorder, current episode manic with psychotic symptoms (HCC) Long Term Goal(s): Safe transition to appropriate next level of care at discharge, Engage patient in therapeutic group addressing interpersonal concerns.  Short Term Goals: Engage patient in aftercare planning with referrals and resources, Increase social support, Increase ability to appropriately verbalize feelings,  Increase emotional regulation, Facilitate acceptance of mental health diagnosis and concerns, Identify triggers associated with mental health/substance abuse issues, and Increase skills for wellness and recovery  Therapeutic Interventions: Assess for all discharge needs, 1 to 1 time with Social worker, Explore available resources and support systems, Assess for adequacy in community support network, Educate family and significant other(s) on suicide prevention, Complete Psychosocial Assessment, Interpersonal group therapy.  Evaluation of Outcomes: Not Progressing   Progress in Treatment: Attending groups: No. Participating in groups: No. Taking medication as prescribed: Yes. Toleration medication: Yes. Family/Significant other contact made: No, will contact:  when given permission. Patient understands diagnosis: No. Discussing patient identified problems/goals with staff: No. Medical problems stabilized or resolved: Yes. Denies suicidal/homicidal ideation: Yes. Issues/concerns per patient self-inventory: No. Other: none.  New problem(s) identified: No, Describe:  none.  New Short Term/Long Term Goal(s): elimination of symptoms of psychosis, medication management for mood stabilization; development of comprehensive mental wellness/sobriety plan.  Patient Goals: "To feel like I'm free to do whatever I want."    Discharge Plan or Barriers: CSW will assist pt with development of an appropriate aftercare/discharge plan.  Reason for Continuation of Hospitalization: Aggression Medication stabilization  Estimated Length of Stay: 1-7 days  Recreational Therapy: Patient Stressors: N/A Patient Goal: Patient will engage in groups without prompting or encouragement from LRT x3 group sessions within 5 recreation therapy group sessions.  Attendees: Patient: Tricia Kerr 06/22/2021 9:34 AM  Physician: Les Pou, MD 06/22/2021 9:34 AM  Nursing: Adela Glimpse, RN 06/22/2021 9:34 AM  RN  Care Manager: 06/22/2021 9:34 AM  Social Worker: Vilma Meckel. Algis Greenhouse, MSW, LCSW, LCAS  06/22/2021 9:34 AM  Recreational Therapist: Hilbert Bible, LRT  06/22/2021 9:34 AM  Other: Penni Homans, MSW, LCSW 06/22/2021 9:34 AM  Other: Kiva [place], MSW, LCSW-A 06/22/2021 9:34 AM  Other: Gabriel Cirri, NP 06/22/2021 9:34 AM    Scribe for Treatment Team: Glenis Smoker, LCSW 06/22/2021 9:34 AM

## 2021-06-22 NOTE — Progress Notes (Signed)
Recreation Therapy Notes  INPATIENT RECREATION TR PLAN  Patient Details Name: Tricia Kerr MRN: [id number] DOB: 10/31/1986 Today's Date: 06/22/2021  Rec Therapy Plan Is patient appropriate for Therapeutic Recreation?: Yes Treatment times per week: at least 3 Estimated Length of Stay: 5-7 days TR Treatment/Interventions: Group participation (Comment)  Discharge Criteria Pt will be discharged from therapy if:: Discharged Treatment plan/goals/alternatives discussed and agreed upon by:: Patient/family  Discharge Summary     Tricia Kerr 06/22/2021, 3:12 PM

## 2021-06-22 NOTE — BHH Group Notes (Signed)
LCSW Group Therapy Note  06/22/2021 12:34 PM  Type of Therapy/Topic:  Group Therapy:  Emotion Regulation  Participation Level:  None   Description of Group:   The purpose of this group is to assist patients in learning to regulate negative emotions and experience positive emotions. Patients will be guided to discuss ways in which they have been vulnerable to their negative emotions. These vulnerabilities will be juxtaposed with experiences of positive emotions or situations, and patients will be challenged to use positive emotions to combat negative ones. Special emphasis will be placed on coping with negative emotions in conflict situations, and patients will process healthy conflict resolution skills.  Therapeutic Goals: Patient will identify two positive emotions or experiences to reflect on in order to balance out negative emotions Patient will label two or more emotions that they find the most difficult to experience Patient will demonstrate positive conflict resolution skills through discussion and/or role plays  Summary of Patient Progress: Patient was present in group, however, did not engage in group discussion.   Therapeutic Modalities:   Cognitive Behavioral Therapy Feelings Identification Dialectical Behavioral Therapy  Penni Homans, MSW, LCSW 06/22/2021 12:34 PM

## 2021-06-22 NOTE — Progress Notes (Signed)
Recreation Therapy Notes   Date: 06/22/2021  Time: 10:15 am   Location: Craft Room      Behavioral response: Appropriate   Intervention Topic: Goals   Discussion/Intervention:  Group content on today was focused on goals. Patients described what goals are and how they define goals. Individuals expressed how they go about setting goals and reaching them. The group identified how important goals are and if they make short term goals to reach long term goals. Patients described how many goals they work on at a time and what affects them not reaching their goal. Individuals described how much time they put into planning and obtaining their goals. The group participated in the intervention "My Goal Board" and made personal goal boards to help them achieve their goal. Clinical Observations/Feedback: Patient came to group and expressed that her goal is to be free and feel like a regular American; to not have to explain anything. Individual was social with peers and staff while participating in the intervention.  Aayansh Codispoti LRT/CTRS         Christoph Copelan 06/22/2021 1:15 PM

## 2021-06-22 NOTE — Progress Notes (Signed)
Recreation Therapy Notes  INPATIENT RECREATION THERAPY ASSESSMENT  Patient Details Name: Tricia Kerr MRN: [id number] DOB: [birth year] Today's Date: 06/22/2021       Information Obtained From: Patient  Able to Participate in Assessment/Interview: Yes  Patient Presentation: Responsive  Reason for Admission (Per Patient): Active Symptoms  Patient Stressors: Relationship  Coping Skills:   Talk, Art, Prayer, Write  Leisure Interests (2+):  Music - Listen, Individual - TV, Individual - Writing  Frequency of Recreation/Participation: Monthly  Awareness of Community Resources:  Yes  Community Resources:  [employer]  Current Use: Yes  If no, Barriers?:    Expressed Interest in [employer] Information: Yes  County of Residence:  [place]  Patient Main Form of Transportation: Car  Patient Strengths:  Understanding  Patient Identified Areas of Improvement:  Being free  Patient Goal for Hospitalization:  To be free  Current SI (including self-harm):  No  Current HI:  No  Current AVH: No  Staff Intervention Plan: Group Attendance, Collaborate with Interdisciplinary Treatment Team  Consent to Intern Participation: N/A  Tricia Kerr 06/22/2021, 2:02 PM

## 2021-06-22 NOTE — Plan of Care (Signed)
Patient presents with fixed smile, and is labile   Problem: Education: Goal: Emotional status will improve Outcome: Not Progressing Goal: Mental status will improve Outcome: Not Progressing

## 2021-06-22 NOTE — Progress Notes (Addendum)
[facility] MD Progress Note  06/22/2021 4:53 PM Tricia Kerr  MRN:  [id number]  CC: " I am taking my blood pressure medicine but I refused my mental medicine last night because I do not need it."   Subjective:   Tricia Kerr is a 35 year old female with a history of bipolar disorder with psychotic features.  She was admitted involuntarily, presenting to the ED via police.  Patient's husband pettioned for involuntary commitment due to her threatening behaviors and delusions.  Patient was seen for treatment team this morning, where she stated that she did not want to explain every thought in her head and she wanted to be free.  This NP has observed patient throughout this stay mostly sitting quietly by herself.  She did attend a group but did not participate.  This NP approached patient as she was sitting in the day room late this afternoon among her peers.  Patient was reading the Bible and was agreeable to speak privately with me.  Patient denies any suicidal or homicidal ideations.  She continues to express paranoia that "this hospital is against her" and we are preventing her from exercising her freedoms.  She continues to say that she is here for no reason.  Patient states that "God is the only one that I trust."  Patient proceeded to read a passage from the Bible out loud.  Patient is subjectively very irritable and annoyed.  When asked if there is anything else I can do for her she states "just let me go home."  I encouraged patient to take all the medicine that is prescribed, as it is our desire for her to get better and be able to go home.  Patient denies any physical complaints at this time.   Principal Problem: Bipolar affective disorder, current episode manic with psychotic symptoms (HCC) Diagnosis: Principal Problem:   Bipolar affective disorder, current episode manic with psychotic symptoms (HCC)  Total Time spent with patient: 15 minutes  Past Psychiatric History:  Past history of prior  episodes of psychosis.  She has been admitted numerous times since childhood.  During hospital 2016 she was discharged with Risperdal.  She was followed up with [facility] but then discontinued treatment. She was last admitted at this hospital June 2022, discharged June 23. She was stabilized on Zyprexa, 15 mg nightly.  She has not been compliant with her medications.  Past Medical History:  Past Medical History:  Diagnosis Date   Anxiety    Bipolar disorder [facility])     Past Surgical History:  Procedure Laterality Date   APPENDECTOMY     TUBAL LIGATION     tubial     Family History: History reviewed. No pertinent family history. Family Psychiatric  History: Unknown/Denies Social History:  Social History   Substance and Sexual Activity  Alcohol Use Yes     Social History   Substance and Sexual Activity  Drug Use Yes   Types: Marijuana   Comment: months    Social History   Socioeconomic History   Marital status: Divorced    Spouse name: Not on file   Number of children: Not on file   Years of education: Not on file   Highest education level: Not on file  Occupational History   Not on file  Tobacco Use   Smoking status: Every Day    Packs/day: 1.00    Pack years: 0.00    Types: Cigarettes   Smokeless tobacco: Never  Substance and Sexual Activity  Alcohol use: Yes   Drug use: Yes    Types: Marijuana    Comment: months   Sexual activity: Not Currently  Other Topics Concern   Not on file  Social History Narrative   Not on file   Social Determinants of Health   Financial Resource Strain: Not on file  Food Insecurity: Not on file  Transportation Needs: Not on file  Physical Activity: Not on file  Stress: Not on file  Social Connections: Not on file   Additional Social History:      Sleep: Fair  Appetite:  Good  Current Medications: Current Facility-Administered Medications  Medication Dose Route Frequency Provider Last Rate Last Admin   acetaminophen  (TYLENOL) tablet 650 mg  650 mg Oral Q6H PRN Clapacs, Jackquline DenmarkJohn T, MD       alum & mag hydroxide-simeth (MAALOX/MYLANTA) 200-200-20 MG/5ML suspension 30 mL  30 mL Oral Q4H PRN Clapacs, Jackquline DenmarkJohn T, MD       benztropine (COGENTIN) tablet 0.5 mg  0.5 mg Oral QHS Clapacs, John T, MD   0.5 mg at 06/20/21 2054   haloperidol (HALDOL) 2 MG/ML solution 5 mg  5 mg Oral QHS Barthold, Louise F, NP       Or   haloperidol lactate (HALDOL) injection 5 mg  5 mg Intramuscular QHS Barthold, Louise F, NP       magnesium hydroxide (MILK OF MAGNESIA) suspension 30 mL  30 mL Oral Daily PRN Clapacs, John T, MD       metoprolol tartrate (LOPRESSOR) tablet 25 mg  25 mg Oral BID Clapacs, John T, MD   25 mg at 06/22/21 0741   OLANZapine (ZYPREXA) tablet 10 mg  10 mg Oral Q6H PRN Gabriel CirriBarthold, Louise F, NP       ondansetron (ZOFRAN) tablet 8 mg  8 mg Oral Q8H PRN Jesse SansFreeman, Lasha Echeverria M, MD   8 mg at 06/21/21 1218   ziprasidone (GEODON) injection 20 mg  20 mg Intramuscular Q8H PRN Gabriel CirriBarthold, Louise F, NP        Lab Results:  No results found for this or any previous visit (from the past 48 hour(s)).   Blood Alcohol level:  Lab Results  Component Value Date   ETH <10 06/19/2021   ETH <10 05/31/2021    Metabolic Disorder Labs: Lab Results  Component Value Date   HGBA1C 5.5 06/04/2021   MPG 111.15 06/04/2021   No results found for: PROLACTIN Lab Results  Component Value Date   CHOL 170 06/04/2021   TRIG 68 06/04/2021   HDL 41 06/04/2021   CHOLHDL 4.1 06/04/2021   VLDL 14 06/04/2021   LDLCALC 115 (H) 06/04/2021    Physical Findings: AIMS:  , ,  ,  ,    CIWA:    COWS:     Musculoskeletal: Strength & Muscle Tone: within normal limits Gait & Station: normal Patient leans: N/A  Psychiatric Specialty Exam:  Presentation  General Appearance: Appropriate for Environment  Eye Contact:Good  Speech:Clear and Coherent; Normal Rate  Speech Volume:Normal  Handedness:Right   Mood and Affect  Mood:Angry;  Irritable  Affect:Blunt   Thought Process  Thought Processes:Disorganized  Descriptions of Associations:Loose  Orientation:Full (Time, Place and Person)  Thought Content:Perseveration; Paranoid Ideation; Illogical  History of Schizophrenia/Schizoaffective disorder:Yes  Duration of Psychotic Symptoms:Greater than six months  Hallucinations:Hallucinations: None (Denies)  Ideas of Reference:Delusions; Paranoia  Suicidal Thoughts:Suicidal Thoughts: No (Denies)  Homicidal Thoughts:Homicidal Thoughts: No (Denies)   Sensorium  Memory:Recent Fair; Remote Fair; Immediate Fair  Judgment:Impaired  Insight:Lacking   Executive Functions  Concentration:Poor  Attention Span:Fair  Recall:Poor  Fund of Knowledge:Good  Language:Good   Psychomotor Activity  Psychomotor Activity:Psychomotor Activity: Normal   Assets  Assets:Resilience; Physical Health; Social Support; Health and safety inspector; Housing   Sleep  Sleep:Sleep: Fair Number of Hours of Sleep: 7.5    Physical Exam: Physical Exam HENT:     Head: Normocephalic.     Nose: No congestion or rhinorrhea.  Eyes:     General:        Right eye: No discharge.        Left eye: No discharge.  Pulmonary:     Effort: Pulmonary effort is normal.  Musculoskeletal:        General: Normal range of motion.     Cervical back: Normal range of motion.  Neurological:     Mental Status: She is alert and oriented to person, place, and time.  Psychiatric:        Attention and Perception: She is inattentive.        Mood and Affect: Affect is inappropriate.        Speech: Speech is rapid and pressured and tangential.        Behavior: Behavior is hyperactive.        Thought Content: Thought content is paranoid and delusional.        Judgment: Judgment is impulsive and inappropriate.     Comments: Psychotic, manic   Review of Systems  Reason unable to perform ROS: Response to question of any pain or other problems:  "No"  Psychiatric/Behavioral:  Negative for depression, hallucinations, memory loss, substance abuse and suicidal ideas. The patient is nervous/anxious. The patient does not have insomnia.   All other systems reviewed and are negative. Blood pressure 129/89, pulse (!) 131, temperature 99.1 F (37.3 C), temperature source Oral, resp. rate 18, height 4\' 9"  (1.448 m), weight 66.2 kg, last menstrual period 06/02/2021, SpO2 99 %. Body mass index is 31.59 kg/m.   Treatment Plan Summary: Daily contact with patient to assess and evaluate symptoms and progress in treatment and Medication management  Update 06/22/21: Patient continues to express delusional thoughts.  She has less mania and pressured speech.  Patient has not received any as needed medications for agitation.  Patient refused Zyprexa last night and nonemergent forced IM medication was not given.  Reinforced to nursing that IM Zyprexa must be given if patient refuses oral.  Bipolar disorder with psychotic features -Zyprexa discontinued due to lack of efficacy - Start Haldol 5mg  liquid PO or IM. Titrate to effect with plan to transition to long acting injectable  -Continue Cogentin 0.5 mg po daily at bedtime for EPS prophylaxis   Tachycardia -Continue metoprolol tartrate 25 mg po twice daily   Hypokalemia -potassium chloride CR tablet 40 mEQ ONCE (Completed 06/20/21)   PRN agitation -Continue Geodon 20 mg every 8 hr IM -Continue Zyprexa 10 mg every 6 hr po or IM  Nausea/vomiting -Start Zofran tablet 8 mg every 8 hours prn   PRN, Other  -Continue Tylenol 650 mg po every 6 hrs prn pain -Continue MAALOX/MYLANTA 30 mL po every 4 hrs prn indigestion -Continue Milk of Magnesia 30 mL po daily prn constipation       , NP 06/22/2021, 4:53 PM  Above note written by Vanetta Mulders, NP. Agree with assessment and plan. Patient continues to have zero insight into her mental illness. She continues to display clear signs and  symptoms of psychosis. Will  continue non-emergent forced medications as above with plan to transition to long-acting injectable.

## 2021-06-22 NOTE — Plan of Care (Signed)
Patient stated that she could not sleep last night. Patients affect is labile irritable and smiles at times. Smiling and talking to herself in her room. Patient came to staff and asked " if I call my ride I can go home right." Patient is receptive to know that she is not going home today.Patient came to med room and took her Metoprolol this morning. Denies SI,HI and AVH. Appetite and energy level good. Support and encouragement given.

## 2021-06-23 DIAGNOSIS — F312 Bipolar disorder, current episode manic severe with psychotic features: Secondary | ICD-10-CM | POA: Diagnosis not present

## 2021-06-23 MED ORDER — HALOPERIDOL LACTATE 5 MG/ML IJ SOLN: 10.0000 mg | Freq: Every day | INTRAMUSCULAR | Status: DC

## 2021-06-23 MED ORDER — HALOPERIDOL LACTATE 2 MG/ML PO CONC
10.0000 mg | Freq: Every day | ORAL | Status: DC
  Administered 2021-06-23: 10 mg via ORAL
  Filled 2021-06-23: qty 5

## 2021-06-23 NOTE — Progress Notes (Signed)
Pt has went to groups today. She has also been social however very irritable and uncooperative to me. She was not med compliant this afternoon even after being educated on BP medicine. She has a fixed smile on her face continually   Torrie Mayers RN

## 2021-06-23 NOTE — Progress Notes (Signed)
[facility] MD Progress Note  06/23/2021 1:50 PM Tricia Kerr  MRN:  [id number]  CC: " Unless you want to talk about me going home I have nothing else to say."   Subjective:   Tricia Kerr is a 35 year old female with a history of bipolar disorder with psychotic features.  She was admitted involuntarily, presenting to the ED via police.  Patient's husband pettioned for involuntary commitment due to her threatening behaviors and delusions.  Patient was interviewed by this NP today.  She is continues to voice her irritability at being hospitalized, stating that there is no reason for her to be here.  She insists that we are keeping her against her will for some kind of monetary gain and that her in-laws own this hospital.  She perseverates on discharge and her delusional thinking.  She is calmer in general, often reading in the day room.  Patient denies homicidal or suicidal ideations.  She denies auditory or visual hallucinations. She has been observed walking the halls smiling and appears to be talking to herself in a low voice.  No unsafe behavior has been noted.  She did take her Haldol and Cogentin by mouth last night.  She took her Lopressor this morning after some encouragement from nursing.  Plan to increase Haldol to 10 mg at bedtime.   Principal Problem: Bipolar affective disorder, current episode manic with psychotic symptoms (HCC) Diagnosis: Principal Problem:   Bipolar affective disorder, current episode manic with psychotic symptoms (HCC)  Total Time spent with patient: 15 minutes  Past Psychiatric History:  Past history of prior episodes of psychosis.  She has been admitted numerous times since childhood.  During hospital 2016 she was discharged with Risperdal.  She was followed up with [facility] but then discontinued treatment. She was last admitted at this hospital June 2022, discharged June 23. She was stabilized on Zyprexa, 15 mg nightly.  She has not been compliant with her  medications.  Past Medical History:  Past Medical History:  Diagnosis Date   Anxiety    Bipolar disorder [facility])     Past Surgical History:  Procedure Laterality Date   APPENDECTOMY     TUBAL LIGATION     tubial     Family History: History reviewed. No pertinent family history. Family Psychiatric  History: Unknown/Denies Social History:  Social History   Substance and Sexual Activity  Alcohol Use Yes     Social History   Substance and Sexual Activity  Drug Use Yes   Types: Marijuana   Comment: months    Social History   Socioeconomic History   Marital status: Divorced    Spouse name: Not on file   Number of children: Not on file   Years of education: Not on file   Highest education level: Not on file  Occupational History   Not on file  Tobacco Use   Smoking status: Every Day    Packs/day: 1.00    Types: Cigarettes   Smokeless tobacco: Never  Substance and Sexual Activity   Alcohol use: Yes   Drug use: Yes    Types: Marijuana    Comment: months   Sexual activity: Not Currently  Other Topics Concern   Not on file  Social History Narrative   Not on file   Social Determinants of Health   Financial Resource Strain: Not on file  Food Insecurity: Not on file  Transportation Needs: Not on file  Physical Activity: Not on file  Stress: Not on file  Social Connections: Not on file   Additional Social History:      Sleep: Fair  Appetite:  Good  Current Medications: Current Facility-Administered Medications  Medication Dose Route Frequency Provider Last Rate Last Admin   acetaminophen (TYLENOL) tablet 650 mg  650 mg Oral Q6H PRN Clapacs, Jackquline Denmark, MD       alum & mag hydroxide-simeth (MAALOX/MYLANTA) 200-200-20 MG/5ML suspension 30 mL  30 mL Oral Q4H PRN Clapacs, Jackquline Denmark, MD       benztropine (COGENTIN) tablet 0.5 mg  0.5 mg Oral QHS Clapacs, John T, MD   0.5 mg at 06/22/21 2117   haloperidol (HALDOL) 2 MG/ML solution 10 mg  10 mg Oral QHS Gabriel Cirri  F, NP       Or   haloperidol lactate (HALDOL) injection 10 mg  10 mg Intramuscular QHS Khamani Daniely, Sallye Ober F, NP       magnesium hydroxide (MILK OF MAGNESIA) suspension 30 mL  30 mL Oral Daily PRN Clapacs, John T, MD       metoprolol tartrate (LOPRESSOR) tablet 25 mg  25 mg Oral BID Clapacs, John T, MD   25 mg at 06/23/21 0722   OLANZapine (ZYPREXA) tablet 10 mg  10 mg Oral Q6H PRN Gabriel Cirri F, NP       ondansetron (ZOFRAN) tablet 8 mg  8 mg Oral Q8H PRN Jesse Sans, MD   8 mg at 06/21/21 1218   ziprasidone (GEODON) injection 20 mg  20 mg Intramuscular Q8H PRN Gabriel Cirri F, NP        Lab Results:  No results found for this or any previous visit (from the past 48 hour(s)).   Blood Alcohol level:  Lab Results  Component Value Date   ETH <10 06/19/2021   ETH <10 05/31/2021    Metabolic Disorder Labs: Lab Results  Component Value Date   HGBA1C 5.5 06/04/2021   MPG 111.15 06/04/2021   No results found for: PROLACTIN Lab Results  Component Value Date   CHOL 170 06/04/2021   TRIG 68 06/04/2021   HDL 41 06/04/2021   CHOLHDL 4.1 06/04/2021   VLDL 14 06/04/2021   LDLCALC 115 (H) 06/04/2021    Physical Findings: AIMS:  , ,  ,  ,    CIWA:    COWS:     Musculoskeletal: Strength & Muscle Tone: within normal limits Gait & Station: normal Patient leans: N/A  Psychiatric Specialty Exam:  Presentation  General Appearance: Casual  Eye Contact:Good  Speech:Clear and Coherent  Speech Volume:Normal  Handedness:Right   Mood and Affect  Mood:Irritable  Affect:Blunt   Thought Process  Thought Processes:Disorganized  Descriptions of Associations:Loose  Orientation:Full (Time, Place and Person)  Thought Content:Illogical; Paranoid Ideation; Delusions; Scattered; Perseveration  History of Schizophrenia/Schizoaffective disorder:Yes  Duration of Psychotic Symptoms:Greater than six months  Hallucinations:Hallucinations: None (Denies)  Ideas of  Reference:Delusions; Paranoia  Suicidal Thoughts:Suicidal Thoughts: No  Homicidal Thoughts:Homicidal Thoughts: No   Sensorium  Memory:Immediate Good; Recent Poor; Remote Poor (Delusional)  Judgment:Impaired  Insight:Lacking   Executive Functions  Concentration:Poor  Attention Span:Poor  Recall:Poor  Fund of Knowledge:Good  Language:Good   Psychomotor Activity  Psychomotor Activity:Psychomotor Activity: Normal   Assets  Assets:Resilience; Physical Health; Social Support; Health and safety inspector; Housing   Sleep  Sleep:Sleep: Fair Number of Hours of Sleep: 7    Physical Exam: Physical Exam HENT:     Head: Normocephalic.     Nose: No congestion or rhinorrhea.  Eyes:     General:  Right eye: No discharge.        Left eye: No discharge.  Pulmonary:     Effort: Pulmonary effort is normal.  Musculoskeletal:        General: Normal range of motion.     Cervical back: Normal range of motion.  Neurological:     Mental Status: She is alert and oriented to person, place, and time.  Psychiatric:        Attention and Perception: She is inattentive.        Mood and Affect: Affect is inappropriate.        Speech: Speech is rapid and pressured and tangential.        Behavior: Behavior is hyperactive.        Thought Content: Thought content is paranoid and delusional.        Cognition and Memory: Memory is impaired.        Judgment: Judgment is impulsive and inappropriate.     Comments: No insight into her mental illness   Review of Systems  Reason unable to perform ROS: Patient denies any pain, problems or need to be hospitalized.  Psychiatric/Behavioral:  Negative for depression, hallucinations, memory loss, substance abuse and suicidal ideas. The patient is not nervous/anxious and does not have insomnia.   All other systems reviewed and are negative. Blood pressure 133/87, pulse (!) 128, temperature 98.8 F (37.1 C), temperature source Oral, resp.  rate 18, height 4\' 9"  (1.448 m), weight 66.2 kg, last menstrual period 06/02/2021, SpO2 99 %. Body mass index is 31.59 kg/m.   Treatment Plan Summary: Daily contact with patient to assess and evaluate symptoms and progress in treatment and Medication management  Update 06/23/21: Patient continues to express delusional thoughts and has no insight into her delusional thinking.   Bipolar disorder with psychotic features - Continue Haldol: Increase from 5mg  liquid PO or IM to 10 mg liquid PO or IM at bedtime (NEFM) if refuses PO). Will titrate to effect with plan to transition to long acting injectable  -Continue Cogentin 0.5 mg po daily at bedtime for EPS prophylaxis   Tachycardia -Continue metoprolol tartrate 25 mg po twice daily   Hypokalemia -potassium chloride CR tablet 40 mEQ ONCE (Completed 06/20/21)   PRN agitation -Continue Geodon 20 mg every 8 hr IM -Continue Zyprexa 10 mg every 6 hr po or IM  Nausea/vomiting -Start Zofran tablet 8 mg every 8 hours prn   PRN, Other  -Continue Tylenol 650 mg po every 6 hrs prn pain -Continue MAALOX/MYLANTA 30 mL po every 4 hrs prn indigestion -Continue Milk of Magnesia 30 mL po daily prn constipation       , NP 06/23/2021, 1:50 PM

## 2021-06-23 NOTE — Progress Notes (Signed)
Recreation Therapy Notes    Date: 06/23/2021  Time: 10:00 am   Location: Court yard    Behavioral response: Appropriate   Intervention Topic: Social skills    Discussion/Intervention:  Group content on today was focused on social skills. The group defined social skills and identified ways they use social skills. Patients expressed what obstacles they face when trying to be social. Participants described the importance of social skills. The group listed ways to improve social skills and reasons to improve social skills. Individuals had an opportunity to learn new and improve social skills as well as identify their weaknesses. Clinical Observations/Feedback: Patient came to group and was focused on the topic at hand. Individual was social with peers and staff while participating in the intervention.  Titilayo Hagans LRT/CTRS         Evalisse Prajapati 06/23/2021 12:04 PM 

## 2021-06-23 NOTE — [facility] Group Notes (Signed)
LCSW Group Therapy Note  06/23/2021 2:05 PM  Type of Therapy/Topic:  Group Therapy:  Balance in Life  Participation Level:  Minimal  Description of Group:    This group will address the concept of balance and how it feels and looks when one is unbalanced. Patients will be encouraged to process areas in their lives that are out of balance and identify reasons for remaining unbalanced. Facilitators will guide patients in utilizing problem-solving interventions to address and correct the stressor making their life unbalanced. Understanding and applying boundaries will be explored and addressed for obtaining and maintaining a balanced life. Patients will be encouraged to explore ways to assertively make their unbalanced needs known to significant others in their lives, using other group members and facilitator for support and feedback.  Therapeutic Goals: Patient will identify two or more emotions or situations they have that consume much of in their lives. Patient will identify signs/triggers that life has become out of balance:  Patient will identify two ways to set boundaries in order to achieve balance in their lives:  Patient will demonstrate ability to communicate their needs through discussion and/or role plays  Summary of Patient Progress: Patient was present for the entirety of group. She stated that a certain family causes her to become off balance. She shared that she feels that they are against her because she is a spiritual person and they are a different faith. Pt lacked insight around her mental health and expressed that her issue is specifically with other people. Pt did not examine her behavior or actions other than to say she wants to be able to do what she wants to do.   Therapeutic Modalities:   Cognitive Behavioral Therapy Solution-Focused Therapy Assertiveness Training  [employer]. Algis Greenhouse, MSW, LCSW, LCAS 06/23/2021 2:05 PM

## 2021-06-23 NOTE — Progress Notes (Signed)
Patient presents with a fixed smile. Pt pleasant during assessment denies SI/HI/AVH. Patient observed interacting appropriately with staff and peers on the unit. Patient compliant with medication administration per MD orders. Patient given education, support and encouragement to be active in her treatment plan. Patient being monitored Q 15 minutes for safety per unit protocol. Pt remains safe on the unit

## 2021-06-23 NOTE — Plan of Care (Signed)
Patient presents with fixed smile  Problem: Education: Goal: Emotional status will improve Outcome: Not Progressing Goal: Mental status will improve Outcome: Not Progressing

## 2021-06-23 NOTE — Plan of Care (Signed)
Pt denies depression, anxiety, SI, HI and AVH. Pt was educated on care plan and verbalizes understanding. Torrie Mayers RN Problem: Education: Goal: Knowledge of [facility] Education information/materials will improve Outcome: Progressing Goal: Emotional status will improve Outcome: Not Progressing Goal: Mental status will improve Outcome: Not Progressing Goal: Verbalization of understanding the information provided will improve Outcome: Progressing   Problem: Activity: Goal: Interest or engagement in activities will improve Outcome: Progressing Goal: Sleeping patterns will improve Outcome: Progressing   Problem: Coping: Goal: Ability to verbalize frustrations and anger appropriately will improve Outcome: Progressing Goal: Ability to demonstrate self-control will improve Outcome: Progressing   Problem: Health Behavior/Discharge Planning: Goal: Identification of resources available to assist in meeting health care needs will improve Outcome: Progressing Goal: Compliance with treatment plan for underlying cause of condition will improve Outcome: Progressing   Problem: Physical Regulation: Goal: Ability to maintain clinical measurements within normal limits will improve Outcome: Progressing   Problem: Safety: Goal: Periods of time without injury will increase Outcome: Progressing   Problem: Activity: Goal: Will verbalize the importance of balancing activity with adequate rest periods Outcome: Progressing   Problem: Education: Goal: Will be free of psychotic symptoms Outcome: Not Progressing Goal: Knowledge of the prescribed therapeutic regimen will improve Outcome: Progressing   Problem: Coping: Goal: Coping ability will improve Outcome: Progressing Goal: Will verbalize feelings Outcome: Progressing   Problem: Health Behavior/Discharge Planning: Goal: Compliance with prescribed medication regimen will improve Outcome: Progressing   Problem:  Nutritional: Goal: Ability to achieve adequate nutritional intake will improve Outcome: Progressing   Problem: Role Relationship: Goal: Ability to communicate needs accurately will improve Outcome: Progressing Goal: Ability to interact with others will improve Outcome: Progressing   Problem: Safety: Goal: Ability to redirect hostility and anger into socially appropriate behaviors will improve Outcome: Progressing Goal: Ability to remain free from injury will improve Outcome: Progressing   Problem: Self-Care: Goal: Ability to participate in self-care as condition permits will improve Outcome: Progressing   Problem: Self-Concept: Goal: Will verbalize positive feelings about self Outcome: Progressing

## 2021-06-24 DIAGNOSIS — F312 Bipolar disorder, current episode manic severe with psychotic features: Secondary | ICD-10-CM | POA: Diagnosis not present

## 2021-06-24 MED ORDER — HALOPERIDOL LACTATE 2 MG/ML PO CONC
15.0000 mg | Freq: Every day | ORAL | Status: DC
  Administered 2021-06-24 – 2021-06-26 (×3): 15 mg via ORAL
  Filled 2021-06-24 (×4): qty 7.5

## 2021-06-24 MED ORDER — HALOPERIDOL LACTATE 5 MG/ML IJ SOLN: 10.0000 mg | Freq: Every day | INTRAMUSCULAR | Status: DC

## 2021-06-24 NOTE — Plan of Care (Signed)
Pt denies depression, anxiety, SI, HI and AVH. Pt was educated on care plan and verbalizes understanding. Torrie Mayers RN Problem: Education: Goal: Knowledge of [facility] Education information/materials will improve Outcome: Not Progressing Goal: Emotional status will improve Outcome: Not Progressing Goal: Mental status will improve Outcome: Not Progressing Goal: Verbalization of understanding the information provided will improve Outcome: Not Progressing   Problem: Activity: Goal: Interest or engagement in activities will improve Outcome: Not Progressing Goal: Sleeping patterns will improve Outcome: Not Progressing   Problem: Coping: Goal: Ability to verbalize frustrations and anger appropriately will improve Outcome: Not Progressing Goal: Ability to demonstrate self-control will improve Outcome: Not Progressing   Problem: Health Behavior/Discharge Planning: Goal: Identification of resources available to assist in meeting health care needs will improve Outcome: Not Progressing Goal: Compliance with treatment plan for underlying cause of condition will improve Outcome: Not Progressing   Problem: Physical Regulation: Goal: Ability to maintain clinical measurements within normal limits will improve Outcome: Not Progressing   Problem: Safety: Goal: Periods of time without injury will increase Outcome: Progressing   Problem: Activity: Goal: Will verbalize the importance of balancing activity with adequate rest periods Outcome: Not Progressing   Problem: Education: Goal: Will be free of psychotic symptoms Outcome: Not Progressing Goal: Knowledge of the prescribed therapeutic regimen will improve Outcome: Not Progressing   Problem: Coping: Goal: Coping ability will improve Outcome: Not Progressing Goal: Will verbalize feelings Outcome: Not Progressing   Problem: Health Behavior/Discharge Planning: Goal: Compliance with prescribed medication regimen will  improve Outcome: Not Progressing   Problem: Nutritional: Goal: Ability to achieve adequate nutritional intake will improve Outcome: Not Progressing   Problem: Role Relationship: Goal: Ability to communicate needs accurately will improve Outcome: Not Progressing Goal: Ability to interact with others will improve Outcome: Not Progressing   Problem: Safety: Goal: Ability to redirect hostility and anger into socially appropriate behaviors will improve Outcome: Not Progressing Goal: Ability to remain free from injury will improve Outcome: Not Progressing   Problem: Self-Care: Goal: Ability to participate in self-care as condition permits will improve Outcome: Not Progressing   Problem: Self-Concept: Goal: Will verbalize positive feelings about self Outcome: Not Progressing

## 2021-06-24 NOTE — Progress Notes (Signed)
Recreation Therapy Notes  Date: 06/24/2021  Time: 10:15 am   Location: Courtyard    Behavioral response: Appropriate   Intervention Topic: Wellness   Discussion/Intervention:  Group content today was focused on Wellness. The group defined wellness and some positive ways they make decisions for themselves. Individuals expressed reasons why they neglected any wellness in the past. Patients described ways to improve wellness skills in the future. The group explained what could happen if they did not do any wellness at all. Participants express how bad choices has affected them and others around them. Individual explained the importance of wellness. The group participated in the intervention "Testing my Wellness" where they had a chance to identify some of their weaknesses and strengths in wellness.  Clinical Observations/Feedback: Patient came to group and defined wellness as being active. Individual was social with peers and staff while participating in the intervention.  Rylen Hou LRT/CTRS         Lois Ostrom 06/24/2021 11:34 AM
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
## 2021-06-24 NOTE — [facility] Group Notes (Signed)
LCSW Group Therapy Note     06/24/2021 2:13 PM     Type of Therapy and Topic:  Group Therapy:  Feelings around Relapse and Recovery     Participation Level:  Minimal     Description of Group:    Patients in this group will discuss emotions they experience before and after a relapse. They will process how experiencing these feelings, or avoidance of experiencing them, relates to having a relapse. Facilitator will guide patients to explore emotions they have related to recovery. Patients will be encouraged to process which emotions are more powerful. They will be guided to discuss the emotional reaction significant others in their lives may have to their relapse or recovery. Patients will be assisted in exploring ways to respond to the emotions of others without this contributing to a relapse.     Therapeutic Goals:  1.    Patient will identify two or more emotions that lead to a relapse for them  2.    Patient will identify two emotions that result when they relapse  3.    Patient will identify two emotions related to recovery  4.    Patient will demonstrate ability to communicate their needs through discussion and/or role plays        Summary of Patient Progress: Patient was present for the entirety of the group session. Patient  participated in the topic of discussion. She stated that she is ready to leave and feels that would contribute to her recovery more than anything else. When asked about her behaviors potentially contributing to her hospital admittance, she denied any bizarre actions.       Therapeutic Modalities:   Cognitive Behavioral Therapy  Solution-Focused Therapy  Assertiveness Training  Relapse Prevention Therapy        Chanta Bauers [place], MSW, LCSW-A  06/24/2021 2:13 PM

## 2021-06-24 NOTE — Progress Notes (Signed)
Pt attended group and also been somewhat social. She has a fixed grin continuously and seems restless and she paces. She allowed me to take her vitals but refused her med. She seems irritable and denies everything. She does remain calm. Torrie Mayers RN

## 2021-06-25 NOTE — Plan of Care (Signed)
D: Patient denies SI / HI/ AVH. Patient is in dayroom then goes to bed. No signs of distress noted.   A: Patient was assessed by this nurse. Patient received scheduled medications. Q x 15 minute observation checks were completed for safety. Patient was provided with verbal education on provided medications. Patient care plan was reviewed. Patient was offered support and encouragement. Patient was encourage to attend groups, participate in unit activities and continue with plan of care.    R: Patient adheres with scheduled medication. Patient has no complaints of pain at this time. Patient is receptive to treatment and safety maintained on unit.     Problem: Education: Goal: Knowledge of [facility] Education information/materials will improve Outcome: Not Progressing Goal: Emotional status will improve Outcome: Not Progressing Goal: Mental status will improve Outcome: Not Progressing Goal: Verbalization of understanding the information provided will improve Outcome: Not Progressing

## 2021-06-25 NOTE — Progress Notes (Signed)
[facility] MD Progress Note  06/25/2021 1:55 PM Tricia Kerr  MRN:  [id number]  CC: " why am I here? "   Subjective:   Tricia Kerr is a 35 year old female with a history of bipolar disorder with psychotic features.  She was admitted involuntarily, presenting to the ED via police.    Staff reported No acute events overnight. She has been medication compliant except the 2nd dose of metoprolol,  and attending to ADLs.    Today, she continues to have no insight of her mental illness.  She attributes her repeated hospitalizations and IVC to "Tricia Kerr" (her husband), but pt refused to admit that she is married.  She said that she is upset because Tricia told her that Tricia Kerr is taking their 13yo daughter to play softball in [facility] with 18U team (34yo and under team), and stated that Tricia hopes that their daughter will be scouted by a Public relations account executive.  She implies that because their daughter is so young for college, that her husband must be lying to her.   Then she went on to say that she is not sure their daughter is hers, even though she admitted that she carried the pregnancy and give birth to the child, which is her baseline delusion, per records.   She continues to ask about the reason for her hospitalization, believes that she has done nothing out of ordinary and would not do anything differently despite the repeated hospitalizations.    No SI or HI.   Principal Problem: Bipolar affective disorder, current episode manic with psychotic symptoms (HCC) Diagnosis: Principal Problem:   Bipolar affective disorder, current episode manic with psychotic symptoms (HCC)  Total Time spent with patient: 20 minutes  Past Psychiatric History:  See H&P  Past Medical History:  Past Medical History:  Diagnosis Date   Anxiety    Bipolar disorder (HCC)     Past Surgical History:  Procedure Laterality Date   APPENDECTOMY     TUBAL LIGATION     tubial     Family History: History reviewed. No pertinent family  history. Family Psychiatric  History: Unknown/Denies Social History:  Social History   Substance and Sexual Activity  Alcohol Use Yes     Social History   Substance and Sexual Activity  Drug Use Yes   Types: Marijuana   Comment: months    Social History   Socioeconomic History   Marital status: Divorced    Spouse name: Not on file   Number of children: Not on file   Years of education: Not on file   Highest education level: Not on file  Occupational History   Not on file  Tobacco Use   Smoking status: Every Day    Packs/day: 1.00    Types: Cigarettes   Smokeless tobacco: Never  Substance and Sexual Activity   Alcohol use: Yes   Drug use: Yes    Types: Marijuana    Comment: months   Sexual activity: Not Currently  Other Topics Concern   Not on file  Social History Narrative   Not on file   Social Determinants of Health   Financial Resource Strain: Not on file  Food Insecurity: Not on file  Transportation Needs: Not on file  Physical Activity: Not on file  Stress: Not on file  Social Connections: Not on file   Additional Social History:      Sleep: Fair  Appetite:  Good  Current Medications: Current Facility-Administered Medications  Medication Dose Route Frequency Provider  Last Rate Last Admin   acetaminophen (TYLENOL) tablet 650 mg  650 mg Oral Q6H PRN Clapacs, John T, MD       alum & mag hydroxide-simeth (MAALOX/MYLANTA) 200-200-20 MG/5ML suspension 30 mL  30 mL Oral Q4H PRN Clapacs, John T, MD       benztropine (COGENTIN) tablet 0.5 mg  0.5 mg Oral QHS Clapacs, John T, MD   0.5 mg at 06/24/21 2102   haloperidol (HALDOL) 2 MG/ML solution 15 mg  15 mg Oral QHS Jesse Sans, MD   15 mg at 06/24/21 2102   Or   haloperidol lactate (HALDOL) injection 10 mg  10 mg Intramuscular QHS Jesse Sans, MD       magnesium hydroxide (MILK OF MAGNESIA) suspension 30 mL  30 mL Oral Daily PRN Clapacs, Jackquline Denmark, MD       metoprolol tartrate (LOPRESSOR) tablet 25  mg  25 mg Oral BID Clapacs, John T, MD   25 mg at 06/24/21 0727   OLANZapine (ZYPREXA) tablet 10 mg  10 mg Oral Q6H PRN Vanetta Mulders, NP       ondansetron (ZOFRAN) tablet 8 mg  8 mg Oral Q8H PRN Jesse Sans, MD   8 mg at 06/21/21 1218   ziprasidone (GEODON) injection 20 mg  20 mg Intramuscular Q8H PRN Gabriel Cirri F, NP        Lab Results:  No results found for this or any previous visit (from the past 48 hour(s)).   Blood Alcohol level:  Lab Results  Component Value Date   ETH <10 06/19/2021   ETH <10 05/31/2021    Metabolic Disorder Labs: Lab Results  Component Value Date   HGBA1C 5.5 06/04/2021   MPG 111.15 06/04/2021   No results found for: PROLACTIN Lab Results  Component Value Date   CHOL 170 06/04/2021   TRIG 68 06/04/2021   HDL 41 06/04/2021   CHOLHDL 4.1 06/04/2021   VLDL 14 06/04/2021   LDLCALC 115 (H) 06/04/2021    Physical Findings: AIMS:  , ,  ,  ,    CIWA:    COWS:     Musculoskeletal: Strength & Muscle Tone: within normal limits Gait & Station: normal Patient leans: N/A  Psychiatric Specialty Exam:  Presentation  General Appearance: Casual  Eye Contact:Good  Speech:Clear and Coherent  Speech Volume:Normal  Handedness:Right   Mood and Affect  Mood:Irritable  Affect:Blunt   Thought Process  Thought Processes:Disorganized  Descriptions of Associations:Loose  Orientation:Full (Time, Place and Person)  Thought Content:Illogical; Paranoid Ideation; Delusions; Scattered; Perseveration  History of Schizophrenia/Schizoaffective disorder:Yes  Duration of Psychotic Symptoms:Greater than six months  Hallucinations:No data recorded  Ideas of Reference:Delusions; Paranoia  Suicidal Thoughts:No data recorded  Homicidal Thoughts:No data recorded   Sensorium  Memory:Immediate Good; Recent Poor; Remote Poor (Delusional)  Judgment:Impaired  Insight:Lacking   Executive Functions  Concentration:Poor  Attention  Span:Poor  Recall:Poor  Fund of Knowledge:Good  Language:Good   Psychomotor Activity  Psychomotor Activity:No data recorded   Assets  Assets:Resilience; Physical Health; Social Support; Health and safety inspector; Housing   Sleep  Sleep:No data recorded  Blood pressure 135/87, pulse 68, temperature 98.4 F (36.9 C), temperature source Oral, resp. rate 18, height 4\' 9"  (1.448 m), weight 66.2 kg, last menstrual period 06/02/2021, SpO2 98 %. Body mass index is 31.59 kg/m.   Treatment Plan Summary: Daily contact with patient to assess and evaluate symptoms and progress in treatment and Medication management  Bipolar disorder with psychotic features,  r/o Schizophrenia - Zyprexa discontinued due to lack of efficacy - Continue Haldol 15 mg liquid PO or 10mg  IM. Titrate to effect with plan to transition to long acting injectable  -Continue Cogentin 0.5 mg po daily at bedtime for EPS prophylaxis   Tachycardia -Continue metoprolol tartrate 25 mg po twice daily, patient has been refusing the 2nd dose of the day. Her HR is 68 today.    Hypokalemia -potassium chloride CR tablet 40 mEQ ONCE (Completed 06/20/21) - K was 3.2 on 06/19/21   PRN agitation -Continue Geodon 20 mg every 8 hr IM -Continue Zyprexa 10 mg every 6 hr po or IM  Nausea/vomiting -Continue Zofran tablet 8 mg every 8 hours prn   PRN, Other  -Continue Tylenol 650 mg po every 6 hrs prn pain -Continue MAALOX/MYLANTA 30 mL po every 4 hrs prn indigestion -Continue Milk of Magnesia 30 mL po daily prn constipation       Athony Coppa, MD 06/25/2021, 1:55 PM

## 2021-06-25 NOTE — Progress Notes (Signed)
Patient attended group.  She remained in day room several times today.  She refused her am BP medication but took it in afternoon.  Pt denies SI/HI or anxiety/depression.

## 2021-06-25 NOTE — [facility] Group Notes (Signed)
[facility] LCSW Group Therapy Note  Date/Time:  06/25/2021 1:28 PM- 2:09 PM   Type of Therapy and Topic:  Group Therapy:  Healthy and Unhealthy Supports  Participation Level:  Active   Description of Group:  Patients in this group were introduced to the idea of adding a variety of healthy supports to address the various needs in their lives.Patients discussed what additional healthy supports could be helpful in their recovery and wellness after discharge in order to prevent future hospitalizations.   An emphasis was placed on using counselor, doctor, therapy groups, 12-step groups, and problem-specific support groups to expand supports.  They also worked as a group on developing a specific plan for several patients to deal with unhealthy supports through boundary-setting, psychoeducation with loved ones, and even termination of relationships.   Therapeutic Goals:   1)  discuss importance of adding supports to stay well once out of the hospital  2)  compare healthy versus unhealthy supports and identify some examples of each  3)  generate ideas and descriptions of healthy supports that can be added  4)  offer mutual support about how to address unhealthy supports  5)  encourage active participation in and adherence to discharge plan    Summary of Patient Progress:  Patient was active in group and spoke about having a hard time asking for help. Patient stated she prays for help but does not ask anyone for support. Patient stated that God is her support and feels like God is the only person she could go to. Patient spoke about an unhealthy support but did not go into details of who that person is and why she considers them unhealthy. Patient was able to identify [facility] as a healthy support.    Therapeutic Modalities:   Motivational Interviewing Brief Solution-Focused Therapy  Susa Simmonds, Theresia Majors 06/25/2021  2:32 PM

## 2021-06-26 MED ORDER — METOPROLOL TARTRATE 25 MG PO TABS
25.0000 mg | ORAL_TABLET | Freq: Once | ORAL | Status: AC
  Administered 2021-06-26: 25 mg via ORAL
  Filled 2021-06-26: qty 1

## 2021-06-26 MED ORDER — METOPROLOL SUCCINATE ER 25 MG PO TB24
50.0000 mg | ORAL_TABLET | Freq: Every day | ORAL | Status: DC
  Administered 2021-06-27 – 2021-06-30 (×3): 50 mg via ORAL
  Filled 2021-06-26 (×4): qty 2

## 2021-06-26 NOTE — Progress Notes (Signed)
Pt c/o of bilateral knee pain at 7/10. Tylenol 650 mg PRN given.

## 2021-06-26 NOTE — Progress Notes (Signed)
Patient has been playing cards with peer on the unit. Has a slightly bizarre affect. Requesting nicorette gum. Denies SI HI and AVH. Slightly suspicious when given liquid Haldol, but took it when reminded she has a forced medication order.

## 2021-06-26 NOTE — [facility] Group Notes (Signed)
LCSW Group Therapy Note     06/26/2021 3:12 PM     Type of Therapy and Topic:  Group Therapy: Coping Skills     Participation Level:  Active       Description of Group: In this group, patients will learn about coping strategies. Patients will define what a coping strategy is and discuss how utilizing coping strategies can aid in emotional regulation and assist in the management of stress, anxiety, and depression symptoms. Patients will identify coping strategies that have helped them manage challenging emotions in the past and explore new coping strategies that they can begin to implement during times of emotional distress. Patients will learn the difference between positive and negative coping strategies and be encouraged to identify healthy replacements for unhelpful behaviors. Patients will be facilitated through the practice of a grounding technique to add to their coping strategy toolbox.         Therapeutic Goals:  1. Patient will be able to define what a coping strategy is and understand the importance of using coping skills for emotional well-being.  2. Patient will identify three coping strategies that they can practice when experiencing feelings of stress, anxiety, anger, and/or depression.  3. Patient will identify one unhelpful coping strategy they have used prior to admission and identify a healthy replacement they can practice instead.  4. Patient will learn one new grounding technique they can practice after discharge to aid in emotional regulation.      Summary of Patient Progress: Patient was present for the entirety of the group session. Patient participated in the topic of discussion despite stating that she was feeling frustrated during the [employer]. Patient appeared tired at the start of group, closing her eyes, but participated as the group progressed. Patient identified keeping to herself, writing, and cleaning as coping strategies that help her.  Patient stated that sharing how she feels on social media can be unhelpful. Patient stated that trying not to be so private and learning to ask for help are two coping strategies she would like to practice.       Therapeutic Modalities:  Cognitive Behavioral Therapy Relapse Prevention Therapy Solution-Focused Therapy    Norberto Sorenson, LCSWA 06/26/2021 3:12 PM

## 2021-06-26 NOTE — Progress Notes (Addendum)
[facility] MD Progress Note  06/26/2021 11:34 AM Tricia Kerr  MRN:  [id number]  CC: " can we talk about my release date!"   Subjective:   Tricia Kerr is a 35 year old female with a history of bipolar disorder with psychotic features.  She was admitted involuntarily, presenting to the ED via police.    Staff reported No acute events overnight. She has been medication compliant except the 2nd dose of metoprolol,  and attending to ADLs.   She is on NEFM.   Today, she focused on her discharge date, still has no insight of her mental illness.  She said that she spoke with her husband yesterday and was not happy that "Keymon Mcelroy refused to come and get me!'  She said that she doesn't know if she is legally married, because she doesn't think she "signed the paper" in the court house.  She suspects that her husband signed the marriage paper behind her back.   She continues not to believe that she is the mom of the children she gave birth to.   No SI or HI.   Principal Problem: Bipolar affective disorder, current episode manic with psychotic symptoms (HCC) Diagnosis: Principal Problem:   Bipolar affective disorder, current episode manic with psychotic symptoms (HCC)  Total Time spent with patient: 20 minutes  Past Psychiatric History:  See H&P  Past Medical History:  Past Medical History:  Diagnosis Date   Anxiety    Bipolar disorder (HCC)     Past Surgical History:  Procedure Laterality Date   APPENDECTOMY     TUBAL LIGATION     tubial     Family History: History reviewed. No pertinent family history. Family Psychiatric  History: Unknown/Denies Social History:  Social History   Substance and Sexual Activity  Alcohol Use Yes     Social History   Substance and Sexual Activity  Drug Use Yes   Types: Marijuana   Comment: months    Social History   Socioeconomic History   Marital status: Divorced    Spouse name: Not on file   Number of children: Not on file   Years of education:  Not on file   Highest education level: Not on file  Occupational History   Not on file  Tobacco Use   Smoking status: Every Day    Packs/day: 1.00    Types: Cigarettes   Smokeless tobacco: Never  Substance and Sexual Activity   Alcohol use: Yes   Drug use: Yes    Types: Marijuana    Comment: months   Sexual activity: Not Currently  Other Topics Concern   Not on file  Social History Narrative   Not on file   Social Determinants of Health   Financial Resource Strain: Not on file  Food Insecurity: Not on file  Transportation Needs: Not on file  Physical Activity: Not on file  Stress: Not on file  Social Connections: Not on file   Additional Social History:      Sleep: Fair  Appetite:  Good  Current Medications: Current Facility-Administered Medications  Medication Dose Route Frequency Provider Last Rate Last Admin   acetaminophen (TYLENOL) tablet 650 mg  650 mg Oral Q6H PRN Clapacs, John T, MD   650 mg at 06/26/21 1116   alum & mag hydroxide-simeth (MAALOX/MYLANTA) 200-200-20 MG/5ML suspension 30 mL  30 mL Oral Q4H PRN Clapacs, John T, MD       benztropine (COGENTIN) tablet 0.5 mg  0.5 mg Oral QHS Clapacs, John T,  MD   0.5 mg at 06/25/21 2131   haloperidol (HALDOL) 2 MG/ML solution 15 mg  15 mg Oral QHS Jesse Sans, MD   15 mg at 06/25/21 2131   Or   haloperidol lactate (HALDOL) injection 10 mg  10 mg Intramuscular QHS Jesse Sans, MD       magnesium hydroxide (MILK OF MAGNESIA) suspension 30 mL  30 mL Oral Daily PRN Clapacs, Jackquline Denmark, MD       metoprolol tartrate (LOPRESSOR) tablet 25 mg  25 mg Oral BID Clapacs, John T, MD   25 mg at 06/26/21 0651   OLANZapine (ZYPREXA) tablet 10 mg  10 mg Oral Q6H PRN Vanetta Mulders, NP       ondansetron [facility]) tablet 8 mg  8 mg Oral Q8H PRN Jesse Sans, MD   8 mg at 06/21/21 1218   ziprasidone (GEODON) injection 20 mg  20 mg Intramuscular Q8H PRN Vanetta Mulders, NP        Lab Results:  No results found for  this or any previous visit (from the past 48 hour(s)).   Blood Alcohol level:  Lab Results  Component Value Date   ETH <10 06/19/2021   ETH <10 05/31/2021    Metabolic Disorder Labs: Lab Results  Component Value Date   HGBA1C 5.5 06/04/2021   MPG 111.15 06/04/2021   No results found for: PROLACTIN Lab Results  Component Value Date   CHOL 170 06/04/2021   TRIG 68 06/04/2021   HDL 41 06/04/2021   CHOLHDL 4.1 06/04/2021   VLDL 14 06/04/2021   LDLCALC 115 (H) 06/04/2021    Physical Findings: AIMS:  , ,  ,  ,    CIWA:    COWS:     Musculoskeletal: Strength & Muscle Tone: within normal limits Gait & Station: normal Patient leans: N/A  Psychiatric Specialty Exam:  Presentation  General Appearance: Casual  Eye Contact:Good  Speech:Clear and Coherent  Speech Volume:Normal  Handedness:Right   Mood and Affect  Mood:Irritable  Affect:Blunt   Thought Process  Thought Processes:Disorganized  Descriptions of Associations:Loose  Orientation:Full (Time, Place and Person)  Thought Content:Illogical; Paranoid Ideation; Delusions; Scattered; Perseveration  History of Schizophrenia/Schizoaffective disorder:Yes  Duration of Psychotic Symptoms:Greater than six months  Hallucinations:No data recorded  Ideas of Reference:Delusions; Paranoia  Suicidal Thoughts:No data recorded  Homicidal Thoughts:No data recorded   Sensorium  Memory:Immediate Good; Recent Poor; Remote Poor (Delusional)  Judgment:Impaired  Insight:Lacking   Executive Functions  Concentration:Poor  Attention Span:Poor  Recall:Poor  Fund of Knowledge:Good  Language:Good   Psychomotor Activity  Psychomotor Activity:No data recorded   Assets  Assets:Resilience; Physical Health; Social Support; Health and safety inspector; Housing   Sleep  Sleep:No data recorded  Blood pressure (!) 152/92, pulse (!) 145, temperature 98.9 F (37.2 C), temperature source Oral, resp. rate  18, height 4\' 9"  (1.448 m), weight 66.2 kg, last menstrual period 06/02/2021, SpO2 97 %. Body mass index is 31.59 kg/m.   Treatment Plan Summary: Daily contact with patient to assess and evaluate symptoms and progress in treatment and Medication management  Bipolar disorder with psychotic features, r/o Schizophrenia - Zyprexa discontinued due to lack of efficacy - Continue Haldol 15 mg liquid PO or 10mg  IM. Titrate to effect with plan to transition to long acting injectable  -Continue Cogentin 0.5 mg po daily at bedtime for EPS prophylaxis   Tachycardia -Continue metoprolol tartrate 25 mg po twice daily, patient has been refusing the 2nd dose of the day.  Her BP is increased to 152/92 and HR is 145 at 6:30 AM, and rechecked to be 148/105 with HR of 97.  Thus, pt was given another dose of metoprolol tartrate 25mg  (2nd dose today), and rechecked 2 hrs later, and they were down to  143/90 with RH of 92.  Thus, will order 3rd dose of 25mg  tonight.   - change metoprolol tartrate to metoprolol ER 50mg  daily to increase compliance, starting tomorrow.    Hypokalemia -potassium chloride CR tablet 40 mEQ ONCE (Completed 06/20/21) - K was 3.2 on 06/19/21   PRN agitation -Continue Geodon 20 mg every 8 hr IM -Continue Zyprexa 10 mg every 6 hr po or IM  Nausea/vomiting -Continue Zofran tablet 8 mg every 8 hours prn   PRN, Other  -Continue Tylenol 650 mg po every 6 hrs prn pain -Continue MAALOX/MYLANTA 30 mL po every 4 hrs prn indigestion -Continue Milk of Magnesia 30 mL po daily prn constipation       Jamelle Goldston, MD 06/26/2021, 11:34 AM

## 2021-06-26 NOTE — Plan of Care (Signed)
  Problem: Education: Goal: Knowledge of [facility] Education information/materials will improve Outcome: Progressing Goal: Emotional status will improve Outcome: Progressing Goal: Mental status will improve Outcome: Progressing Goal: Verbalization of understanding the information provided will improve Outcome: Progressing   Problem: Activity: Goal: Interest or engagement in activities will improve Outcome: Progressing Goal: Sleeping patterns will improve Outcome: Progressing   Problem: Coping: Goal: Ability to verbalize frustrations and anger appropriately will improve Outcome: Progressing Goal: Ability to demonstrate self-control will improve Outcome: Progressing   Problem: Health Behavior/Discharge Planning: Goal: Identification of resources available to assist in meeting health care needs will improve Outcome: Progressing Goal: Compliance with treatment plan for underlying cause of condition will improve Outcome: Progressing   Problem: Physical Regulation: Goal: Ability to maintain clinical measurements within normal limits will improve Outcome: Progressing   Problem: Safety: Goal: Periods of time without injury will increase Outcome: Progressing   Problem: Activity: Goal: Will verbalize the importance of balancing activity with adequate rest periods Outcome: Progressing   Problem: Education: Goal: Will be free of psychotic symptoms Outcome: Progressing Goal: Knowledge of the prescribed therapeutic regimen will improve Outcome: Progressing   Problem: Coping: Goal: Coping ability will improve Outcome: Progressing Goal: Will verbalize feelings Outcome: Progressing   Problem: Health Behavior/Discharge Planning: Goal: Compliance with prescribed medication regimen will improve Outcome: Progressing   Problem: Nutritional: Goal: Ability to achieve adequate nutritional intake will improve Outcome: Progressing   Problem: Role Relationship: Goal:  Ability to communicate needs accurately will improve Outcome: Progressing Goal: Ability to interact with others will improve Outcome: Progressing   Problem: Safety: Goal: Ability to redirect hostility and anger into socially appropriate behaviors will improve Outcome: Progressing Goal: Ability to remain free from injury will improve Outcome: Progressing   Problem: Self-Care: Goal: Ability to participate in self-care as condition permits will improve Outcome: Progressing   Problem: Self-Concept: Goal: Will verbalize positive feelings about self Outcome: Progressing   

## 2021-06-26 NOTE — [facility] Group Notes (Signed)
[facility] Group Notes:  (Nursing/MHT/Case Management/Adjunct)  Date:  06/26/2021  Time:  9:10 PM  Type of Therapy:  Group Therapy  Participation Level:  Active  Participation Quality:  Appropriate  Affect:  Appropriate  Cognitive:  Alert  Insight:  Good  Engagement in Group:  Engaged  Modes of Intervention:  Support  Summary of Progress/Problems:  Tricia Kerr 06/26/2021, 9:10 PM

## 2021-06-26 NOTE — Progress Notes (Addendum)
Patient ID: Tricia Kerr, female   DOB: June 11, 1986, 35 y.o.   MRN: [id number]    Pt was in bed asleep for most of the morning. Pt reports that she only ate a small amount of her breakfast but she did all of her lunch. Pt denied SI/HI, and AVH during assessment. Pt did c/o bilateral knee pain in which she received PRN Tylenol, as noted in her MAR. Pt's BP and pulse were elevated this morning and the MD was made aware. MD increased her morning dose of metoprolol and ordered another evening dose to help keep her BP WNL. Education, support, and encouragement provided. Pt denies any concerns at this time and verbally contracts for safety. Pt ambulating on the unit with no issues. Pt remains safe on the unit.

## 2021-06-26 NOTE — Plan of Care (Signed)
  Problem: Activity: Goal: Interest or engagement in activities will improve Outcome: Progressing Note: Pt attended group therapy today to work on Pharmacologist.   Problem: Health Behavior/Discharge Planning: Goal: Compliance with treatment plan for underlying cause of condition will improve Outcome: Progressing Note: Pt has been taking her scheduled medications but has been been refusing her second daily dose of Lopressor.   Problem: Safety: Goal: Periods of time without injury will increase Outcome: Progressing Note: Pt denied SI/HI during RN assessment.   Problem: Education: Goal: Will be free of psychotic symptoms Outcome: Progressing Note: Pt denied AVH during RN assessment.

## 2021-06-27 DIAGNOSIS — F312 Bipolar disorder, current episode manic severe with psychotic features: Secondary | ICD-10-CM | POA: Diagnosis not present

## 2021-06-27 MED ORDER — HALOPERIDOL LACTATE 5 MG/ML IJ SOLN: 10.0000 mg | Freq: Every day | INTRAMUSCULAR | Status: DC

## 2021-06-27 MED ORDER — HALOPERIDOL LACTATE 2 MG/ML PO CONC
20.0000 mg | Freq: Every day | ORAL | Status: DC
  Administered 2021-06-27 – 2021-06-29 (×3): 20 mg via ORAL
  Filled 2021-06-27 (×3): qty 10

## 2021-06-27 NOTE — Progress Notes (Signed)
[facility] MD Progress Note  06/27/2021 1:01 PM Tricia Kerr  MRN:  [id number]  CC: "I am not taking any antipsychotic medication. I only take blood pressure and sleep medication."    Subjective:   Tricia Kerr is a 35 year old female with a history of bipolar disorder with psychotic features.  She was admitted involuntarily, presenting to the ED via police.  Patient's husband pettioned for involuntary commitment due to her threatening behaviors and delusions.  Per nursing, patient was overheard telling a peer that she wanted to go camping out in the woods with her cigarettes and has everyone else leave her alone. Patient was interviewed by this NP today.  No behavioral incidents overnight.  Patient has been compliant with medications.  Patient appears a bit calmer today, smiling as she is staring at Clinical research associate.  She states "Maybe I was thinking some things that were not true.  Can I go home now?"  Patient denies suicidal ideation or homicidal ideations.  Denies auditory or visual hallucinations.  When asked about taking antipsychotic, patient states that she does not take antipsychotic, only blood pressure and sleep medications.  Discussed with patient that she is indeed taking antipsychotic and this is what she needs.  Patient states she already tried that at [facility], stating it was "the steroid injection" and it did not help.  Writer attempted to explain delusional thinking and that antipsychotics help with that.  Again, patient states that she felt "horrible" on those medications that she got at [facility].  Writer informed patient that she is taking Haldol here and that she has told me today that she "feels great."  Writer asked that she will be going home with her husband and children when she leaves here.  Patient states awkwardly "yes."  Writer attempted to engage patient to talk about her children and patient declined.  Patient informed that she will not be leaving today.  Writer spoke with husband, he states  that patient calls him about 15 times a day telling him to come and get her.  Husband told patient that she cannot see the kids until she is better.  Husband states that patient told him "I do not give a fuck about you or the kids."    Principal Problem: Bipolar affective disorder, current episode manic with psychotic symptoms (HCC) Diagnosis: Principal Problem:   Bipolar affective disorder, current episode manic with psychotic symptoms (HCC)  Total Time spent with patient: 15 minutes  Past Psychiatric History:  Past history of prior episodes of psychosis.  She has been admitted numerous times since childhood.  During hospital 2016 she was discharged with Risperdal.  She was followed up with [facility] but then discontinued treatment. She was last admitted at this hospital June 2022, discharged June 23. She was stabilized on Zyprexa, 15 mg nightly.  She has not been compliant with her medications.  Past Medical History:  Past Medical History:  Diagnosis Date   Anxiety    Bipolar disorder [facility])     Past Surgical History:  Procedure Laterality Date   APPENDECTOMY     TUBAL LIGATION     tubial     Family History: History reviewed. No pertinent family history. Family Psychiatric  History: Unknown/Denies Social History:  Social History   Substance and Sexual Activity  Alcohol Use Yes     Social History   Substance and Sexual Activity  Drug Use Yes   Types: Marijuana   Comment: months    Social History   Socioeconomic History  Marital status: Divorced    Spouse name: Not on file   Number of children: Not on file   Years of education: Not on file   Highest education level: Not on file  Occupational History   Not on file  Tobacco Use   Smoking status: Every Day    Packs/day: 1.00    Types: Cigarettes   Smokeless tobacco: Never  Substance and Sexual Activity   Alcohol use: Yes   Drug use: Yes    Types: Marijuana    Comment: months   Sexual activity: Not Currently  Other  Topics Concern   Not on file  Social History Narrative   Not on file   Social Determinants of Health   Financial Resource Strain: Not on file  Food Insecurity: Not on file  Transportation Needs: Not on file  Physical Activity: Not on file  Stress: Not on file  Social Connections: Not on file   Additional Social History:      Sleep: Fair  Appetite:  Good  Current Medications: Current Facility-Administered Medications  Medication Dose Route Frequency Provider Last Rate Last Admin   acetaminophen (TYLENOL) tablet 650 mg  650 mg Oral Q6H PRN Clapacs, John T, MD   650 mg at 06/26/21 1116   alum & mag hydroxide-simeth (MAALOX/MYLANTA) 200-200-20 MG/5ML suspension 30 mL  30 mL Oral Q4H PRN Clapacs, Jackquline Denmark, MD       benztropine (COGENTIN) tablet 0.5 mg  0.5 mg Oral QHS Clapacs, John T, MD   0.5 mg at 06/26/21 2056   haloperidol (HALDOL) 2 MG/ML solution 20 mg  20 mg Oral QHS Jesse Sans, MD       Or   haloperidol lactate (HALDOL) injection 10 mg  10 mg Intramuscular QHS Jesse Sans, MD       magnesium hydroxide (MILK OF MAGNESIA) suspension 30 mL  30 mL Oral Daily PRN Clapacs, Jackquline Denmark, MD       metoprolol succinate (TOPROL-XL) 24 hr tablet 50 mg  50 mg Oral Daily He, Jun, MD   50 mg at 06/27/21 0753   OLANZapine (ZYPREXA) tablet 10 mg  10 mg Oral Q6H PRN Gabriel Cirri F, NP       ondansetron (ZOFRAN) tablet 8 mg  8 mg Oral Q8H PRN Jesse Sans, MD   8 mg at 06/21/21 1218   ziprasidone (GEODON) injection 20 mg  20 mg Intramuscular Q8H PRN Gabriel Cirri F, NP        Lab Results:  No results found for this or any previous visit (from the past 48 hour(s)).   Blood Alcohol level:  Lab Results  Component Value Date   ETH <10 06/19/2021   ETH <10 05/31/2021    Metabolic Disorder Labs: Lab Results  Component Value Date   HGBA1C 5.5 06/04/2021   MPG 111.15 06/04/2021   No results found for: PROLACTIN Lab Results  Component Value Date   CHOL 170 06/04/2021    TRIG 68 06/04/2021   HDL 41 06/04/2021   CHOLHDL 4.1 06/04/2021   VLDL 14 06/04/2021   LDLCALC 115 (H) 06/04/2021    Physical Findings: AIMS: Facial and Oral Movements Muscles of Facial Expression: None, normal Lips and Perioral Area: None, normal Jaw: None, normal Tongue: None, normal,Extremity Movements Upper (arms, wrists, hands, fingers): None, normal Lower (legs, knees, ankles, toes): None, normal, Trunk Movements Neck, shoulders, hips: None, normal, Overall Severity Severity of abnormal movements (highest score from questions above): None, normal Incapacitation due  to abnormal movements: None, normal Patient's awareness of abnormal movements (rate only patient's report): No Awareness, Dental Status Current problems with teeth and/or dentures?: No Does patient usually wear dentures?: No  CIWA:    COWS:     Musculoskeletal: Strength & Muscle Tone: within normal limits Gait & Station: normal Patient leans: N/A  Psychiatric Specialty Exam:  Presentation  General Appearance: Appropriate for Environment  Eye Contact:Good  Speech:Clear and Coherent  Speech Volume:Normal  Handedness:Right   Mood and Affect  Mood:Irritable  Affect:Congruent   Thought Process  Thought Processes:Disorganized  Descriptions of Associations:Loose  Orientation:Full (Time, Place and Person)  Thought Content:Illogical  History of Schizophrenia/Schizoaffective disorder:Yes  Duration of Psychotic Symptoms:Greater than six months  Hallucinations:Hallucinations: None (Denies)  Ideas of Reference:None (Denies)  Suicidal Thoughts:Suicidal Thoughts: No (Denies)  Homicidal Thoughts:Homicidal Thoughts: No (Denies)   Sensorium  Memory:Immediate Good  Judgment:Impaired  Insight:None   Executive Functions  Concentration:Fair  Attention Span:Fair  Recall:Fair  Fund of Knowledge:Fair  Language:Good   Psychomotor Activity  Psychomotor Activity:Psychomotor Activity:  Normal   Assets  Assets:Resilience; Physical Health; Social Support; Health and safety inspector; Housing   Sleep  Sleep:Sleep: Good Number of Hours of Sleep: 8    Physical Exam: Physical Exam HENT:     Head: Normocephalic.     Nose: No congestion or rhinorrhea.  Eyes:     General:        Right eye: No discharge.        Left eye: No discharge.  Pulmonary:     Effort: Pulmonary effort is normal.  Musculoskeletal:        General: Normal range of motion.     Cervical back: Normal range of motion.  Neurological:     Mental Status: She is alert and oriented to person, place, and time.  Psychiatric:        Attention and Perception: She is inattentive.        Mood and Affect: Affect is inappropriate.        Speech: Speech is rapid and pressured and tangential.        Behavior: Behavior is hyperactive.        Thought Content: Thought content is paranoid and delusional.        Cognition and Memory: Memory is impaired.        Judgment: Judgment is impulsive and inappropriate.     Comments: No insight into her mental illness   Review of Systems  Psychiatric/Behavioral:  Negative for depression, hallucinations, memory loss, substance abuse and suicidal ideas. The patient is not nervous/anxious and does not have insomnia.        Pt still presents with delusional thinking  All other systems reviewed and are negative. Blood pressure 118/79, pulse 100, temperature 98.6 F (37 C), temperature source Oral, resp. rate 18, height 4\' 9"  (1.448 m), weight 66.2 kg, last menstrual period 06/02/2021, SpO2 98 %. Body mass index is 31.59 kg/m.   Treatment Plan Summary: Daily contact with patient to assess and evaluate symptoms and progress in treatment and Medication management  Update 06/27/21: Patient continues to express delusional thoughts and has no insight into her delusional thinking.  Patient appears a little less manic and may be more agreeable, as she continues to perseverate on  discharge.  Bipolar disorder with psychotic features - Continue Haldol: Increase from 15mg  liquid to 20 mg liquid p.o.OR Haldol 10 mg IM  at bedtime (NEFM if refuses PO). Will titrate to effect with plan to transition to long acting  injectable   -Continue Cogentin 0.5 mg po daily at bedtime for EPS prophylaxis   Tachycardia -Continue metoprolol succinate 24-hour 1 tablet 50 mg po twice daily   Hypokalemia -potassium chloride CR tablet 40 mEQ ONCE (Completed 06/20/21)   PRN agitation -Continue Geodon 20 mg every 8 hr IM -Continue Zyprexa 10 mg every 6 hr po or IM  Nausea/vomiting -Continue Zofran tablet 8 mg every 8 hours prn   PRN, Other  -Continue Tylenol 650 mg po every 6 hrs prn pain -Continue MAALOX/MYLANTA 30 mL po every 4 hrs prn indigestion -Continue Milk of Magnesia 30 mL po daily prn constipation       Vanetta MuldersLouise F Teryn Boerema, NP 06/27/2021, 1:01 PM

## 2021-06-27 NOTE — Plan of Care (Signed)
Patient appears with a fixed smile on her face. Patient stated that she feels " great " today because she prayed to God. Denies SI,HI and AVH. Appetite and energy level good. Visible in the milieu.Appropriate with staff & peers. ADLs maintained. Support and encouragement given.

## 2021-06-27 NOTE — [facility] Group Notes (Signed)
LCSW Group Therapy Note     06/27/2021 2:36 PM     Type of Therapy and Topic:  Group Therapy:  Overcoming Obstacles     Participation Level:  Minimal     Description of Group:     In this group patients will be encouraged to explore what they see as obstacles to their own wellness and recovery. They will be guided to discuss their thoughts, feelings, and behaviors related to these obstacles. The group will process together ways to cope with barriers, with attention given to specific choices patients can make. Each patient will be challenged to identify changes they are motivated to make in order to overcome their obstacles. This group will be process-oriented, with patients participating in exploration of their own experiences as well as giving and receiving support and challenge from other group members.     Therapeutic Goals:  1.    Patient will identify personal and current obstacles as they relate to admission.  2.    Patient will identify barriers that currently interfere with their wellness or overcoming obstacles.  3.    Patient will identify feelings, thought process and behaviors related to these barriers.  4.    Patient will identify two changes they are willing to make to overcome these obstacles:        Summary of Patient Progress: Patient was present for the entirety of the group session. She stated that her main obstacle was privacy. She did not provide additional information or participate in discussion for the remainder of the session.      Therapeutic Modalities:    Cognitive Behavioral Therapy  Solution Focused Therapy  Motivational Interviewing  Relapse Prevention Therapy     Bobak Oguinn [place], MSW, LCSW-A  06/27/2021 2:36 PM

## 2021-06-27 NOTE — Progress Notes (Signed)
Patient has been calm and cooperative. Asking when she can be discharged. Still says she does not know why she is here. Denies SI, HI and AVH. Medication compliant

## 2021-06-27 NOTE — Plan of Care (Signed)
  Problem: Education: Goal: Knowledge of [facility] Education information/materials will improve Outcome: Progressing Goal: Emotional status will improve Outcome: Progressing Goal: Mental status will improve Outcome: Progressing Goal: Verbalization of understanding the information provided will improve Outcome: Progressing   Problem: Activity: Goal: Interest or engagement in activities will improve Outcome: Progressing Goal: Sleeping patterns will improve Outcome: Progressing   Problem: Coping: Goal: Ability to verbalize frustrations and anger appropriately will improve Outcome: Progressing Goal: Ability to demonstrate self-control will improve Outcome: Progressing   Problem: Health Behavior/Discharge Planning: Goal: Identification of resources available to assist in meeting health care needs will improve Outcome: Progressing Goal: Compliance with treatment plan for underlying cause of condition will improve Outcome: Progressing   Problem: Physical Regulation: Goal: Ability to maintain clinical measurements within normal limits will improve Outcome: Progressing   Problem: Safety: Goal: Periods of time without injury will increase Outcome: Progressing   Problem: Activity: Goal: Will verbalize the importance of balancing activity with adequate rest periods Outcome: Progressing   Problem: Education: Goal: Will be free of psychotic symptoms Outcome: Progressing Goal: Knowledge of the prescribed therapeutic regimen will improve Outcome: Progressing   Problem: Coping: Goal: Coping ability will improve Outcome: Progressing Goal: Will verbalize feelings Outcome: Progressing   Problem: Health Behavior/Discharge Planning: Goal: Compliance with prescribed medication regimen will improve Outcome: Progressing   Problem: Nutritional: Goal: Ability to achieve adequate nutritional intake will improve Outcome: Progressing   Problem: Role Relationship: Goal:  Ability to communicate needs accurately will improve Outcome: Progressing Goal: Ability to interact with others will improve Outcome: Progressing   Problem: Safety: Goal: Ability to redirect hostility and anger into socially appropriate behaviors will improve Outcome: Progressing Goal: Ability to remain free from injury will improve Outcome: Progressing   Problem: Self-Care: Goal: Ability to participate in self-care as condition permits will improve Outcome: Progressing   Problem: Self-Concept: Goal: Will verbalize positive feelings about self Outcome: Progressing   

## 2021-06-27 NOTE — Tx Team (Addendum)
Interdisciplinary Treatment and Diagnostic Plan Update  06/27/2021 Time of Session: 8:30AM Laurana Magistro MRN: [id number]  Principal Diagnosis: Bipolar affective disorder, current episode manic with psychotic symptoms (HCC)  Secondary Diagnoses: Principal Problem:   Bipolar affective disorder, current episode manic with psychotic symptoms (HCC)   Current Medications:  Current Facility-Administered Medications  Medication Dose Route Frequency Provider Last Rate Last Admin   acetaminophen (TYLENOL) tablet 650 mg  650 mg Oral Q6H PRN Clapacs, John T, MD   650 mg at 06/26/21 1116   alum & mag hydroxide-simeth (MAALOX/MYLANTA) 200-200-20 MG/5ML suspension 30 mL  30 mL Oral Q4H PRN Clapacs, John T, MD       benztropine (COGENTIN) tablet 0.5 mg  0.5 mg Oral QHS Clapacs, John T, MD   0.5 mg at 06/26/21 2056   haloperidol (HALDOL) 2 MG/ML solution 15 mg  15 mg Oral QHS Jesse Sans, MD   15 mg at 06/26/21 2056   Or   haloperidol lactate (HALDOL) injection 10 mg  10 mg Intramuscular QHS Jesse Sans, MD       magnesium hydroxide (MILK OF MAGNESIA) suspension 30 mL  30 mL Oral Daily PRN Clapacs, Jackquline Denmark, MD       metoprolol succinate (TOPROL-XL) 24 hr tablet 50 mg  50 mg Oral Daily He, Jun, MD   50 mg at 06/27/21 0753   OLANZapine (ZYPREXA) tablet 10 mg  10 mg Oral Q6H PRN Vanetta Mulders, NP       ondansetron (ZOFRAN) tablet 8 mg  8 mg Oral Q8H PRN Jesse Sans, MD   8 mg at 06/21/21 1218   ziprasidone (GEODON) injection 20 mg  20 mg Intramuscular Q8H PRN Vanetta Mulders, NP       PTA Medications: Medications Prior to Admission  Medication Sig Dispense Refill Last Dose   benztropine (COGENTIN) 0.5 MG tablet Take 1 tablet (0.5 mg total) by mouth at bedtime. 30 tablet 1    metoprolol tartrate (LOPRESSOR) 25 MG tablet Take 1 tablet (25 mg total) by mouth 2 (two) times daily. 60 tablet 1    OLANZapine (ZYPREXA) 15 MG tablet Take 1 tablet (15 mg total) by mouth at bedtime. 30  tablet 1     Patient Stressors: Medication change or noncompliance  Patient Strengths: Physical Health Supportive family/friends  Treatment Modalities: Medication Management, Group therapy, Case management,  1 to 1 session with clinician, Psychoeducation, Recreational therapy.   Physician Treatment Plan for Primary Diagnosis: Bipolar affective disorder, current episode manic with psychotic symptoms (HCC) Long Term Goal(s): Improvement in symptoms so as ready for discharge   Short Term Goals: Ability to identify changes in lifestyle to reduce recurrence of condition will improve Ability to verbalize feelings will improve Ability to demonstrate self-control will improve Ability to identify and develop effective coping behaviors will improve Ability to maintain clinical measurements within normal limits will improve Compliance with prescribed medications will improve Ability to identify triggers associated with substance abuse/mental health issues will improve  Medication Management: Evaluate patient's response, side effects, and tolerance of medication regimen.  Therapeutic Interventions: 1 to 1 sessions, Unit Group sessions and Medication administration.  Evaluation of Outcomes: Not Progressing  Physician Treatment Plan for Secondary Diagnosis: Principal Problem:   Bipolar affective disorder, current episode manic with psychotic symptoms (HCC)  Long Term Goal(s): Improvement in symptoms so as ready for discharge   Short Term Goals: Ability to identify changes in lifestyle to reduce recurrence of condition will improve Ability to  verbalize feelings will improve Ability to demonstrate self-control will improve Ability to identify and develop effective coping behaviors will improve Ability to maintain clinical measurements within normal limits will improve Compliance with prescribed medications will improve Ability to identify triggers associated with substance abuse/mental health  issues will improve     Medication Management: Evaluate patient's response, side effects, and tolerance of medication regimen.  Therapeutic Interventions: 1 to 1 sessions, Unit Group sessions and Medication administration.  Evaluation of Outcomes: Not Progressing   RN Treatment Plan for Primary Diagnosis: Bipolar affective disorder, current episode manic with psychotic symptoms (HCC) Long Term Goal(s): Knowledge of disease and therapeutic regimen to maintain health will improve  Short Term Goals: Ability to verbalize frustration and anger appropriately will improve, Ability to demonstrate self-control, Ability to participate in decision making will improve, Ability to identify and develop effective coping behaviors will improve, and Compliance with prescribed medications will improve  Medication Management: RN will administer medications as ordered by provider, will assess and evaluate patient's response and provide education to patient for prescribed medication. RN will report any adverse and/or side effects to prescribing provider.  Therapeutic Interventions: 1 on 1 counseling sessions, Psychoeducation, Medication administration, Evaluate responses to treatment, Monitor vital signs and CBGs as ordered, Perform/monitor CIWA, COWS, AIMS and Fall Risk screenings as ordered, Perform wound care treatments as ordered.  Evaluation of Outcomes: Not Progressing   LCSW Treatment Plan for Primary Diagnosis: Bipolar affective disorder, current episode manic with psychotic symptoms (HCC) Long Term Goal(s): Safe transition to appropriate next level of care at discharge, Engage patient in therapeutic group addressing interpersonal concerns.  Short Term Goals: Engage patient in aftercare planning with referrals and resources, Increase social support, Increase ability to appropriately verbalize feelings, Increase emotional regulation, Facilitate acceptance of mental health diagnosis and concerns, Identify  triggers associated with mental health/substance abuse issues, and Increase skills for wellness and recovery  Therapeutic Interventions: Assess for all discharge needs, 1 to 1 time with Social worker, Explore available resources and support systems, Assess for adequacy in community support network, Educate family and significant other(s) on suicide prevention, Complete Psychosocial Assessment, Interpersonal group therapy.  Evaluation of Outcomes: Not Progressing   Progress in Treatment: Attending groups: Yes. Participating in groups: Yes. Taking medication as prescribed: Yes. Toleration medication: Yes. Family/Significant other contact made: Yes, individual(s) contacted:  pt declined Patient understands diagnosis: No. Discussing patient identified problems/goals with staff: No. Medical problems stabilized or resolved: Yes. Denies suicidal/homicidal ideation: Yes. Issues/concerns per patient self-inventory: No. Other: none.  New problem(s) identified: No, Describe:  none.  New Short Term/Long Term Goal(s): elimination of symptoms of psychosis, medication management for mood stabilization; development of comprehensive mental wellness/sobriety plan. Update 06/27/21: No changes at this time.  Patient Goals: "To feel like I'm free to do whatever I want."  Update 06/27/21: No changes at this time  Discharge Plan or Barriers: CSW will assist pt with development of an appropriate aftercare/discharge plan. Update 06/27/21: No changes at this time.   Reason for Continuation of Hospitalization: Aggression Medication stabilization  Estimated Length of Stay: 1-7 days    Attendees: Patient:  06/27/2021 10:03 AM  Physician: Les Pou, MD 06/27/2021 10:03 AM  Nursing:  06/27/2021 10:03 AM  RN Care Manager: 06/27/2021 10:03 AM  Social Worker: Vilma Meckel. Algis Greenhouse, MSW, LCSW, LCAS 06/27/2021 10:03 AM  Recreational Therapist:   06/27/2021 10:03 AM  Other: Penni Homans, MSW, LCSW 06/27/2021 10:03 AM   Other: Atley Neubert [place], MSW, LCSW-A 06/27/2021 10:03 AM  Other: Gabriel Cirri, NP 06/27/2021 10:03 AM    Scribe for Treatment Team: Nuha Degner A [place], LCSWA 06/27/2021 10:03 AM

## 2021-06-27 NOTE — Progress Notes (Signed)
Recreation Therapy Notes   Date: 06/27/2021  Time: 9:30 am   Location: Craft room  Behavioral response: Appropriate   Intervention Topic: Stress Management   Discussion/Intervention:  Group content on today was focused on stress. The group defined stress and way to cope with stress. Participants expressed how they know when they are stresses out. Individuals described the different ways they have to cope with stress. The group stated reasons why it is important to cope with stress. Patient explained what good stress is and some examples. The group participated in the intervention "Stress Management". Individuals were separated into two group and answered questions related to stress.   Clinical Observations/Feedback: Patient came to group and identified walking away and smoking as ways she manages her stress. She expressed that she used to get argumentative and  get physical. Individual was social with peers and staff while participating in the intervention.  Javana Schey LRT/CTRS      Chimaobi Casebolt 06/27/2021 12:02 PM

## 2021-06-28 DIAGNOSIS — F312 Bipolar disorder, current episode manic severe with psychotic features: Secondary | ICD-10-CM | POA: Diagnosis not present

## 2021-06-28 MED ORDER — HALOPERIDOL DECANOATE 100 MG/ML IM SOLN
200.0000 mg | INTRAMUSCULAR | Status: DC
  Administered 2021-06-29: 200 mg via INTRAMUSCULAR
  Filled 2021-06-28 (×3): qty 2

## 2021-06-28 MED ORDER — HALOPERIDOL DECANOATE 100 MG/ML IM SOLN
200.0000 mg | INTRAMUSCULAR | Status: DC
  Filled 2021-06-28: qty 2

## 2021-06-28 NOTE — Progress Notes (Signed)
Patient presents with with an improved affect than when this writer previously had this patient this admission. Pt pleasant during assessment denies SI/HI/AVH. Patient observed interacting appropriately with staff and peers on the unit. Patient compliant with medication administration per MD orders. Patient given education, support and encouragement to be active in her treatment plan. Patient being monitored Q 15 minutes for safety per unit protocol. Pt remains safe on the unit

## 2021-06-28 NOTE — Plan of Care (Signed)
  Problem: Education: Goal: Knowledge of [facility] Education information/materials will improve Outcome: Progressing Goal: Emotional status will improve Outcome: Progressing Goal: Mental status will improve Outcome: Progressing Goal: Verbalization of understanding the information provided will improve Outcome: Progressing   Problem: Activity: Goal: Interest or engagement in activities will improve Outcome: Progressing Goal: Sleeping patterns will improve Outcome: Progressing   Problem: Coping: Goal: Ability to verbalize frustrations and anger appropriately will improve Outcome: Progressing Goal: Ability to demonstrate self-control will improve Outcome: Progressing   Problem: Health Behavior/Discharge Planning: Goal: Identification of resources available to assist in meeting health care needs will improve Outcome: Progressing Goal: Compliance with treatment plan for underlying cause of condition will improve Outcome: Progressing   Problem: Physical Regulation: Goal: Ability to maintain clinical measurements within normal limits will improve Outcome: Progressing   Problem: Safety: Goal: Periods of time without injury will increase Outcome: Progressing   Problem: Activity: Goal: Will verbalize the importance of balancing activity with adequate rest periods Outcome: Progressing   Problem: Education: Goal: Will be free of psychotic symptoms Outcome: Progressing Goal: Knowledge of the prescribed therapeutic regimen will improve Outcome: Progressing   Problem: Coping: Goal: Coping ability will improve Outcome: Progressing Goal: Will verbalize feelings Outcome: Progressing   Problem: Health Behavior/Discharge Planning: Goal: Compliance with prescribed medication regimen will improve Outcome: Progressing   Problem: Nutritional: Goal: Ability to achieve adequate nutritional intake will improve Outcome: Progressing   Problem: Role Relationship: Goal:  Ability to communicate needs accurately will improve Outcome: Progressing Goal: Ability to interact with others will improve Outcome: Progressing   Problem: Safety: Goal: Ability to redirect hostility and anger into socially appropriate behaviors will improve Outcome: Progressing Goal: Ability to remain free from injury will improve Outcome: Progressing   Problem: Self-Care: Goal: Ability to participate in self-care as condition permits will improve Outcome: Progressing   Problem: Self-Concept: Goal: Will verbalize positive feelings about self Outcome: Progressing   

## 2021-06-28 NOTE — [facility] Group Notes (Signed)
LCSW Group Therapy Note  06/28/2021 2:05 PM  Type of Therapy/Topic:  Group Therapy:  Feelings about Diagnosis  Participation Level:  Did Not Attend   Description of Group:   This group will allow patients to explore their thoughts and feelings about diagnoses they have received. Patients will be guided to explore their level of understanding and acceptance of these diagnoses. Facilitator will encourage patients to process their thoughts and feelings about the reactions of others to their diagnosis and will guide patients in identifying ways to discuss their diagnosis with significant others in their lives. This group will be process-oriented, with patients participating in exploration of their own experiences, giving and receiving support, and processing challenge from other group members.   Therapeutic Goals: Patient will demonstrate understanding of diagnosis as evidenced by identifying two or more symptoms of the disorder Patient will be able to express two feelings regarding the diagnosis Patient will demonstrate their ability to communicate their needs through discussion and/or role play  Summary of Patient Progress: X  Therapeutic Modalities:   Cognitive Behavioral Therapy Brief Therapy Feelings Identification   Arriel Victor R. Arsen Mangione, MSW, LCSW, LCAS 06/28/2021 2:05 PM   

## 2021-06-28 NOTE — Plan of Care (Signed)
  Problem: Coping Skills Goal: STG - Patient will identify 3 positive coping skills strategies to use post d/c within 5 recreation therapy group sessions Description: STG - Patient will identify 3 positive coping skills strategies to use post d/c within 5 recreation therapy group sessions Outcome: Progressing   

## 2021-06-28 NOTE — Progress Notes (Signed)
Patient is bright, pleasant but superficial. Socializing with peers on the unit. Alludes to having some anxiety but will not elaborate. Superficial. Medication compliant. Denies SI, HI and AVH

## 2021-06-28 NOTE — Plan of Care (Signed)
Patient presents with a better affect than when this writer had her earlier this admission  Problem: Education: Goal: Emotional status will improve Outcome: Progressing Goal: Mental status will improve Outcome: Progressing

## 2021-06-28 NOTE — Progress Notes (Signed)
Recreation Therapy Notes  Date: 06/28/2021  Time: 9:30 am   Location: Craft room  Behavioral response: Appropriate   Intervention Topic: Coping skills  Discussion/Intervention:  Group content on today was focused on coping skills. The group defined what coping skills are and when they normally use coping skills. Individuals described how they normally cope with thing and the coping skills they normally use. Patients expressed why it is important to cope with things and how not coping with things can affect you. The group participated in the intervention "My coping box" and made coping boxes while adding coping skills they could use in the future to the box. Clinical Observations/Feedback: Patient came to group and identified deep breathing, positive self talk and journaling as coping skills she participates in. Individual was social with peers and staff while participating in the intervention.  Lazaro Isenhower LRT/CTRS          Azure Barrales 06/28/2021 12:02 PM

## 2021-06-28 NOTE — Plan of Care (Signed)
Patient states " I am willing to do whatever the doctor wants me to do." Patient talks with some insight.States " I have problem to get along with my husband but I will try to stay together for my kids." Patient verbalized that she will stay on her medicines. In and out of her room. Appropriate with staff & peers. Denies SI,HI and AVH. Support and encouragement given.

## 2021-06-28 NOTE — Progress Notes (Addendum)
[facility] MD Progress Note  06/28/2021 3:30 PM Tricia Kerr  MRN:  [id number]  CC: "I will do anything I need to do to get out of here."    Subjective:   Tricia Kerr is a 35 year old female with a history of bipolar disorder with psychotic features.  She was admitted involuntarily, presenting to the ED via police.  Patient's husband pettioned for involuntary commitment due to her threatening behaviors and delusions.  Patient was seen and interviewed this afternoon.  She was approached as she was laying down in her room.  She was quite tearful, but calm and cooperative.  She states that she feels like her husband wants her to be here so he can "do what he wants."  She continues to deny suicidal or homicidal ideations, or visual or auditory hallucinations.  She does admit to having some thoughts that might be "out there", but denies that they are necessarily delusions.  She states that she still thinks that her children are not from her eggs, but since she carried them and has raised them she just wants to forget about it.  When approached with that subject of keeping the kids out of the house for long periods of time in the heat, which is dangerous, patient minimizes this, stating that she still believes something was making them sick in the house.  Discussed long acting injectable (Haldol decanoate) and patient is willing to do this in order to be able to be discharged as soon as possible.  Discussed with patient that even with the injection she is going to be here several more days at least.  Patient has been compliant with her medications.  No EPS has been noted.  She has attended some groups with minimal participation in some and participation and interacting with peers and staff in other groups.  She is attending to ADLs.  Reports good sleep and appetite.  Support and encouragement provided to patient.   Principal Problem: Bipolar affective disorder, current episode manic with psychotic symptoms  (HCC) Diagnosis: Principal Problem:   Bipolar affective disorder, current episode manic with psychotic symptoms (HCC)  Total Time spent with patient: 15 minutes  Past Psychiatric History:  Past history of prior episodes of psychosis.  She has been admitted numerous times since childhood.  During hospital 2016 she was discharged with Risperdal.  She was followed up with [facility] but then discontinued treatment. She was last admitted at this hospital June 2022, discharged June 23. She was stabilized on Zyprexa, 15 mg nightly.  She has not been compliant with her medications.  Past Medical History:  Past Medical History:  Diagnosis Date   Anxiety    Bipolar disorder [facility])     Past Surgical History:  Procedure Laterality Date   APPENDECTOMY     TUBAL LIGATION     tubial     Family History: History reviewed. No pertinent family history. Family Psychiatric  History: Unknown/Denies Social History:  Social History   Substance and Sexual Activity  Alcohol Use Yes     Social History   Substance and Sexual Activity  Drug Use Yes   Types: Marijuana   Comment: months    Social History   Socioeconomic History   Marital status: Divorced    Spouse name: Not on file   Number of children: Not on file   Years of education: Not on file   Highest education level: Not on file  Occupational History   Not on file  Tobacco Use  Smoking status: Every Day    Packs/day: 1.00    Types: Cigarettes   Smokeless tobacco: Never  Substance and Sexual Activity   Alcohol use: Yes   Drug use: Yes    Types: Marijuana    Comment: months   Sexual activity: Not Currently  Other Topics Concern   Not on file  Social History Narrative   Not on file   Social Determinants of Health   Financial Resource Strain: Not on file  Food Insecurity: Not on file  Transportation Needs: Not on file  Physical Activity: Not on file  Stress: Not on file  Social Connections: Not on file   Additional Social  History:      Sleep: Fair  Appetite:  Good  Current Medications: Current Facility-Administered Medications  Medication Dose Route Frequency Provider Last Rate Last Admin   acetaminophen (TYLENOL) tablet 650 mg  650 mg Oral Q6H PRN Clapacs, Jackquline Denmark, MD   650 mg at 06/26/21 1116   alum & mag hydroxide-simeth (MAALOX/MYLANTA) 200-200-20 MG/5ML suspension 30 mL  30 mL Oral Q4H PRN Clapacs, Jackquline Denmark, MD       benztropine (COGENTIN) tablet 0.5 mg  0.5 mg Oral QHS Clapacs, John T, MD   0.5 mg at 06/27/21 2121   haloperidol (HALDOL) 2 MG/ML solution 20 mg  20 mg Oral QHS Jesse Sans, MD   20 mg at 06/27/21 2121   Or   haloperidol lactate (HALDOL) injection 10 mg  10 mg Intramuscular QHS Jesse Sans, MD       [START ON 06/29/2021] haloperidol decanoate (HALDOL DECANOATE) 100 MG/ML injection 200 mg  200 mg Intramuscular Q30 days Jesse Sans, MD       magnesium hydroxide (MILK OF MAGNESIA) suspension 30 mL  30 mL Oral Daily PRN Clapacs, Jackquline Denmark, MD       metoprolol succinate (TOPROL-XL) 24 hr tablet 50 mg  50 mg Oral Daily He, Jun, MD   50 mg at 06/28/21 0758   OLANZapine (ZYPREXA) tablet 10 mg  10 mg Oral Q6H PRN Gabriel Cirri F, NP       ondansetron (ZOFRAN) tablet 8 mg  8 mg Oral Q8H PRN Jesse Sans, MD   8 mg at 06/21/21 1218   ziprasidone (GEODON) injection 20 mg  20 mg Intramuscular Q8H PRN Gabriel Cirri F, NP        Lab Results:  No results found for this or any previous visit (from the past 48 hour(s)).   Blood Alcohol level:  Lab Results  Component Value Date   ETH <10 06/19/2021   ETH <10 05/31/2021    Metabolic Disorder Labs: Lab Results  Component Value Date   HGBA1C 5.5 06/04/2021   MPG 111.15 06/04/2021   No results found for: PROLACTIN Lab Results  Component Value Date   CHOL 170 06/04/2021   TRIG 68 06/04/2021   HDL 41 06/04/2021   CHOLHDL 4.1 06/04/2021   VLDL 14 06/04/2021   LDLCALC 115 (H) 06/04/2021    Physical Findings: AIMS: Facial  and Oral Movements Muscles of Facial Expression: None, normal Lips and Perioral Area: None, normal Jaw: None, normal Tongue: None, normal,Extremity Movements Upper (arms, wrists, hands, fingers): None, normal Lower (legs, knees, ankles, toes): None, normal, Trunk Movements Neck, shoulders, hips: None, normal, Overall Severity Severity of abnormal movements (highest score from questions above): None, normal Incapacitation due to abnormal movements: None, normal Patient's awareness of abnormal movements (rate only patient's report): No Awareness, Dental  Status Current problems with teeth and/or dentures?: No Does patient usually wear dentures?: No  CIWA:    COWS:     Musculoskeletal: Strength & Muscle Tone: within normal limits Gait & Station: normal Patient leans: N/A  Psychiatric Specialty Exam:  Presentation  General Appearance: Appropriate for Environment  Eye Contact:Good  Speech:Clear and Coherent  Speech Volume:Normal  Handedness:Right   Mood and Affect  Mood:Irritable  Affect:Congruent   Thought Process  Thought Processes:Disorganized  Descriptions of Associations:Loose  Orientation:Full (Time, Place and Person)  Thought Content:Illogical  History of Schizophrenia/Schizoaffective disorder:Yes  Duration of Psychotic Symptoms:Greater than six months  Hallucinations:Hallucinations: None (Denies)  Ideas of Reference:None (Denies)  Suicidal Thoughts:Suicidal Thoughts: No (Denies)  Homicidal Thoughts:Homicidal Thoughts: No (Denies)   Sensorium  Memory:Immediate Good  Judgment:Impaired  Insight:None   Executive Functions  Concentration:Fair  Attention Span:Fair  Recall:Fair  Fund of Knowledge:Fair  Language:Good   Psychomotor Activity  Psychomotor Activity:Psychomotor Activity: Normal   Assets  Assets:Resilience; Physical Health; Social Support; Health and safety inspectorinancial Resources/Insurance; Housing   Sleep  Sleep:Sleep: Good Number of Hours  of Sleep: 8.25    Physical Exam: Physical Exam HENT:     Head: Normocephalic.     Nose: No congestion or rhinorrhea.  Eyes:     General:        Right eye: No discharge.        Left eye: No discharge.  Pulmonary:     Effort: Pulmonary effort is normal.  Musculoskeletal:        General: Normal range of motion.     Cervical back: Normal range of motion.  Neurological:     Mental Status: She is alert and oriented to person, place, and time.  Psychiatric:        Attention and Perception: She is inattentive.        Mood and Affect: Affect is inappropriate.        Speech: Speech is rapid and pressured and tangential.        Behavior: Behavior is hyperactive.        Thought Content: Thought content is paranoid and delusional.        Cognition and Memory: Memory is impaired.        Judgment: Judgment is impulsive and inappropriate.     Comments: No insight into her mental illness   Review of Systems  Psychiatric/Behavioral:  Negative for depression, hallucinations, memory loss, substance abuse and suicidal ideas. The patient is not nervous/anxious and does not have insomnia.        Pt still presents with delusional thinking  All other systems reviewed and are negative. Blood pressure 108/82, pulse 96, temperature 98.9 F (37.2 C), temperature source Oral, resp. rate 17, height 4\' 9"  (1.448 m), weight 66.2 kg, last menstrual period 06/02/2021, SpO2 99 %. Body mass index is 31.59 kg/m.   Treatment Plan Summary: Daily contact with patient to assess and evaluate symptoms and progress in treatment and Medication management  Update 06/28/21: Patient continues to express delusional thoughts, denying that they are delusional but stating maybe they are "out there."  Continues to perseverate on discharge and her husband "putting me in here."  Plan to give patient LAI of Haldol tomorrow, to which she is agreeable.  Bipolar disorder with psychotic features - Continue Haldol: Increase from 15mg   liquid to 20 mg liquid p.o.OR Haldol 10 mg IM  at bedtime (NEFM if refuses PO). Will titrate to effect with plan to transition to long acting injectable to be given 06/29/2021  -  Continue Cogentin 0.5 mg po daily at bedtime for EPS prophylaxis   Tachycardia -Continue metoprolol succinate 24-hour 1 tablet 50 mg po twice daily   Hypokalemia -potassium chloride CR tablet 40 mEQ ONCE (Completed 06/20/21)   PRN agitation -Continue Geodon 20 mg every 8 hr IM -Continue Zyprexa 10 mg every 6 hr po or IM  Nausea/vomiting -Continue Zofran tablet 8 mg every 8 hours prn   PRN, Other  -Continue Tylenol 650 mg po every 6 hrs prn pain -Continue MAALOX/MYLANTA 30 mL po every 4 hrs prn indigestion -Continue Milk of Magnesia 30 mL po daily prn constipation       Vanetta Mulders, NP 06/28/2021, 3:30 PM

## 2021-06-29 DIAGNOSIS — F312 Bipolar disorder, current episode manic severe with psychotic features: Secondary | ICD-10-CM | POA: Diagnosis not present

## 2021-06-29 LAB — CBC WITH DIFFERENTIAL/PLATELET
Abs Immature Granulocytes: 0.03 10*3/uL (ref 0.00–0.07)
Basophils Absolute: 0.1 10*3/uL (ref 0.0–0.1)
Basophils Relative: 1 %
Eosinophils Absolute: 0.1 10*3/uL (ref 0.0–0.5)
Eosinophils Relative: 1 %
HCT: 41 % (ref 36.0–46.0)
Hemoglobin: 14 g/dL (ref 12.0–15.0)
Immature Granulocytes: 0 %
Lymphocytes Relative: 27 %
Lymphs Abs: 2.7 10*3/uL (ref 0.7–4.0)
MCH: 30.5 pg (ref 26.0–34.0)
MCHC: 34.1 g/dL (ref 30.0–36.0)
MCV: 89.3 fL (ref 80.0–100.0)
Monocytes Absolute: 0.8 10*3/uL (ref 0.1–1.0)
Monocytes Relative: 8 %
Neutro Abs: 6.4 10*3/uL (ref 1.7–7.7)
Neutrophils Relative %: 63 %
Platelets: 286 10*3/uL (ref 150–400)
RBC: 4.59 MIL/uL (ref 3.87–5.11)
RDW: 13.3 % (ref 11.5–15.5)
WBC: 10.1 10*3/uL (ref 4.0–10.5)
nRBC: 0 % (ref 0.0–0.2)

## 2021-06-29 LAB — POTASSIUM: Potassium: 4.1 mmol/L (ref 3.5–5.1)

## 2021-06-29 NOTE — Progress Notes (Addendum)
Tricia Kerr  06/29/2021 10:12 AM Tricia Kerr  MRN:  [id number]  CC: " Nothing has changed from when I talked to you yesterday."    Subjective:   Tricia Kerr is a 35 year old female with a history of bipolar disorder with psychotic features.  She was admitted involuntarily, presenting to the ED via police.  Patient's husband pettioned for involuntary commitment due to her threatening behaviors and delusions.  Patient is seen and interview conducted with the patient was outside with peers and staff.  Patient was playing "corn hole" with a peer and was calm and cooperative.  Patient indicates that she continues to endorse her delusions, yet she is much less agitated and more cooperative with staff.  She denies suicidal or homicidal ideations.  Denies visual hallucinations.  Patient is agreeable to receiving Haldol decanoate today and states she will keep an appointment at [facility] for Monday, July 25.  There have been no incidents of unsafe behavior on the unit.  Patient states that she is eating and sleeping adequately.  She has some group participation.  Patient verbalizes desire to be discharged tomorrow or Friday.  I told her that this is likely unless status changes.     Principal Problem: Bipolar affective disorder, current episode manic with psychotic symptoms (HCC) Diagnosis: Principal Problem:   Bipolar affective disorder, current episode manic with psychotic symptoms (HCC)  Total Time spent with patient: 15 minutes  Past Psychiatric History:  Past history of prior episodes of psychosis.  She has been admitted numerous times since childhood.  During hospital 2016 she was discharged with Risperdal.  She was followed up with [facility] but then discontinued treatment. She was last admitted at this hospital June 2022, discharged June 23. She was stabilized on Zyprexa, 15 mg nightly.  She has not been compliant with her medications.  Past Medical History:  Past Medical History:   Diagnosis Date   Anxiety    Bipolar disorder [facility])     Past Surgical History:  Procedure Laterality Date   APPENDECTOMY     TUBAL LIGATION     tubial     Family History: History reviewed. No pertinent family history. Family Psychiatric  History: Unknown/Denies Social History:  Social History   Substance and Sexual Activity  Alcohol Use Yes     Social History   Substance and Sexual Activity  Drug Use Yes   Types: Marijuana   Comment: months    Social History   Socioeconomic History   Marital status: Divorced    Spouse name: Not on file   Number of children: Not on file   Years of education: Not on file   Highest education level: Not on file  Occupational History   Not on file  Tobacco Use   Smoking status: Every Day    Packs/day: 1.00    Types: Cigarettes   Smokeless tobacco: Never  Substance and Sexual Activity   Alcohol use: Yes   Drug use: Yes    Types: Marijuana    Comment: months   Sexual activity: Not Currently  Other Topics Concern   Not on file  Social History Narrative   Not on file   Social Determinants of Health   Financial Resource Strain: Not on file  Food Insecurity: Not on file  Transportation Needs: Not on file  Physical Activity: Not on file  Stress: Not on file  Social Connections: Not on file   Additional Social History:      Sleep: Good  Appetite:  Good  Current Medications: Current Facility-Administered Medications  Medication Dose Route Frequency Provider Last Rate Last Admin   acetaminophen (TYLENOL) tablet 650 mg  650 mg Oral Q6H PRN Clapacs, John T, MD   650 mg at 06/26/21 1116   alum & mag hydroxide-simeth (MAALOX/MYLANTA) 200-200-20 MG/5ML suspension 30 mL  30 mL Oral Q4H PRN Clapacs, John T, MD       benztropine (COGENTIN) tablet 0.5 mg  0.5 mg Oral QHS Clapacs, John T, MD   0.5 mg at 06/28/21 2117   haloperidol (HALDOL) 2 MG/ML solution 20 mg  20 mg Oral QHS Jesse Sans, MD   20 mg at 06/28/21 2118   Or    haloperidol lactate (HALDOL) injection 10 mg  10 mg Intramuscular QHS Jesse Sans, MD       haloperidol decanoate (HALDOL DECANOATE) 100 MG/ML injection 200 mg  200 mg Intramuscular Q30 days Jesse Sans, MD       magnesium hydroxide (MILK OF MAGNESIA) suspension 30 mL  30 mL Oral Daily PRN Clapacs, Jackquline Denmark, MD       metoprolol succinate (TOPROL-XL) 24 hr tablet 50 mg  50 mg Oral Daily He, Jun, MD   50 mg at 06/28/21 0758   OLANZapine (ZYPREXA) tablet 10 mg  10 mg Oral Q6H PRN Vanetta Mulders, NP       ondansetron (ZOFRAN) tablet 8 mg  8 mg Oral Q8H PRN Jesse Sans, MD   8 mg at 06/21/21 1218   ziprasidone (GEODON) injection 20 mg  20 mg Intramuscular Q8H PRN Gabriel Cirri F, NP        Lab Results:  No results found for this or any previous visit (from the past 48 hour(s)).   Blood Alcohol level:  Lab Results  Component Value Date   ETH <10 06/19/2021   ETH <10 05/31/2021    Metabolic Disorder Labs: Lab Results  Component Value Date   HGBA1C 5.5 06/04/2021   MPG 111.15 06/04/2021   No results found for: PROLACTIN Lab Results  Component Value Date   CHOL 170 06/04/2021   TRIG 68 06/04/2021   HDL 41 06/04/2021   CHOLHDL 4.1 06/04/2021   VLDL 14 06/04/2021   LDLCALC 115 (H) 06/04/2021    Physical Findings: AIMS: Facial and Oral Movements Muscles of Facial Expression: None, normal Lips and Perioral Area: None, normal Jaw: None, normal Tongue: None, normal,Extremity Movements Upper (arms, wrists, hands, fingers): None, normal Lower (legs, knees, ankles, toes): None, normal, Trunk Movements Neck, shoulders, hips: None, normal, Overall Severity Severity of abnormal movements (highest score from questions above): None, normal Incapacitation due to abnormal movements: None, normal Patient's awareness of abnormal movements (rate only patient's report): No Awareness, Dental Status Current problems with teeth and/or dentures?: No Does patient usually wear  dentures?: No  CIWA:    COWS:     Musculoskeletal: Strength & Muscle Tone: within normal limits Gait & Station: normal Patient leans: N/A  Psychiatric Specialty Exam:  Presentation  General Appearance: Appropriate for Environment; Casual  Eye Contact:Good  Speech:Clear and Coherent  Speech Volume:Normal  Handedness:Right   Mood and Affect  Mood:Irritable (improving)  Affect:Flat   Thought Process  Thought Processes:Other (comment); Coherent (delusional thoughts specific to admission)  Descriptions of Associations:Loose  Orientation:Full (Time, Place and Person)  Thought Content:Delusions  History of Schizophrenia/Schizoaffective disorder:Yes  Duration of Psychotic Symptoms:Greater than six months  Hallucinations:Hallucinations: None (denies)  Ideas of Reference:Delusions  Suicidal Thoughts:Suicidal Thoughts: No (  denies)  Homicidal Thoughts:Homicidal Thoughts: No (denies)   Sensorium  Memory:Immediate Good  Judgment:Impaired  Insight:None   Executive Functions  Concentration:Fair  Attention Span:Fair  Recall:Fair  Fund of Knowledge:Fair  Language:Good   Psychomotor Activity  Psychomotor Activity:Psychomotor Activity: Normal   Assets  Assets:Resilience; Physical Health; Social Support; Health and safety inspector; Housing   Sleep  Sleep:Number of Hours of Sleep: 8.25    Physical Exam: Physical Exam HENT:     Head: Normocephalic.     Nose: No congestion or rhinorrhea.  Eyes:     General:        Right eye: No discharge.        Left eye: No discharge.  Pulmonary:     Effort: Pulmonary effort is normal.  Musculoskeletal:        General: Normal range of motion.     Cervical back: Normal range of motion.  Neurological:     Mental Status: She is alert and oriented to person, place, and time.  Psychiatric:        Attention and Perception: Attention normal.        Mood and Affect: Mood normal.        Speech: Speech normal.         Behavior: Behavior normal.        Thought Content: Thought content is delusional.     Comments: Poor insight into her mental illness. Still has some delusional thinking.   Review of Systems  Psychiatric/Behavioral:  Negative for depression, hallucinations, memory loss, substance abuse and suicidal ideas. The patient is not nervous/anxious and does not have insomnia.        Pt still presents with delusional thinking  All other systems reviewed and are negative. Blood pressure 103/62, pulse 72, temperature (!) 97.5 F (36.4 C), temperature source Oral, resp. rate 17, height 4\' 9"  (1.448 m), weight 66.2 kg, last menstrual period 06/02/2021, SpO2 99 %. Body mass index is 31.59 kg/m.   Treatment Plan Summary: Daily contact with patient to assess and evaluate symptoms and progress in treatment and Medication management  Update 06/29/21: Patient is much calmer and cooperative than she was at admission. Less calling her husband and insisting he pick her up. Patient amenable to receiving haldol injection today. Likely discharge by 07/01/2021. Repeat CBC & potassium level.  Bipolar disorder with psychotic features - Continue Haldol: 20 mg liquid p.o.OR Haldol 10 mg IM at bedtime (NEFM if refuses PO).  -LAI, Haldol Deconate 200 mg IM today (#1). Give on outpatient basis every 28-30 days  -Continue Cogentin 0.5 mg po daily at bedtime for EPS prophylaxis   Tachycardia -Continue metoprolol succinate 24-hour 1 tablet 50 mg po daily   Hypokalemia -potassium chloride CR tablet 40 mEQ ONCE (Completed 06/20/21) -Ordered Repeat Potassium level 06/29/21   PRN agitation -Continue Geodon 20 mg every 8 hr IM -Continue Zyprexa 10 mg every 6 hr po or IM  Nausea/vomiting -Continue Zofran tablet 8 mg every 8 hours prn   PRN, Other  -Continue Tylenol 650 mg po every 6 hrs prn pain -Continue MAALOX/MYLANTA 30 mL po every 4 hrs prn indigestion -Continue Milk of Magnesia 30 mL po daily prn constipation        07/01/21, NP 06/29/2021, 10:12 AM

## 2021-06-29 NOTE — Progress Notes (Signed)
D: Pt alert and oriented. Pt rates depression 0/10, hopelessness 0/10, and anxiety 2/10. Pt goal: "Release." Pt reports energy level as normal and concentration as being good. Pt reports sleep last night as being good. Pt did not receive medications for sleep. Pt denies experiencing pain. Pt denies experiencing any SI/HI, or AVH at this time.   Pt was not given BP medication this morning d/t having low BP earlier and only after being given 2 gatorades did pt's BP become WNL. MD notified and med not given. Pt did receive long acting Haldol injection this afternoon. Pt has been calm and cooperative.  A: Scheduled medications administered to pt, per MD orders. Support and encouragement provided. Frequent verbal contact made. Routine safety checks conducted q15 minutes.   R: No adverse drug reactions noted. Pt verbally contracts for safety at this time. Pt complaint with medications and treatment plan. Pt interacts well with others on the unit. Pt remains safe at this time. Will continue to monitor.

## 2021-06-29 NOTE — BHH Group Notes (Signed)
  LCSW Group Therapy Note     06/29/2021 2:41 PM     Type of Therapy/Topic:  Group Therapy:  Emotion Regulation     Participation Level:  Did Not Attend     Description of Group:   The purpose of this group is to assist patients in learning to regulate negative emotions and experience positive emotions. Patients will be guided to discuss ways in which they have been vulnerable to their negative emotions. These vulnerabilities will be juxtaposed with experiences of positive emotions or situations, and patients will be challenged to use positive emotions to combat negative ones. Special emphasis will be placed on coping with negative emotions in conflict situations, and patients will process healthy conflict resolution skills.     Therapeutic Goals:  1.    Patient will identify two positive emotions or experiences to reflect on in order to balance out negative emotions  2.    Patient will label two or more emotions that they find the most difficult to experience  3.    Patient will demonstrate positive conflict resolution skills through discussion and/or role plays     Summary of Patient Progress: X     Therapeutic Modalities:   Cognitive Behavioral Therapy  Feelings Identification  Dialectical Behavioral Therapy   Tricia Kerr, MSW, LCSW-A  06/29/2021 2:41 PM

## 2021-06-29 NOTE — Progress Notes (Signed)
Recreation Therapy Notes   Date: 06/29/2021  Time: 10:30 am   Location: Craft room  Behavioral response: Appropriate   Intervention Topic: Relaxation   Discussion/Intervention:  Group content today was focused on relaxation. The group defined relaxation and identified healthy ways to relax. Individuals expressed how much time they spend relaxing. Patients expressed how much their life would be if they did not make time for themselves to relax. The group stated ways they could improve their relaxation techniques in the future.  Individuals participated in the intervention "Time to Relax" where they had a chance to experience different relaxation techniques.  Clinical Observations/Feedback: Patient came to group and defined relaxation as being alone. She stated that she relaxes by going on vacation. Participant expressed that other people stop her from relaxing. Patient explained that relaxation is important for her, so she does not lose her temper. Individual was social with peers and staff while participating in the intervention.  Tricia Kerr LRT/CTRS         Tricia Kerr 06/29/2021 12:43 PM

## 2021-06-30 ENCOUNTER — Other Ambulatory Visit: Payer: Self-pay

## 2021-06-30 MED ORDER — HALOPERIDOL 20 MG PO TABS: 20.0000 mg | ORAL_TABLET | Freq: Every day | ORAL | 0 refills | Status: DC

## 2021-06-30 MED ORDER — HALOPERIDOL DECANOATE 100 MG/ML IM SOLN: 200.0000 mg | INTRAMUSCULAR | 3 refills | Status: DC

## 2021-06-30 MED ORDER — HALOPERIDOL 5 MG PO TABS
20.0000 mg | ORAL_TABLET | Freq: Every day | ORAL | 0 refills | Status: DC
  Filled 2021-06-30: qty 56, 14d supply, fill #0

## 2021-06-30 MED ORDER — METOPROLOL SUCCINATE ER 50 MG PO TB24
50.0000 mg | ORAL_TABLET | Freq: Every day | ORAL | 0 refills | Status: DC
  Filled 2021-06-30: qty 7, 7d supply, fill #0

## 2021-06-30 MED ORDER — METOPROLOL SUCCINATE ER 50 MG PO TB24: 50.0000 mg | ORAL_TABLET | Freq: Every day | ORAL | 1 refills | Status: DC

## 2021-06-30 MED ORDER — HALOPERIDOL 5 MG PO TABS: 20.0000 mg | ORAL_TABLET | Freq: Every day | ORAL | Status: DC

## 2021-06-30 MED ORDER — BENZTROPINE MESYLATE 0.5 MG PO TABS: 0.5000 mg | ORAL_TABLET | Freq: Every day | ORAL | 2 refills | Status: DC

## 2021-06-30 MED ORDER — BENZTROPINE MESYLATE 0.5 MG PO TABS
0.5000 mg | ORAL_TABLET | Freq: Every day | ORAL | 0 refills | Status: DC
  Filled 2021-06-30: qty 7, 7d supply, fill #0

## 2021-06-30 NOTE — Plan of Care (Signed)
Pt out and about in the milieu a few times last night. Pt given tylenol for a headache with good effect. Meds taken whole with water. Pt denies SI/HI/AVH. Q15 minutes safety check maintained.  Problem: Education: Goal: Knowledge of [facility] Education information/materials will improve Outcome: Progressing Goal: Emotional status will improve Outcome: Progressing Goal: Mental status will improve Outcome: Progressing Goal: Verbalization of understanding the information provided will improve Outcome: Progressing   Problem: Activity: Goal: Interest or engagement in activities will improve Outcome: Progressing Goal: Sleeping patterns will improve Outcome: Progressing   Problem: Coping: Goal: Ability to verbalize frustrations and anger appropriately will improve Outcome: Progressing Goal: Ability to demonstrate self-control will improve Outcome: Progressing   Problem: Health Behavior/Discharge Planning: Goal: Identification of resources available to assist in meeting health care needs will improve Outcome: Progressing Goal: Compliance with treatment plan for underlying cause of condition will improve Outcome: Progressing   Problem: Physical Regulation: Goal: Ability to maintain clinical measurements within normal limits will improve Outcome: Progressing   Problem: Safety: Goal: Periods of time without injury will increase Outcome: Progressing   Problem: Activity: Goal: Will verbalize the importance of balancing activity with adequate rest periods Outcome: Progressing   Problem: Education: Goal: Will be free of psychotic symptoms Outcome: Progressing Goal: Knowledge of the prescribed therapeutic regimen will improve Outcome: Progressing   Problem: Coping: Goal: Coping ability will improve Outcome: Progressing Goal: Will verbalize feelings Outcome: Progressing   Problem: Health Behavior/Discharge Planning: Goal: Compliance with prescribed medication regimen  will improve Outcome: Progressing   Problem: Nutritional: Goal: Ability to achieve adequate nutritional intake will improve Outcome: Progressing   Problem: Role Relationship: Goal: Ability to communicate needs accurately will improve Outcome: Progressing Goal: Ability to interact with others will improve Outcome: Progressing   Problem: Safety: Goal: Ability to redirect hostility and anger into socially appropriate behaviors will improve Outcome: Progressing Goal: Ability to remain free from injury will improve Outcome: Progressing   Problem: Self-Care: Goal: Ability to participate in self-care as condition permits will improve Outcome: Progressing   Problem: Self-Concept: Goal: Will verbalize positive feelings about self Outcome: Progressing

## 2021-06-30 NOTE — Progress Notes (Signed)
Recreation Therapy Notes  Date: 06/30/2021  Time: 9:30 am  Location: Courtyard   Behavioral response: Appropriate   Intervention Topic: Animal Assisted Therapy   Discussion/Intervention:  Animal Assisted Therapy took place today during group.  Animal Assisted Therapy is the planned inclusion of an animal in a patient's treatment plan. The patients were able to engage in therapy with an animal during group. Participants were educated on what a service dog is and the different between a support dog and a service dog. Patient were informed on how animal needs are similar to people needs. Individuals were enlightened on the process to get a service animal or support animal. Patients got the opportunity to pet the animal and were offered emotional support from the animal and staff.  Clinical Observations/Feedback:  Patient came to group and was on topic and was focused on what peers and staff had to say. Participant shared their experiences and history with animals. Individual was social with peers, staff and animal while participating in group.  Tricia Kerr LRT/CTRS         Tricia Kerr 06/30/2021 11:58 AM

## 2021-06-30 NOTE — Plan of Care (Signed)
  Problem: Coping Skills Goal: STG - Patient will identify 3 positive coping skills strategies to use post d/c within 5 recreation therapy group sessions Description: STG - Patient will identify 3 positive coping skills strategies to use post d/c within 5 recreation therapy group sessions Outcome: Completed/Met

## 2021-06-30 NOTE — Progress Notes (Signed)
Pt given her scheduled am metoprolol for elevated BP. See VS.  Pt given snacks and juice, advised to rest a bit. Am shift will follow up.

## 2021-06-30 NOTE — Progress Notes (Signed)
Pt denies SI, HI and AVH. Pt was educated on dc plan and verbalizes understanding. Pt was given AVS, transition packet and belongings. Pt did not want to wait for her seven day supply of meds but she says that she will come back this afternoon or tomorrow morning. Torrie Mayers RN

## 2021-06-30 NOTE — Progress Notes (Signed)
  [facility] Adult Case Management Discharge Plan :  Will you be returning to the same living situation after discharge:  Yes,  Patient to return to place of residence.  At discharge, do you have transportation home?: Yes,  Family to assist with transportation Do you have the ability to pay for your medications: No.  Release of information consent forms completed and in the chart;  Patient's signature needed at discharge.  Patient to Follow up at:  Follow-up Information     [facility] Follow up.   Why: Please meet with Lorella Nimrod, peer support specialist, at [facility] on Monday 04 July 2021 @ 0730. Contact information: [address] [place] [address] [phone]                 Next level of care provider has access to [facility] Link:no  Safety Planning and Suicide Prevention discussed: Yes,  SPE completed with patient, patient declined to provide consent for CSW to reach collateral contact.   Have you used any form of tobacco in the last 30 days? (Cigarettes, Smokeless Tobacco, Cigars, and/or Pipes): Patient Refused Screening  Has patient been referred to the Quitline?: Patient refused referral  Patient has been referred for addiction treatment: N/A  Corky Crafts, LCSWA 06/30/2021, 9:36 AM

## 2021-06-30 NOTE — BHH Suicide Risk Assessment (Signed)
[facility] Discharge Suicide Risk Assessment   Principal Problem: Bipolar affective disorder, current episode manic with psychotic symptoms [facility]) Discharge Diagnoses: Principal Problem:   Bipolar affective disorder, current episode manic with psychotic symptoms (HCC)   Total Time spent with patient: 35 minutes- 25 minutes face-to-face contact with patient, 10 minutes documentation, coordination of care, scripts   Musculoskeletal: Strength & Muscle Tone: within normal limits Gait & Station: normal Patient leans: N/A  Psychiatric Specialty Exam  Presentation  General Appearance: Appropriate for Environment; Casual  Eye Contact:Good  Speech:Clear and Coherent; Normal Rate  Speech Volume:Normal  Handedness:Right   Mood and Affect  Mood:Euthymic  Duration of Depression Symptoms: No data recorded Affect:Congruent   Thought Process  Thought Processes:Goal Directed  Descriptions of Associations:Intact  Orientation:Full (Time, Place and Person)  Thought Content:Logical  History of Schizophrenia/Schizoaffective disorder:Yes  Duration of Psychotic Symptoms:Greater than six months  Hallucinations:Hallucinations: None  Ideas of Reference:None  Suicidal Thoughts:Suicidal Thoughts: No  Homicidal Thoughts:Homicidal Thoughts: No   Sensorium  Memory:Immediate Good; Recent Good; Remote Good  Judgment:Intact  Insight:Present   Executive Functions  Concentration:Fair  Attention Span:Fair  Recall:Fair  Fund of Knowledge:Fair  Language:Fair   Psychomotor Activity  Psychomotor Activity:Psychomotor Activity: Normal   Assets  Assets:Communication Skills; Desire for Improvement; Housing; Intimacy; Physical Health; Social Support; Transportation   Sleep  Sleep:Sleep: Good Number of Hours of Sleep: 8   Physical Exam: Physical Exam ROS Blood pressure (!) 147/115, pulse 96, temperature 98.2 F (36.8 C), temperature source Oral, resp. rate 17, height 4\' 9"   (1.448 m), weight 66.2 kg, last menstrual period 06/02/2021, SpO2 100 %. Body mass index is 31.59 kg/m.  Mental Status Per Nursing Assessment::   On Admission:  NA  Demographic Factors:  Caucasian  Loss Factors: NA  Historical Factors: Impulsivity  Risk Reduction Factors:   Responsible for children under 61 years of age, Sense of responsibility to family, Living with another person, especially a relative, Positive social support, Positive therapeutic relationship, and Positive coping skills or problem solving skills  Continued Clinical Symptoms:  Depression:   Recent sense of peace/wellbeing Previous Psychiatric Diagnoses and Treatments Medical Diagnoses and Treatments/Surgeries  Cognitive Features That Contribute To Risk:  None    Suicide Risk:  Minimal: No identifiable suicidal ideation.  Patients presenting with no risk factors but with morbid ruminations; may be classified as minimal risk based on the severity of the depressive symptoms   Follow-up Information     [facility] Follow up.   Why: Please meet with 15, peer support specialist, at [facility] on Monday 04 July 2021 @ 0730. Contact information: [address] [place] [phone]                 Plan Of Care/Follow-up recommendations:  Activity:  as tolerated Diet:  low sodium heart healthy diet  [phone], MD 06/30/2021, 9:42 AM

## 2021-06-30 NOTE — Discharge Summary (Signed)
m Physician Discharge Summary Note  Patient:  Tricia Kerr is an 35 y.o., female MRN:  [id number] DOB:  [birth year] Patient phone:  [phone] (home)  Patient address:   [address] [place] [place] [address],  Total Time spent with patient: 30 minutes  Date of Admission:  06/20/2021 Date of Discharge: 06/30/2021  Reason for Admission:   Tricia Kerr is a 35 year old female with a history of bipolar disorder with psychotic features.  She was admitted involuntarily, presenting to the ED via police.  Patient's husband pettioned for involuntary commitment due to her threatening behaviors and delusions.  Patient was brought to the ED from [facility]. Patient was discharged from this hospital on 06/02/2021 after being admitted for similar presentation and has been noncompliant with medications since discharge.  Principal Problem: Bipolar affective disorder, current episode manic with psychotic symptoms [facility]) Discharge Diagnoses: Principal Problem:   Bipolar affective disorder, current episode manic with psychotic symptoms (HCC)   Past Psychiatric History: Past history of prior episodes of psychosis.  She has been admitted numerous times since childhood.  During hospital 2016 she was discharged with Risperdal.  She was followed up with [facility] but then discontinued treatment. She was last admitted at this hospital June 2022, stabilized on Zyprexa, 15 mg nightly.  She has not been compliant with her medications.  Past Medical History:  Past Medical History:  Diagnosis Date   Anxiety    Bipolar disorder [facility])     Past Surgical History:  Procedure Laterality Date   APPENDECTOMY     TUBAL LIGATION     tubial     Family History: History reviewed. No pertinent family history. Family Psychiatric  History: Unknown Social History:  Social History   Substance and Sexual Activity  Alcohol Use Yes     Social History   Substance and Sexual Activity  Drug Use Yes   Types: Marijuana    Comment: months    Social History   Socioeconomic History   Marital status: Divorced    Spouse name: Not on file   Number of children: Not on file   Years of education: Not on file   Highest education level: Not on file  Occupational History   Not on file  Tobacco Use   Smoking status: Every Day    Packs/day: 1.00    Types: Cigarettes   Smokeless tobacco: Never  Substance and Sexual Activity   Alcohol use: Yes   Drug use: Yes    Types: Marijuana    Comment: months   Sexual activity: Not Currently  Other Topics Concern   Not on file  Social History Narrative   Not on file   Social Determinants of Health   Financial Resource Strain: Not on file  Food Insecurity: Not on file  Transportation Needs: Not on file  Physical Activity: Not on file  Stress: Not on file  Social Connections: Not on file    Hospital Course:  After the above admission evaluation, patient was admitted to the BMU for medication management and treatment for her symptoms of mania and delusional thinking. There were no instances of behavior that required restraints or immediate intervention. Patient remained safe on the unit. There were no instances of self-harming behavior. Patient was very irritable and perseverated on discharge at first. However, she remained cooperative, participated in group sessions and interacted with staff and patients appropriately. Patient was  given Zyprexa with no improvement, and was switched to Haldol, which she tolerated. Haldol was titrated to  20 mg orally. NEFM Haldol was prescribed if patient refused oral, but patient was compliant with liquid oral Haldol. Patient received Haldol Deconate 200 mg IM on 06/29/2021, with instructions that she receive this monthly. Prescriptions were sent electronically to [facility] in [place] at the request of patient. 7 day supply from [facility] pharmacy was given. Over the course of her hospitalization, patient's symptom's improved to the point where it was  determined that a safe discharge disposition to home was accomplished. Patient denies thoughts of self-harm and suicidal ideation. She denies suicidal ideation. She does not show signs of mania at discharge. She exhibits some delusional thoughts (is "not sure if her children came from her eggs"), but is calm and cooperative. Oriented x 3.  Patient expresses readiness for discharge and future-oriented thinking. Patient encouraged to keep all psychiatric appointments and continue with follow-up.  Repeat labs were normal: Potassium level WNL @ 4.1; CBC WNL, WBC 10.1.    Physical Findings: AIMS: Facial and Oral Movements Muscles of Facial Expression: None, normal Lips and Perioral Area: None, normal Jaw: None, normal Tongue: None, normal,Extremity Movements Upper (arms, wrists, hands, fingers): None, normal Lower (legs, knees, ankles, toes): None, normal, Trunk Movements Neck, shoulders, hips: None, normal, Overall Severity Severity of abnormal movements (highest score from questions above): None, normal Incapacitation due to abnormal movements: None, normal Patient's awareness of abnormal movements (rate only patient's report): No Awareness, Dental Status Current problems with teeth and/or dentures?: No Does patient usually wear dentures?: No  CIWA:    COWS:     Musculoskeletal: Strength & Muscle Tone: within normal limits Gait & Station: normal Patient leans: N/A   Psychiatric Specialty Exam: SEE PHYSICIAN'S DISCHARGE SRA  Presentation  General Appearance: Appropriate for Environment; Casual  Eye Contact:Good  Speech:Clear and Coherent; Normal Rate  Speech Volume:Normal  Handedness:Right   Mood and Affect  Mood:Euthymic  Affect:Congruent   Thought Process  Thought Processes:Goal Directed  Descriptions of Associations:Intact  Orientation:Full (Time, Place and Person)  Thought Content:Logical  History of Schizophrenia/Schizoaffective disorder:Yes  Duration of  Psychotic Symptoms:Greater than six months  Hallucinations:Hallucinations: None  Ideas of Reference:None  Suicidal Thoughts:Suicidal Thoughts: No  Homicidal Thoughts:Homicidal Thoughts: No   Sensorium  Memory:Immediate Good; Recent Good; Remote Good  Judgment:Intact  Insight:Present   Executive Functions  Concentration:Fair  Attention Span:Fair  Recall:Fair  Fund of Knowledge:Fair  Language:Fair   Psychomotor Activity  Psychomotor Activity:Psychomotor Activity: Normal   Assets  Assets:Communication Skills; Desire for Improvement; Housing; Intimacy; Physical Health; Social Support; Transportation   Sleep  Sleep:Sleep: Good Number of Hours of Sleep: 8    Physical Exam: Physical Exam Vitals (Pt in no acuute distress) and nursing note reviewed.  HENT:     Head: Normocephalic.     Nose: No congestion or rhinorrhea.  Eyes:     General:        Right eye: No discharge.        Left eye: No discharge.  Pulmonary:     Effort: Pulmonary effort is normal.  Musculoskeletal:        General: Normal range of motion.     Cervical back: Normal range of motion.  Skin:    General: Skin is warm and dry.  Neurological:     Mental Status: She is alert and oriented to person, place, and time.  Psychiatric:        Mood and Affect: Mood normal.        Behavior: Behavior normal.  Thought Content: Thought content is delusional (stable/safe for discharge).        Cognition and Memory: Cognition normal.        Judgment: Judgment normal.     Comments: Has demonstrated safety and normal judgement on the unit    Review of Systems  Constitutional: Negative.   HENT: Negative.    Eyes: Negative.   Respiratory: Negative.    Cardiovascular: Negative.   Gastrointestinal: Negative.   Genitourinary: Negative.   Musculoskeletal: Negative.   Skin: Negative.   Neurological: Negative.   Endo/Heme/Allergies: Negative.   Psychiatric/Behavioral:         Delusional thoughts  related to origin of her children. No unsafe behavior on the unit; demonstrates safety on the unit. Calm and cooperative. Oriented x 3  Blood pressure (!) 147/115, pulse 96, temperature 98.2 F (36.8 C), temperature source Oral, resp. rate 17, height 4\' 9"  (1.448 m), weight 66.2 kg, last menstrual period 06/02/2021, SpO2 100 %. Body mass index is 31.59 kg/m.   Social History   Tobacco Use  Smoking Status Every Day   Packs/day: 1.00   Types: Cigarettes  Smokeless Tobacco Never   Tobacco Cessation:  Prescription not provided because: Patient has allergy to  patches.    Blood Alcohol level:  Lab Results  Component Value Date   ETH <10 06/19/2021   ETH <10 05/31/2021    Metabolic Disorder Labs:  Lab Results  Component Value Date   HGBA1C 5.5 06/04/2021   MPG 111.15 06/04/2021   No results found for: PROLACTIN Lab Results  Component Value Date   CHOL 170 06/04/2021   TRIG 68 06/04/2021   HDL 41 06/04/2021   CHOLHDL 4.1 06/04/2021   VLDL 14 06/04/2021   LDLCALC 115 (H) 06/04/2021    See Psychiatric Specialty Exam and Suicide Risk Assessment completed by Attending Physician prior to discharge.  Discharge destination:  Home  Is patient on multiple antipsychotic therapies at discharge:  No   Has Patient had three or more failed trials of antipsychotic monotherapy by history:  No  Recommended Plan for Multiple Antipsychotic Therapies: NA  Discharge Instructions     Discharge instructions   Complete by: As directed    Discharge Recommendations:  You are being discharged home with your family. Please take your medications as ordered. We will give you a 7 day supply, with a prescription. You need to take the oral haldol for two weeks, then the monthly injection. You received the first injection June 29, 2021. Next injection will be July 27, 2021. You can get this administered at [facility]. You have an appointment at [facility] on Monday, July 25,2022. Please keep all appointments and  follow up with medical provider for any other health care needs.  We recommend that you participate in family therapy to target any conflict within your family, to improve communication skills and conflict resolution skills.  Monitor any thoughts of suicide. Abstain from all illicit substances and alcohol.  If your symptoms worsen or do not continue to improve or if you become actively suicidal or homicidal then it is recommended that you return to the closest hospital emergency room or call 911 for further evaluation and treatment. National Suicide Prevention Lifeline 1800-SUICIDE or 765-047-5302. The patient has been educated on the possible side effects to medications and she/her family is to contact a medical professional and inform outpatient provider of any new side effects of medication.  Family was educated about removing/locking any firearms, medications or dangerous products from the  home.   Increase activity slowly   Complete by: As directed       Allergies as of 06/30/2021       Reactions   Nicotine Anaphylaxis   Nicotine patches         Medication List     STOP taking these medications    metoprolol tartrate 25 MG tablet Commonly known as: LOPRESSOR   OLANZapine 15 MG tablet Commonly known as: ZYPREXA       TAKE these medications      Indication  benztropine 0.5 MG tablet Commonly known as: COGENTIN Take 1 tablet (0.5 mg total) by mouth at bedtime.  Indication: Extrapyramidal Reaction caused by Medications   haloperidol 5 MG tablet Commonly known as: HALDOL Take 4 tablets (20 mg total) by mouth at bedtime.  Indication: Manic Phase of Manic-Depression   haloperidol decanoate 100 MG/ML injection Commonly known as: HALDOL DECANOATE Inject 2 mLs (200 mg total) into the muscle every 30 (thirty) days. For IM administration ONLY! First one given 06/29/2021. Next due 07/29/2021 Start taking on: July 29, 2021  Indication: Manic Phase of Manic-Depression    metoprolol succinate 50 MG 24 hr tablet Commonly known as: TOPROL-XL Take 1 tablet (50 mg total) by mouth daily. Start taking on: July 01, 2021  Indication: High Blood Pressure Disorder        Follow-up Information     [facility] Follow up.   Why: Please meet with Lorella Nimrod, peer support specialist, at [facility] on Monday 04 July 2021 @ 0730. Contact information: [address] [place] [address] [phone]                 Follow-up recommendations:  She remained cooperative, participated in group sessions and interacted with staff and patients appropriately.  Comments:  Take all of your medications as prescribed.  Report any side effects to your outpatient provider promptly.  Refrain from alcohol and illegal drug use while taking medications.  In the case of emergency call 911 or go to the nearest emergency department for evaluation/treatment.   Signed: Vanetta Mulders, NP 06/30/2021, 9:56 AM

## 2021-06-30 NOTE — Plan of Care (Signed)
Pt denies depression, anxiety, SI, HI and AVH. Pt was educated on care plan and verbalizes understanding. Torrie Mayers RN  Problem: Education: Goal: Knowledge of [place] [facility] Education information/materials will improve Outcome: Progressing Goal: Emotional status will improve Outcome: Progressing Goal: Mental status will improve Outcome: Progressing Goal: Verbalization of understanding the information provided will improve Outcome: Progressing   Problem: Activity: Goal: Interest or engagement in activities will improve Outcome: Progressing Goal: Sleeping patterns will improve Outcome: Progressing   Problem: Coping: Goal: Ability to verbalize frustrations and anger appropriately will improve Outcome: Progressing Goal: Ability to demonstrate self-control will improve Outcome: Progressing   Problem: Health Behavior/Discharge Planning: Goal: Identification of resources available to assist in meeting health care needs will improve Outcome: Progressing Goal: Compliance with treatment plan for underlying cause of condition will improve Outcome: Progressing   Problem: Physical Regulation: Goal: Ability to maintain clinical measurements within normal limits will improve Outcome: Progressing   Problem: Safety: Goal: Periods of time without injury will increase Outcome: Progressing   Problem: Activity: Goal: Will verbalize the importance of balancing activity with adequate rest periods Outcome: Progressing   Problem: Education: Goal: Will be free of psychotic symptoms Outcome: Progressing Goal: Knowledge of the prescribed therapeutic regimen will improve Outcome: Progressing   Problem: Coping: Goal: Coping ability will improve Outcome: Progressing Goal: Will verbalize feelings Outcome: Progressing   Problem: Health Behavior/Discharge Planning: Goal: Compliance with prescribed medication regimen will improve Outcome: Progressing   Problem: Nutritional: Goal:  Ability to achieve adequate nutritional intake will improve Outcome: Progressing   Problem: Role Relationship: Goal: Ability to communicate needs accurately will improve Outcome: Progressing Goal: Ability to interact with others will improve Outcome: Progressing   Problem: Safety: Goal: Ability to redirect hostility and anger into socially appropriate behaviors will improve Outcome: Progressing Goal: Ability to remain free from injury will improve Outcome: Progressing   Problem: Self-Care: Goal: Ability to participate in self-care as condition permits will improve Outcome: Progressing   Problem: Self-Concept: Goal: Will verbalize positive feelings about self Outcome: Progressing

## 2021-06-30 NOTE — Progress Notes (Signed)
Recreation Therapy Notes  INPATIENT RECREATION TR PLAN  Patient Details Name: Tricia Kerr MRN: [id number] DOB: 1986/04/17 Today's Date: 06/30/2021  Rec Therapy Plan Is patient appropriate for Therapeutic Recreation?: Yes Treatment times per week: at least 3 Estimated Length of Stay: 5-7 days TR Treatment/Interventions: Group participation (Comment)  Discharge Criteria Pt will be discharged from therapy if:: Discharged Treatment plan/goals/alternatives discussed and agreed upon by:: Patient/family  Discharge Summary Short term goals set: Patient will identify 3 positive coping skills strategies to use post d/c within 5 recreation therapy group sessions Short term goals met: Complete Progress toward goals comments: Groups attended Which groups?: AAA/T, Coping skills, Stress management, Wellness, Social skills, Other (Comment) (Relaxation) Reason goals not met: N/A Therapeutic equipment acquired: N/A Reason patient discharged from therapy: Discharge from hospital Pt/family agrees with progress & goals achieved: Yes Date patient discharged from therapy: 06/30/21   Celeste Tavenner 06/30/2021, 12:03 PM

## 2022-01-24 ENCOUNTER — Ambulatory Visit: Payer: Self-pay

## 2022-01-31 ENCOUNTER — Encounter: Payer: Self-pay | Admitting: Family Medicine

## 2022-01-31 ENCOUNTER — Ambulatory Visit (LOCAL_COMMUNITY_HEALTH_CENTER): Payer: Self-pay | Admitting: Family Medicine

## 2022-01-31 ENCOUNTER — Other Ambulatory Visit: Payer: Self-pay

## 2022-01-31 VITALS — BP 122/85 | Ht 59.0 in | Wt 174.2 lb

## 2022-01-31 DIAGNOSIS — Z3009 Encounter for other general counseling and advice on contraception: Secondary | ICD-10-CM

## 2022-01-31 DIAGNOSIS — Z6835 Body mass index (BMI) 35.0-35.9, adult: Secondary | ICD-10-CM

## 2022-01-31 NOTE — Progress Notes (Signed)
Pt here for PE and Pap.  Pt declined condoms.  Berdie Ogren, RN

## 2022-01-31 NOTE — Progress Notes (Signed)
[facility] [place] Number: [phone]  Family Planning Visit- Repeat Yearly Visit  Subjective:  Tricia Kerr is a 36 y.o. G3P0  being seen today for an annual wellness visit and to discuss contraception options.   The patient is currently using Abstinence for pregnancy prevention. Patient does not want a pregnancy in the next year. Patient has the following medical problems: has Tobacco use disorder; Cannabis use disorder, severe, dependence ([place]); Intractable nausea and vomiting; Bipolar I disorder, single manic episode, severe, with psychosis ([place]); Bipolar affective disorder, current episode mixed, without psychotic features ([place]); and Bipolar affective disorder, current episode manic with psychotic symptoms ([place]) on their problem list.  Chief Complaint  Patient presents with   Annual Exam    PE and Pap    Patient reports she is here today for well woman PE and pap.  States its been at least 8 years since she's had a pap and a PE.  States her last pap was abnormal.  States that she's not been sexually active for > 1 year. If she decides to become sexually active she and partner will use condoms.  She is currently under mental health treatment with [facility], receiving monthly Haldol injections.  States her provider decreasing her dose of Haldol due to her excessive weight gain.  Patient denies other concerns at this time.  See flowsheet for other program required questions.   Body mass index is 35.18 kg/m. - Patient is eligible for diabetes screening based on BMI and age [id number]?  not applicable [id number] ordered? no  Patient reports 0 of partners in last year. Desires STI screening?  declines  Has patient been screened once for HCV in the past?  No-client is unsure if has has testing and declines testing today.  No results found for: HCVAB  Does the patient have current of drug use, have a partner with drug use, and/or  has been incarcerated since last result? No  If yes-- Screen for HCV through [facility]   Does the patient meet criteria for HBV testing? No  Criteria:  -Household, sexual or needle sharing contact with HBV -History of drug use -HIV positive -Those with known Hep C   Health Maintenance Due  Topic Date Due   COVID-19 Vaccine (1) Never done   Hepatitis C Screening  Never done   TETANUS/TDAP  Never done   PAP SMEAR-Modifier  Never done   INFLUENZA VACCINE  Never done    Review of Systems  Constitutional:        Weight gain on Haldol  All other systems reviewed and are negative.  The following portions of the patient's history were reviewed and updated as appropriate: allergies, current medications, past family history, past medical history, past social history, past surgical history and problem list. Problem list updated.  Objective:   Vitals:   01/31/22 1038  BP: 122/85  Weight: 174 lb 3.2 oz (79 kg)  Height: 4\' 11"  (1.499 m)    Physical Exam Vitals and nursing note reviewed.  Constitutional:      Appearance: Normal appearance.  HENT:     Head: Normocephalic.  Pulmonary:     Effort: Pulmonary effort is normal.  Genitourinary:    Exam position: Lithotomy position.     Pubic Area: No rash or pubic lice.      Labia:        Right: No rash, tenderness or lesion.  Left: No rash or tenderness.      Vagina: Normal.     Cervix: Normal.     Uterus: Normal.      Adnexa: Right adnexa normal and left adnexa normal.     Rectum: Normal.  Musculoskeletal:        General: Normal range of motion.  Lymphadenopathy:     Lower Body: No right inguinal adenopathy. No left inguinal adenopathy.  Skin:    General: Skin is warm and dry.  Neurological:     Mental Status: She is alert. Mental status is at baseline.      Assessment and Plan:  Tricia Kerr is a 36 y.o. female G3P0 presenting to the [facility] Department for an yearly wellness and  contraception visit  Contraception counseling: Reviewed all forms of birth control options in the tiered based approach. available including abstinence; over the counter/barrier methods; hormonal contraceptive medication including pill, patch, ring, injection,contraceptive implant, ECP; hormonal and nonhormonal IUDs; permanent sterilization options including vasectomy and the various tubal sterilization modalities. Risks, benefits, and typical effectiveness rates were reviewed.  Questions were answered.  Written information was also given to the patient to review.  Patient desires no method today- accepted condoms, this was prescribed for patient. She will follow up in 1 year for surveillance.    was told to call with any further questions, or with any concerns about this method of contraception.  Emphasized use of condoms 100% of the time for STI prevention.  ECP's not indicated with client's history.   1. Family planning Client states that she's abstinent but will use condoms if decides to be sexually active. - IGP, Aptima HPV  2. BMI 35.0-35.9,adult Client states that she gained most of her wight with the Haldol injection-plans to lose the weight when med is dc'd. Co. To increase daily exercise and healthier foods options.    No follow-ups on file.  No future appointments.  Hassell Done, FNP

## 2022-02-04 LAB — IGP, APTIMA HPV
HPV Aptima: NEGATIVE
PAP Smear Comment: 0

## 2022-05-29 NOTE — Progress Notes (Signed)
Late Entry Pap Triage  NIL, HPV negative  Next pap in 5 year

## 2022-05-29 NOTE — Progress Notes (Signed)
PAP Normal and HPV Negative.  Repeat PAP in 5 years (01-2027) per Kimberly Newton, MD.  PAP card mailed today.  Routed 05-29-2022.  Brinna Divelbiss, RN  

## 2022-12-18 ENCOUNTER — Other Ambulatory Visit: Payer: Self-pay

## 2022-12-18 DIAGNOSIS — R748 Abnormal levels of other serum enzymes: Secondary | ICD-10-CM | POA: Insufficient documentation

## 2022-12-18 DIAGNOSIS — F17210 Nicotine dependence, cigarettes, uncomplicated: Secondary | ICD-10-CM | POA: Insufficient documentation

## 2022-12-18 DIAGNOSIS — K8012 Calculus of gallbladder with acute and chronic cholecystitis without obstruction: Principal | ICD-10-CM | POA: Insufficient documentation

## 2022-12-18 DIAGNOSIS — D75839 Thrombocytosis, unspecified: Secondary | ICD-10-CM | POA: Insufficient documentation

## 2022-12-18 DIAGNOSIS — K851 Biliary acute pancreatitis without necrosis or infection: Secondary | ICD-10-CM | POA: Insufficient documentation

## 2022-12-18 DIAGNOSIS — K259 Gastric ulcer, unspecified as acute or chronic, without hemorrhage or perforation: Secondary | ICD-10-CM | POA: Insufficient documentation

## 2022-12-18 DIAGNOSIS — Z1152 Encounter for screening for COVID-19: Secondary | ICD-10-CM | POA: Insufficient documentation

## 2022-12-18 DIAGNOSIS — Z79899 Other long term (current) drug therapy: Secondary | ICD-10-CM | POA: Insufficient documentation

## 2022-12-18 DIAGNOSIS — E876 Hypokalemia: Secondary | ICD-10-CM | POA: Insufficient documentation

## 2022-12-18 LAB — COMPREHENSIVE METABOLIC PANEL
ALT: 27 U/L (ref 0–44)
AST: 21 U/L (ref 15–41)
Albumin: 4.2 g/dL (ref 3.5–5.0)
Alkaline Phosphatase: 92 U/L (ref 38–126)
Anion gap: 12 (ref 5–15)
BUN: 8 mg/dL (ref 6–20)
CO2: 22 mmol/L (ref 22–32)
Calcium: 9.3 mg/dL (ref 8.9–10.3)
Chloride: 103 mmol/L (ref 98–111)
Creatinine, Ser: 0.72 mg/dL (ref 0.44–1.00)
GFR, Estimated: >60 mL/min (ref >60)
Glucose, Bld: 128 mg/dL — ABNORMAL HIGH (ref 70–99)
Potassium: 3.3 mmol/L — ABNORMAL LOW (ref 3.5–5.1)
Sodium: 137 mmol/L (ref 135–145)
Total Bilirubin: 1 mg/dL (ref 0.3–1.2)
Total Protein: 7.7 g/dL (ref 6.5–8.1)

## 2022-12-18 LAB — CBC
HCT: 44.9 % (ref 36.0–46.0)
Hemoglobin: 15.2 g/dL — ABNORMAL HIGH (ref 12.0–15.0)
MCH: 28.6 pg (ref 26.0–34.0)
MCHC: 33.9 g/dL (ref 30.0–36.0)
MCV: 84.6 fL (ref 80.0–100.0)
Platelets: 462 10*3/uL — ABNORMAL HIGH (ref 150–400)
RBC: 5.31 MIL/uL — ABNORMAL HIGH (ref 3.87–5.11)
RDW: 12.8 % (ref 11.5–15.5)
WBC: 18 10*3/uL — ABNORMAL HIGH (ref 4.0–10.5)
nRBC: 0 % (ref 0.0–0.2)

## 2022-12-18 LAB — URINALYSIS, ROUTINE W REFLEX MICROSCOPIC
Bilirubin Urine: NEGATIVE
Glucose, UA: NEGATIVE mg/dL
Hgb urine dipstick: NEGATIVE
Ketones, ur: NEGATIVE mg/dL
Leukocytes,Ua: NEGATIVE
Nitrite: NEGATIVE
Protein, ur: NEGATIVE mg/dL
Specific Gravity, Urine: 1.013 (ref 1.005–1.030)
pH: 6 (ref 5.0–8.0)

## 2022-12-18 LAB — LIPASE, BLOOD: Lipase: 240 U/L — ABNORMAL HIGH (ref 11–51)

## 2022-12-18 LAB — POC URINE PREG, ED: Preg Test, Ur: NEGATIVE

## 2022-12-18 NOTE — ED Triage Notes (Signed)
C/O vomiting after eating and abd pain, upper middle area x 4 weeks.  AAOx3.  Skin warm and dry .NAD

## 2022-12-18 NOTE — ED Provider Triage Note (Signed)
Emergency Medicine Provider Triage Evaluation Note  Tricia Kerr , a 37 y.o. female  was evaluated in triage.  Pt complains of epigastric pain x 4 weeks. Pain is worse after vomiting. History of "infection in stomach" (? Diverticulitis) and this feels similar. No blood noted in stool. No fever.  Physical Exam  BP (!) 118/90 (BP Location: Left Arm)   Pulse (!) 113   Temp 98.4 F (36.9 C) (Oral)   Resp 16   Ht 4\' 11"  (1.499 m)   Wt 79 kg   LMP 12/02/2022   SpO2 98%   BMI 35.18 kg/m  Gen:   Awake, no distress   Resp:  Normal effort  MSK:   Moves extremities without difficulty  Other:    Medical Decision Making  Medically screening exam initiated at [place] PM.  Appropriate orders placed.  Tricia Kerr was informed that the remainder of the evaluation will be completed by another provider, this initial triage assessment does not replace that evaluation, and the importance of remaining in the ED until their evaluation is complete.     Victorino Dike, FNP 12/18/22 1815

## 2022-12-19 ENCOUNTER — Encounter: Payer: Self-pay | Admitting: Radiology

## 2022-12-19 ENCOUNTER — Observation Stay
Admission: EM | Admit: 2022-12-19 | Discharge: 2022-12-20 | Disposition: A | Discharge status: Home / Self Care | Payer: Self-pay | Attending: Internal Medicine | Admitting: Internal Medicine

## 2022-12-19 ENCOUNTER — Encounter
Admission: EM | Disposition: A | Discharge status: Home / Self Care | Payer: Self-pay | Source: Home / Self Care | Attending: Emergency Medicine

## 2022-12-19 ENCOUNTER — Emergency Department: Payer: Self-pay

## 2022-12-19 ENCOUNTER — Observation Stay: Payer: Self-pay | Admitting: Certified Registered Nurse Anesthetist

## 2022-12-19 ENCOUNTER — Other Ambulatory Visit: Payer: Self-pay

## 2022-12-19 DIAGNOSIS — Z720 Tobacco use: Secondary | ICD-10-CM | POA: Diagnosis present

## 2022-12-19 DIAGNOSIS — F319 Bipolar disorder, unspecified: Secondary | ICD-10-CM | POA: Diagnosis present

## 2022-12-19 DIAGNOSIS — D75838 Other thrombocytosis: Secondary | ICD-10-CM | POA: Insufficient documentation

## 2022-12-19 DIAGNOSIS — E876 Hypokalemia: Secondary | ICD-10-CM | POA: Insufficient documentation

## 2022-12-19 DIAGNOSIS — K529 Noninfective gastroenteritis and colitis, unspecified: Secondary | ICD-10-CM | POA: Insufficient documentation

## 2022-12-19 DIAGNOSIS — E669 Obesity, unspecified: Secondary | ICD-10-CM | POA: Insufficient documentation

## 2022-12-19 DIAGNOSIS — K810 Acute cholecystitis: Secondary | ICD-10-CM

## 2022-12-19 DIAGNOSIS — K851 Biliary acute pancreatitis without necrosis or infection: Secondary | ICD-10-CM | POA: Diagnosis present

## 2022-12-19 LAB — CBC
HCT: 40.2 % (ref 36.0–46.0)
Hemoglobin: 13.3 g/dL (ref 12.0–15.0)
MCH: 28.2 pg (ref 26.0–34.0)
MCHC: 33.1 g/dL (ref 30.0–36.0)
MCV: 85.4 fL (ref 80.0–100.0)
Platelets: 374 10*3/uL (ref 150–400)
RBC: 4.71 MIL/uL (ref 3.87–5.11)
RDW: 12.7 % (ref 11.5–15.5)
WBC: 17.4 10*3/uL — ABNORMAL HIGH (ref 4.0–10.5)
nRBC: 0 % (ref 0.0–0.2)

## 2022-12-19 LAB — CREATININE, SERUM
Creatinine, Ser: 0.65 mg/dL (ref 0.44–1.00)
GFR, Estimated: >60 mL/min (ref >60)

## 2022-12-19 LAB — RESP PANEL BY RT-PCR (RSV, FLU A&B, COVID)  RVPGX2
Influenza A by PCR: NEGATIVE
Influenza B by PCR: NEGATIVE
Resp Syncytial Virus by PCR: NEGATIVE
SARS Coronavirus 2 by RT PCR: NEGATIVE

## 2022-12-19 LAB — HIV ANTIBODY (ROUTINE TESTING W REFLEX): HIV Screen 4th Generation wRfx: NONREACTIVE

## 2022-12-19 SURGERY — CHOLECYSTECTOMY, ROBOT-ASSISTED, LAPAROSCOPIC
Anesthesia: General | Site: Abdomen

## 2022-12-19 MED ORDER — HYDROMORPHONE HCL 1 MG/ML IJ SOLN
0.5000 mg | INTRAMUSCULAR | Status: AC | PRN
  Administered 2022-12-19: 0.5 mg via INTRAVENOUS
  Filled 2022-12-19: qty 0.5

## 2022-12-19 MED ORDER — PROPOFOL 10 MG/ML IV BOLUS
INTRAVENOUS | Status: DC | PRN
  Administered 2022-12-19: 20 mg via INTRAVENOUS
  Administered 2022-12-19: 150 mg via INTRAVENOUS
  Administered 2022-12-19 (×4): 50 mg via INTRAVENOUS
  Administered 2022-12-19: 30 mg via INTRAVENOUS

## 2022-12-19 MED ORDER — HYDROMORPHONE HCL 1 MG/ML IJ SOLN
INTRAMUSCULAR | Status: DC | PRN
  Administered 2022-12-19 (×2): .5 mg via INTRAVENOUS

## 2022-12-19 MED ORDER — MIDAZOLAM HCL 2 MG/2ML IJ SOLN
INTRAMUSCULAR | Status: AC
  Filled 2022-12-19: qty 2

## 2022-12-19 MED ORDER — PANTOPRAZOLE SODIUM 40 MG PO TBEC
40.0000 mg | DELAYED_RELEASE_TABLET | Freq: Every day | ORAL | Status: DC
  Administered 2022-12-19 – 2022-12-20 (×2): 40 mg via ORAL
  Filled 2022-12-19 (×2): qty 1

## 2022-12-19 MED ORDER — SUCCINYLCHOLINE CHLORIDE 200 MG/10ML IV SOSY
PREFILLED_SYRINGE | INTRAVENOUS | Status: DC | PRN
  Administered 2022-12-19: 100 mg via INTRAVENOUS

## 2022-12-19 MED ORDER — LACTATED RINGERS IV SOLN: INTRAVENOUS | Status: DC | PRN

## 2022-12-19 MED ORDER — ONDANSETRON HCL 4 MG/2ML IJ SOLN: 4.0000 mg | Freq: Three times a day (TID) | INTRAMUSCULAR | Status: AC | PRN

## 2022-12-19 MED ORDER — MORPHINE SULFATE (PF) 4 MG/ML IV SOLN
4.0000 mg | Freq: Once | INTRAVENOUS | Status: AC
  Administered 2022-12-19: 4 mg via INTRAVENOUS
  Filled 2022-12-19: qty 1

## 2022-12-19 MED ORDER — DEXAMETHASONE SODIUM PHOSPHATE 10 MG/ML IJ SOLN
INTRAMUSCULAR | Status: DC | PRN
  Administered 2022-12-19: 10 mg via INTRAVENOUS

## 2022-12-19 MED ORDER — PROPOFOL 10 MG/ML IV BOLUS
INTRAVENOUS | Status: AC
  Filled 2022-12-19: qty 20

## 2022-12-19 MED ORDER — SODIUM CHLORIDE 0.9 % IV SOLN
2.0000 g | INTRAVENOUS | Status: AC
  Administered 2022-12-19: 2 g via INTRAVENOUS

## 2022-12-19 MED ORDER — OXYCODONE HCL 5 MG/5ML PO SOLN: 5.0000 mg | Freq: Once | ORAL | Status: DC | PRN

## 2022-12-19 MED ORDER — INDOCYANINE GREEN 25 MG IV SOLR
2.5000 mg | INTRAVENOUS | Status: AC
  Administered 2022-12-19: 2.5 mg via INTRAVENOUS
  Filled 2022-12-19: qty 1

## 2022-12-19 MED ORDER — LABETALOL HCL 5 MG/ML IV SOLN
INTRAVENOUS | Status: DC | PRN
  Administered 2022-12-19 (×5): 5 mg via INTRAVENOUS

## 2022-12-19 MED ORDER — KETOROLAC TROMETHAMINE 30 MG/ML IJ SOLN
30.0000 mg | Freq: Four times a day (QID) | INTRAMUSCULAR | Status: DC
  Administered 2022-12-19 – 2022-12-20 (×3): 30 mg via INTRAVENOUS
  Filled 2022-12-19 (×3): qty 1

## 2022-12-19 MED ORDER — EPHEDRINE SULFATE (PRESSORS) 50 MG/ML IJ SOLN
INTRAMUSCULAR | Status: DC | PRN
  Administered 2022-12-19: 10 mg via INTRAVENOUS

## 2022-12-19 MED ORDER — ONDANSETRON HCL 4 MG/2ML IJ SOLN: 4.0000 mg | Freq: Once | INTRAMUSCULAR | Status: DC | PRN

## 2022-12-19 MED ORDER — SUGAMMADEX SODIUM 200 MG/2ML IV SOLN
INTRAVENOUS | Status: DC | PRN
  Administered 2022-12-19: 200 mg via INTRAVENOUS

## 2022-12-19 MED ORDER — LACTATED RINGERS IV BOLUS
1000.0000 mL | Freq: Once | INTRAVENOUS | Status: AC
  Administered 2022-12-19: 1000 mL via INTRAVENOUS

## 2022-12-19 MED ORDER — ONDANSETRON HCL 4 MG/2ML IJ SOLN
4.0000 mg | Freq: Once | INTRAMUSCULAR | Status: AC
  Administered 2022-12-19: 4 mg via INTRAVENOUS
  Filled 2022-12-19: qty 2

## 2022-12-19 MED ORDER — SODIUM CHLORIDE 0.9 % IV SOLN: 2.0000 g | INTRAVENOUS | Status: DC

## 2022-12-19 MED ORDER — LIDOCAINE HCL (CARDIAC) PF 100 MG/5ML IV SOSY
PREFILLED_SYRINGE | INTRAVENOUS | Status: DC | PRN
  Administered 2022-12-19: 100 mg via INTRAVENOUS

## 2022-12-19 MED ORDER — PHENYLEPHRINE 80 MCG/ML (10ML) SYRINGE FOR IV PUSH (FOR BLOOD PRESSURE SUPPORT)
PREFILLED_SYRINGE | INTRAVENOUS | Status: DC | PRN
  Administered 2022-12-19: 40 ug via INTRAVENOUS
  Administered 2022-12-19: 80 ug via INTRAVENOUS

## 2022-12-19 MED ORDER — CARIPRAZINE HCL 4.5 MG PO CAPS: 4.5000 mg | ORAL_CAPSULE | Freq: Every day | ORAL | Status: DC

## 2022-12-19 MED ORDER — FENTANYL CITRATE (PF) 100 MCG/2ML IJ SOLN
INTRAMUSCULAR | Status: AC
  Filled 2022-12-19: qty 2

## 2022-12-19 MED ORDER — PROPOFOL 1000 MG/100ML IV EMUL
INTRAVENOUS | Status: AC
  Filled 2022-12-19: qty 100

## 2022-12-19 MED ORDER — KCL-LACTATED RINGERS 20 MEQ/L IV SOLN
INTRAVENOUS | Status: DC
  Filled 2022-12-19 (×6): qty 1000

## 2022-12-19 MED ORDER — SODIUM CHLORIDE 0.9 % IV SOLN
INTRAVENOUS | Status: AC
  Filled 2022-12-19: qty 2

## 2022-12-19 MED ORDER — ENOXAPARIN SODIUM 40 MG/0.4ML IJ SOSY
40.0000 mg | PREFILLED_SYRINGE | INTRAMUSCULAR | Status: DC
  Administered 2022-12-20: 40 mg via SUBCUTANEOUS
  Filled 2022-12-19: qty 0.4

## 2022-12-19 MED ORDER — HALOPERIDOL 5 MG PO TABS
15.0000 mg | ORAL_TABLET | Freq: Every day | ORAL | Status: DC
  Filled 2022-12-19: qty 3

## 2022-12-19 MED ORDER — KETOROLAC TROMETHAMINE 30 MG/ML IJ SOLN: 30.0000 mg | Freq: Four times a day (QID) | INTRAMUSCULAR | Status: DC

## 2022-12-19 MED ORDER — OXYCODONE HCL 5 MG PO TABS: 5.0000 mg | ORAL_TABLET | Freq: Once | ORAL | Status: DC | PRN

## 2022-12-19 MED ORDER — ROCURONIUM BROMIDE 100 MG/10ML IV SOLN
INTRAVENOUS | Status: DC | PRN
  Administered 2022-12-19: 50 mg via INTRAVENOUS

## 2022-12-19 MED ORDER — FENTANYL CITRATE (PF) 100 MCG/2ML IJ SOLN
25.0000 ug | INTRAMUSCULAR | Status: DC | PRN
  Administered 2022-12-19 (×3): 25 ug via INTRAVENOUS

## 2022-12-19 MED ORDER — PROPOFOL 500 MG/50ML IV EMUL
INTRAVENOUS | Status: DC | PRN
  Administered 2022-12-19: 25 ug/kg/min via INTRAVENOUS

## 2022-12-19 MED ORDER — FENTANYL CITRATE (PF) 100 MCG/2ML IJ SOLN
INTRAMUSCULAR | Status: AC
  Administered 2022-12-19: 25 ug via INTRAVENOUS
  Filled 2022-12-19: qty 2

## 2022-12-19 MED ORDER — LACTATED RINGERS IV SOLN: INTRAVENOUS | Status: DC

## 2022-12-19 MED ORDER — EPINEPHRINE PF 1 MG/ML IJ SOLN
INTRAMUSCULAR | Status: AC
  Filled 2022-12-19: qty 1

## 2022-12-19 MED ORDER — BUPIVACAINE LIPOSOME 1.3 % IJ SUSP
INTRAMUSCULAR | Status: AC
  Filled 2022-12-19: qty 20

## 2022-12-19 MED ORDER — DEXMEDETOMIDINE HCL IN NACL 80 MCG/20ML IV SOLN
INTRAVENOUS | Status: DC | PRN
  Administered 2022-12-19 (×2): 16 ug via BUCCAL

## 2022-12-19 MED ORDER — HYDROMORPHONE HCL 1 MG/ML IJ SOLN
INTRAMUSCULAR | Status: AC
  Filled 2022-12-19: qty 1

## 2022-12-19 MED ORDER — ACETAMINOPHEN 10 MG/ML IV SOLN
INTRAVENOUS | Status: AC
  Administered 2022-12-19: 1000 mg via INTRAVENOUS
  Filled 2022-12-19: qty 100

## 2022-12-19 MED ORDER — ONDANSETRON HCL 4 MG/2ML IJ SOLN: INTRAMUSCULAR | Status: DC | PRN

## 2022-12-19 MED ORDER — ACETAMINOPHEN 500 MG PO TABS
1000.0000 mg | ORAL_TABLET | Freq: Four times a day (QID) | ORAL | Status: DC
  Administered 2022-12-20: 1000 mg via ORAL
  Filled 2022-12-19 (×2): qty 2

## 2022-12-19 MED ORDER — OXYCODONE HCL 5 MG PO TABS
5.0000 mg | ORAL_TABLET | ORAL | Status: DC | PRN
  Administered 2022-12-20: 5 mg via ORAL
  Filled 2022-12-19: qty 1

## 2022-12-19 MED ORDER — IOHEXOL 300 MG/ML  SOLN
100.0000 mL | Freq: Once | INTRAMUSCULAR | Status: AC | PRN
Start: 2022-12-19 — End: 2022-12-19
  Administered 2022-12-19: 100 mL via INTRAVENOUS

## 2022-12-19 MED ORDER — POTASSIUM CHLORIDE CRYS ER 20 MEQ PO TBCR
40.0000 meq | EXTENDED_RELEASE_TABLET | Freq: Once | ORAL | Status: AC
  Administered 2022-12-19: 40 meq via ORAL
  Filled 2022-12-19: qty 2

## 2022-12-19 MED ORDER — ENOXAPARIN SODIUM 40 MG/0.4ML IJ SOSY: 40.0000 mg | PREFILLED_SYRINGE | INTRAMUSCULAR | Status: DC

## 2022-12-19 MED ORDER — ESMOLOL HCL 100 MG/10ML IV SOLN
INTRAVENOUS | Status: DC | PRN
  Administered 2022-12-19 (×5): 20 mg via INTRAVENOUS

## 2022-12-19 MED ORDER — BUPIVACAINE HCL (PF) 0.25 % IJ SOLN
INTRAMUSCULAR | Status: AC
  Filled 2022-12-19: qty 30

## 2022-12-19 MED ORDER — HYDRALAZINE HCL 20 MG/ML IJ SOLN
INTRAMUSCULAR | Status: DC | PRN
  Administered 2022-12-19: 4 mg via INTRAVENOUS

## 2022-12-19 MED ORDER — ACETAMINOPHEN 10 MG/ML IV SOLN: 1000.0000 mg | Freq: Once | INTRAVENOUS | Status: DC | PRN

## 2022-12-19 MED ORDER — POTASSIUM CHLORIDE 2 MEQ/ML IV SOLN
INTRAVENOUS | Status: DC
  Filled 2022-12-19 (×5): qty 1000

## 2022-12-19 MED ORDER — FENTANYL CITRATE (PF) 100 MCG/2ML IJ SOLN
INTRAMUSCULAR | Status: AC
  Administered 2022-12-19: 50 ug via INTRAVENOUS
  Filled 2022-12-19: qty 2

## 2022-12-19 MED ORDER — METOPROLOL SUCCINATE ER 25 MG PO TB24
50.0000 mg | ORAL_TABLET | Freq: Every day | ORAL | Status: DC
  Filled 2022-12-19: qty 2

## 2022-12-19 MED ORDER — MIDAZOLAM HCL 2 MG/2ML IJ SOLN
INTRAMUSCULAR | Status: DC | PRN
  Administered 2022-12-19: 2 mg via INTRAVENOUS

## 2022-12-19 MED ORDER — POTASSIUM CHLORIDE 2 MEQ/ML IV SOLN
INTRAVENOUS | Status: DC
  Filled 2022-12-19: qty 1000

## 2022-12-19 MED ORDER — 0.9 % SODIUM CHLORIDE (POUR BTL) OPTIME
TOPICAL | Status: DC | PRN
  Administered 2022-12-19: 500 mL

## 2022-12-19 MED ORDER — POTASSIUM CHLORIDE 10 MEQ/100ML IV SOLN: 10.0000 meq | Freq: Once | INTRAVENOUS | Status: DC

## 2022-12-19 MED ORDER — FENTANYL CITRATE (PF) 100 MCG/2ML IJ SOLN
INTRAMUSCULAR | Status: DC | PRN
  Administered 2022-12-19 (×3): 50 ug via INTRAVENOUS
  Administered 2022-12-19: 100 ug via INTRAVENOUS
  Administered 2022-12-19: 50 ug via INTRAVENOUS

## 2022-12-19 MED ORDER — BUPIVACAINE-EPINEPHRINE 0.25% -1:200000 IJ SOLN
INTRAMUSCULAR | Status: DC | PRN
  Administered 2022-12-19: 50 mL via SURGICAL_CAVITY

## 2022-12-19 SURGICAL SUPPLY — 48 items
CANNULA REDUC XI 12-8 STAPL (CANNULA) ×1
CANNULA REDUCER 12-8 DVNC XI (CANNULA) ×1 IMPLANT
CATH REDDICK CHOLANGI 4FR 50CM (CATHETERS) IMPLANT
CLIP LIGATING HEMO O LOK GREEN (MISCELLANEOUS) ×1 IMPLANT
DERMABOND ADVANCED .7 DNX12 (GAUZE/BANDAGES/DRESSINGS) ×1 IMPLANT
DRAPE ARM DVNC X/XI (DISPOSABLE) ×4 IMPLANT
DRAPE COLUMN DVNC XI (DISPOSABLE) ×1 IMPLANT
DRAPE DA VINCI XI ARM (DISPOSABLE) ×4
DRAPE DA VINCI XI COLUMN (DISPOSABLE) ×1
ELECT CAUTERY BLADE 6.4 (BLADE) ×1 IMPLANT
ELECT REM PT RETURN 9FT ADLT (ELECTROSURGICAL) ×1
ELECTRODE REM PT RTRN 9FT ADLT (ELECTROSURGICAL) ×1 IMPLANT
GLOVE BIO SURGEON STRL SZ7 (GLOVE) ×2 IMPLANT
GOWN STRL REUS W/ TWL LRG LVL3 (GOWN DISPOSABLE) ×4 IMPLANT
GOWN STRL REUS W/TWL LRG LVL3 (GOWN DISPOSABLE) ×4
IRRIGATION STRYKERFLOW (MISCELLANEOUS) IMPLANT
IRRIGATOR STRYKERFLOW (MISCELLANEOUS) ×1
IV CATH ANGIO 12GX3 LT BLUE (NEEDLE) IMPLANT
KIT PINK PAD W/HEAD ARE REST (MISCELLANEOUS) ×1 IMPLANT
KIT PINK PAD W/HEAD ARM REST (MISCELLANEOUS) ×1 IMPLANT
LABEL OR SOLS (LABEL) ×1 IMPLANT
MANIFOLD NEPTUNE II (INSTRUMENTS) ×1 IMPLANT
NEEDLE HYPO 22GX1.5 SAFETY (NEEDLE) ×1 IMPLANT
NS IRRIG 500ML POUR BTL (IV SOLUTION) ×1 IMPLANT
OBTURATOR OPTICAL STANDARD 8MM (TROCAR) ×1
OBTURATOR OPTICAL STND 8 DVNC (TROCAR) ×1
OBTURATOR OPTICALSTD 8 DVNC (TROCAR) ×1 IMPLANT
PACK LAP CHOLECYSTECTOMY (MISCELLANEOUS) ×1 IMPLANT
PENCIL SMOKE EVACUATOR (MISCELLANEOUS) ×1 IMPLANT
SEAL CANN UNIV 5-8 DVNC XI (MISCELLANEOUS) ×3 IMPLANT
SEAL XI 5MM-8MM UNIVERSAL (MISCELLANEOUS) ×3
SET TUBE SMOKE EVAC HIGH FLOW (TUBING) ×1 IMPLANT
SOLUTION ELECTROLUBE (MISCELLANEOUS) ×1 IMPLANT
SPIKE FLUID TRANSFER (MISCELLANEOUS) ×1 IMPLANT
SPONGE T-LAP 18X18 ~~LOC~~+RFID (SPONGE) ×1 IMPLANT
SPONGE T-LAP 4X18 ~~LOC~~+RFID (SPONGE) IMPLANT
STAPLER CANNULA SEAL DVNC XI (STAPLE) ×1 IMPLANT
STAPLER CANNULA SEAL XI (STAPLE) ×1
STOPCOCK 3 WAY MALE LL (IV SETS)
STOPCOCK 3WAY MALE LL (IV SETS) IMPLANT
SUT MNCRL AB 4-0 PS2 18 (SUTURE) ×1 IMPLANT
SUT VICRYL 0 UR6 27IN ABS (SUTURE) ×2 IMPLANT
SYR 30ML LL (SYRINGE) ×1 IMPLANT
SYS BAG RETRIEVAL 10MM (BASKET) ×1
SYSTEM BAG RETRIEVAL 10MM (BASKET) ×1 IMPLANT
TRAP FLUID SMOKE EVACUATOR (MISCELLANEOUS) ×1 IMPLANT
WATER STERILE IRR 3000ML UROMA (IV SOLUTION) IMPLANT
WATER STERILE IRR 500ML POUR (IV SOLUTION) ×1 IMPLANT

## 2022-12-19 NOTE — Op Note (Signed)
Robotic assisted laparoscopic Cholecystectomy  Pre-operative Diagnosis: Acute cholecystitis, ? Gallstone pancreatitis  Post-operative Diagnosis: Acute cholecystitis  Procedure:  Robotic assisted laparoscopic Cholecystectomy  Surgeon: Sterling Big, MD FACS  Anesthesia: Gen. with endotracheal tube  Findings: Acute moderate Cholecystitis   Estimated Blood Loss: 20cc       Specimens: Gallbladder           Complications: none   Procedure Details  The patient was seen again in the Holding Room. The benefits, complications, treatment options, and expected outcomes were discussed with the patient. The risks of bleeding, infection, recurrence of symptoms, failure to resolve symptoms, bile duct damage, bile duct leak, retained common bile duct stone, bowel injury, any of which could require further surgery and/or ERCP, stent, or papillotomy were reviewed with the patient. The likelihood of improving the patient's symptoms with return to their baseline status is good.  The patient and/or family concurred with the proposed plan, giving informed consent.  The patient was taken to Operating Room, identified  and the procedure verified as Laparoscopic Cholecystectomy.  A Time Out was held and the above information confirmed.  Prior to the induction of general anesthesia, antibiotic prophylaxis was administered. VTE prophylaxis was in place. General endotracheal anesthesia was then administered and tolerated well. After the induction, the abdomen was prepped with Chloraprep and draped in the sterile fashion. The patient was positioned in the supine position.  Cut down technique was used to enter the abdominal cavity and a Hasson trochar was placed after two vicryl stitches were anchored to the fascia. Pneumoperitoneum was then created with CO2 and tolerated well without any adverse changes in the patient's vital signs.  Three 8-mm ports were placed under direct vision. All skin incisions  were  infiltrated with a local anesthetic agent before making the incision and placing the trocars.   The patient was positioned  in reverse Trendelenburg, robot was brought to the surgical field and docked in the standard fashion.  We made sure all the instrumentation was kept indirect view at all times and that there were no collision between the arms. I scrubbed out and went to the console.  The gallbladder was identified, The omentum was plastered to the GB. Meticulous dissection was doen to divide the omentum from the GB. the fundus grasped and retracted cephalad. Adhesions were lysed bluntly. The infundibulum was grasped and retracted laterally, exposing the peritoneum overlying the triangle of Calot. This was then divided and exposed in a blunt fashion. An extended critical view of the cystic duct and cystic artery was obtained.  The cystic duct was clearly identified and bluntly dissected.   Artery and duct were double clipped and divided. Using ICG cholangiography we visualize the cystic duct and CBD w/o evidence of bile injuries. The gallbladder was taken from the gallbladder fossa in a retrograde fashion with the electrocautery.  I irrigated the fossa due to inflammatory response. Hemostasis was achieved with the electrocautery. nspection of the right upper quadrant was performed. No bleeding, bile duct injury or leak, or bowel injury was noted. Robotic instruments and robotic arms were undocked in the standard fashion.  I scrubbed back in.  The gallbladder was removed and placed in an Endocatch bag.   Pneumoperitoneum was released.  The periumbilical port site was closed with interrumpted 0 Vicryl sutures. 4-0 subcuticular Monocryl was used to close the skin. Dermabond was  applied.  The patient was then extubated and brought to the recovery room in stable condition. Sponge, lap, and needle  counts were correct at closure and at the conclusion of the case.               Caroleen Hamman, MD, FACS

## 2022-12-19 NOTE — Anesthesia Preprocedure Evaluation (Addendum)
Anesthesia Evaluation  Patient identified by MRN, date of birth, ID band Patient awake    Reviewed: Allergy & Precautions, NPO status , Patient's Chart, lab work & pertinent test results  History of Anesthesia Complications Negative for: history of anesthetic complications  Airway Mallampati: III   Neck ROM: Full    Dental  (+) Chipped   Pulmonary Current Smoker (less than 1 ppd) and Patient abstained from smoking.   Pulmonary exam normal breath sounds clear to auscultation       Cardiovascular Exercise Tolerance: Good Normal cardiovascular exam Rhythm:Regular Rate:Normal  ECG 12/18/22:  Sinus tachycardia (HR 107) Otherwise normal ECG When compared with ECG of 19-Jun-2021 12:02, No significant change was found   Neuro/Psych  PSYCHIATRIC DISORDERS Anxiety  Bipolar Disorder   Chronic lower back pain    GI/Hepatic negative GI ROS,,,  Endo/Other  Obesity   Renal/GU negative Renal ROS     Musculoskeletal   Abdominal   Peds  Hematology negative hematology ROS (+)   Anesthesia Other Findings   Reproductive/Obstetrics                             Anesthesia Physical Anesthesia Plan  ASA: 2  Anesthesia Plan: General   Post-op Pain Management:    Induction: Intravenous  PONV Risk Score and Plan: 2 and Ondansetron, Dexamethasone and Treatment may vary due to age or medical condition  Airway Management Planned: Oral ETT  Additional Equipment:   Intra-op Plan:   Post-operative Plan: Extubation in OR  Informed Consent: I have reviewed the patients History and Physical, chart, labs and discussed the procedure including the risks, benefits and alternatives for the proposed anesthesia with the patient or authorized representative who has indicated his/her understanding and acceptance.     Dental advisory given  Plan Discussed with: CRNA  Anesthesia Plan Comments: (Patient consented  for risks of anesthesia including but not limited to:  - adverse reactions to medications - damage to eyes, teeth, lips or other oral mucosa - nerve damage due to positioning  - sore throat or hoarseness - damage to heart, brain, nerves, lungs, other parts of body or loss of life  Informed patient about role of CRNA in peri- and intra-operative care.  Patient voiced understanding.)        Anesthesia Quick Evaluation

## 2022-12-19 NOTE — ED Notes (Signed)
Pt to CT via stretcher at this time

## 2022-12-19 NOTE — Consult Note (Signed)
[facility] SURGICAL CONSULTATION NOTE (initial) - cpt: 99254   HISTORY OF PRESENT ILLNESS (HPI):  37 y.o. female presented to [facility] ED today for evaluation of abdominal pain. Patient reports around a 3 week history of right upper quadrant discomfort with associated intermittent nausea and emesis. Nothing seems to bring on the pain but did endorse some post-prandial pain. No fever, chills, CP, SOB, bowel changes, nor juandice. She denied any history or similar prior to this episode. Previous surgical history positive for appendectomy. Work up in the ED revealed a leukocytosis to 18.0K (now 17.4K), mild hypokalemia to 3.3, lipase to 240. CT Abdomen/Pelvis revealed a contracted gallbladder with large gallstones. Ultimately admitted to the medicine service for intractable abdominal pain and cholelithiasis.   Surgery is consulted by hospitalist physician Dr. Sharen Hones, MD in this context for evaluation and management of cholelithiasis.  PAST MEDICAL HISTORY (PMH):  Past Medical History:  Diagnosis Date   Anxiety    Bipolar disorder ([place])      PAST SURGICAL HISTORY ([place]):  Past Surgical History:  Procedure Laterality Date   APPENDECTOMY       MEDICATIONS:  Prior to Admission medications   Medication Sig Start Date End Date Taking? Authorizing Provider  benztropine (COGENTIN) 0.5 MG tablet Take 1 tablet (0.5 mg total) by mouth at bedtime. Patient not taking: Reported on 01/31/2022 06/30/21   Sherlon Handing, NP  Cariprazine HCl (VRAYLAR) 4.5 MG CAPS Take 4.5 mg by mouth. Patient not taking: Reported on 12/19/2022    [provider]  haloperidol (HALDOL) 5 MG tablet Take 4 tablets (20 mg total) by mouth at bedtime. Patient not taking: Reported on 12/19/2022 06/30/21   Sherlon Handing, NP  haloperidol decanoate (HALDOL DECANOATE) 100 MG/ML injection Inject 2 mLs (200 mg total) into the muscle every 30 (thirty) days. For IM administration ONLY! First one given  06/29/2021. Next due 07/29/2021 Patient not taking: Reported on 12/19/2022 07/29/21   Sherlon Handing, NP  metoprolol succinate (TOPROL-XL) 50 MG 24 hr tablet Take 1 tablet (50 mg total) by mouth daily. Patient not taking: Reported on 12/19/2022 07/01/21   Sherlon Handing, NP     ALLERGIES:  Allergies  Allergen Reactions   Nicotine Anaphylaxis    Nicotine patches      SOCIAL HISTORY:  Social History   Socioeconomic History   Marital status: Married    Spouse name: Not on file   Number of children: Not on file   Years of education: Not on file   Highest education level: Not on file  Occupational History   Not on file  Tobacco Use   Smoking status: Every Day    Packs/day: 1.00    Types: Cigarettes   Smokeless tobacco: Never  Vaping Use   Vaping Use: Never used  Substance and Sexual Activity   Alcohol use: Yes    Comment: Socially   Drug use: Not Currently    Types: Marijuana, Cocaine, Benzodiazepines   Sexual activity: Not Currently    Partners: Male  Other Topics Concern   Not on file  Social History Narrative   Not on file   Social Determinants of Health   Financial Resource Strain: Not on file  Food Insecurity: No Food Insecurity (12/19/2022)   Hunger Vital Sign    Worried About Running Out of Food in the Last Year: Never true    Ran Out of Food in the Last Year: Never true  Transportation Needs: No Transportation Needs (12/19/2022)  PRAPARE - Administrator, Civil Service (Medical): No    Lack of Transportation (Non-Medical): No  Physical Activity: Not on file  Stress: Not on file  Social Connections: Not on file  Intimate Partner Violence: Not At Risk (12/19/2022)   Humiliation, Afraid, Rape, and Kick questionnaire    Fear of Current or Ex-Partner: No    Emotionally Abused: No    Physically Abused: No    Sexually Abused: No     FAMILY HISTORY:  No family history on file.    REVIEW OF SYSTEMS:  Review of Systems  Constitutional:  Negative  for chills and fever.  HENT:  Negative for congestion and sore throat.   Respiratory:  Negative for cough and shortness of breath.   Cardiovascular:  Negative for chest pain and palpitations.  Gastrointestinal:  Positive for abdominal pain, nausea and vomiting. Negative for constipation and diarrhea.  Genitourinary:  Negative for dysuria and urgency.  All other systems reviewed and are negative.   VITAL SIGNS:  Temp:  [97.9 F (36.6 C)-98.5 F (36.9 C)] 97.9 F (36.6 C) (01/09 0851) Pulse Rate:  [69-113] 69 (01/09 1137) Resp:  [16-20] 20 (01/09 1137) BP: (118-141)/(62-95) 120/68 (01/09 1137) SpO2:  [97 %-98 %] 98 % (01/09 1137) Weight:  [79 kg] 79 kg (01/08 1800)     Height: 4\' 11"  (149.9 cm) Weight: 79 kg BMI (Calculated): 35.16   INTAKE/OUTPUT:  01/08 0701 - 01/09 0700 In: 1000 [IV Piggyback:1000] Out: -   PHYSICAL EXAM:  Physical Exam Vitals and nursing note reviewed. Exam conducted with a chaperone present.      Labs:     Latest Ref Rng & Units 12/19/2022    8:48 AM 12/18/2022    6:12 PM 06/29/2021    1:39 PM  CBC  WBC 4.0 - 10.5 K/uL 17.4  18.0  10.1   Hemoglobin 12.0 - 15.0 g/dL 07/01/2021  52.7  78.2   Hematocrit 36.0 - 46.0 % 40.2  44.9  41.0   Platelets 150 - 400 K/uL 374  462  286       Latest Ref Rng & Units 12/19/2022    8:48 AM 12/18/2022    6:12 PM 06/29/2021    1:39 PM  CMP  Glucose 70 - 99 mg/dL  07/01/2021    BUN 6 - 20 mg/dL  8    Creatinine 536 - 1.00 mg/dL 1.44  3.15    Sodium 4.00 - 145 mmol/L  137    Potassium 3.5 - 5.1 mmol/L  3.3  4.1   Chloride 98 - 111 mmol/L  103    CO2 22 - 32 mmol/L  22    Calcium 8.9 - 10.3 mg/dL  9.3    Total Protein 6.5 - 8.1 g/dL  7.7    Total Bilirubin 0.3 - 1.2 mg/dL  1.0    Alkaline Phos 38 - 126 U/L  92    AST 15 - 41 U/L  21    ALT 0 - 44 U/L  27       Imaging studies:   CT Abdomen/Pelvis (12/19/2021) personally reviewed showing contracted gallbladder around large gallstones, and radiologist report reviewed below:   IMPRESSION: 1. Pancreas is morphologically normal in appearance without peripancreatic inflammatory changes or altered perfusion. 2. At least 2 large noncalcified gallstones are noted within the gallbladder. Gallbladder is completely contracted around the stones. No pericholecystic fluid or surrounding inflammatory changes to indicate an acute cholecystitis at this time. Similar findings were  present on prior noncontrast CT 06/01/2021. 3. No other acute findings are noted elsewhere in the abdomen or pelvis to account for the patient's symptoms.   Assessment/Plan: (ICD-10's: K81.0) 37 y.o. female with around 3 week history of abdominal pain, nausea, emesis suspect secondary to symptomatic cholelithiasis vs acute/chronic cholecystitis   - Will plan for robotic assisted laparoscopic cholecystectomy this afternoon with Dr Everlene Farrier pending OR/Anesthesia availability  - All risks, benefits, and alternatives to above procedure(s) were discussed with the patient, all of her questions were answered to her expressed satisfaction, patient expresses she wishes to proceed, and informed consent was obtained.    - NPO + IVF  - Abx for surgical prophylaxis on call to OR  - ICG on call to OR   - Monitor abdominal examination  - Pain control prn; antiemetics prn  - Further management per primary service; we will follow   All of the above findings and recommendations were discussed with the patient, and all of patient's questions were answered to her expressed satisfaction.  Thank you for the opportunity to participate in this patient's care.   -- Lynden Oxford, PA-C [facility] 12/19/2022, 12:58 PM M-F: 7am - 4pm

## 2022-12-19 NOTE — ED Notes (Signed)
Pt reporting improvement in pain after medication. Resting easy in bed.

## 2022-12-19 NOTE — ED Notes (Signed)
Report given and care endorsed to oncoming shift 

## 2022-12-19 NOTE — Transfer of Care (Signed)
Immediate Anesthesia Transfer of Care Note  Patient: Tricia Kerr  Procedure(s) Performed: XI ROBOTIC ASSISTED LAPAROSCOPIC CHOLECYSTECTOMY (Abdomen) INDOCYANINE GREEN FLUORESCENCE IMAGING (ICG) (Abdomen)  Patient Location: PACU  Anesthesia Type:General  Level of Consciousness: awake and alert   Airway & Oxygen Therapy: Patient Spontanous Breathing and Patient connected to face mask oxygen  Post-op Assessment: Report given to RN and Post -op Vital signs reviewed and stable  Post vital signs: Reviewed and stable  Last Vitals:  Vitals Value Taken Time  BP 127/89 12/19/22 1905  Temp    Pulse 91 12/19/22 1907  Resp 22 12/19/22 1907  SpO2 94 % 12/19/22 1907  Vitals shown include unvalidated device data.  Last Pain:  Vitals:   12/19/22 1517  TempSrc: Temporal  PainSc: 5       Patients Stated Pain Goal: 0 (70/62/37 6283)  Complications: No notable events documented.

## 2022-12-19 NOTE — ED Provider Notes (Signed)
[facility] Provider Note    Event Date/Time   First MD Initiated Contact with Patient 12/19/22 0405     (approximate)   History   Abdominal Pain   HPI  Tricia Kerr is a 37 y.o. female who presents to the ED for evaluation of Abdominal Pain   Patient presents to the ED for evaluation of acute on chronic intermittent epigastric abdominal pain.  She reports intermittent pain for the past 3 to 4 weeks postprandially, but the pain over the past 1 day has been persistent and more severe than typical.  No fever.  Nausea, poor appetite and emesis.   Physical Exam   Triage Vital Signs: ED Triage Vitals  Enc Vitals Group     BP 12/18/22 1802 (!) 118/90     Pulse Rate 12/18/22 1802 (!) 113     Resp 12/18/22 1802 16     Temp 12/18/22 1802 98.4 F (36.9 C)     Temp Source 12/18/22 1802 Oral     SpO2 12/18/22 1802 98 %     Weight 12/18/22 1800 174 lb 2.6 oz (79 kg)     Height 12/18/22 1800 4\' 11"  (1.499 m)     Head Circumference --      Peak Flow --      Pain Score 12/18/22 1800 7     Pain Loc --      Pain Edu? --      Excl. in GC? --     Most recent vital signs: Vitals:   12/19/22 0104 12/19/22 0449  BP: (!) 133/95 (!) 141/62  Pulse: 100 86  Resp: 20 18  Temp: 98.3 F (36.8 C) 98.5 F (36.9 C)  SpO2: 97% 98%    General: Awake, no distress.  CV:  Good peripheral perfusion.  Resp:  Normal effort.  Abd:  No distention.  RUQ and epigastric and periumbilical tenderness without peritoneal features.  Lower abdomen is benign. MSK:  No deformity noted.  Neuro:  No focal deficits appreciated. Other:     ED Results / Procedures / Treatments   Labs (all labs ordered are listed, but only abnormal results are displayed) Labs Reviewed  LIPASE, BLOOD - Abnormal; Notable for the following components:      Result Value   Lipase 240 (*)    All other components within normal limits  COMPREHENSIVE METABOLIC PANEL - Abnormal; Notable for the  following components:   Potassium 3.3 (*)    Glucose, Bld 128 (*)    All other components within normal limits  CBC - Abnormal; Notable for the following components:   WBC 18.0 (*)    RBC 5.31 (*)    Hemoglobin 15.2 (*)    Platelets 462 (*)    All other components within normal limits  URINALYSIS, ROUTINE W REFLEX MICROSCOPIC - Abnormal; Notable for the following components:   Color, Urine YELLOW (*)    APPearance CLOUDY (*)    All other components within normal limits  POC URINE PREG, ED    EKG Sinus tachycardia with rate of 107 bpm.  Normal axis and intervals.  No for signs of acute ischemia.  RADIOLOGY CT abd/pelv interpreted by me without evidence of hepatobiliary obstruction.  Cholelithiasis is noted  Official radiology report(s): CT ABDOMEN PELVIS W CONTRAST  Result Date: 12/19/2022 CLINICAL DATA:  37 year old female with history of pancreatitis. Possible gallstones. Vomiting. Abdominal pain. EXAM: CT ABDOMEN AND PELVIS WITH CONTRAST TECHNIQUE: Multidetector CT imaging of the abdomen and  pelvis was performed using the standard protocol following bolus administration of intravenous contrast. RADIATION DOSE REDUCTION: This exam was performed according to the departmental dose-optimization program which includes automated exposure control, adjustment of the mA and/or kV according to patient size and/or use of iterative reconstruction technique. CONTRAST:  193mL OMNIPAQUE IOHEXOL 300 MG/ML  SOLN COMPARISON:  CT of the abdomen and pelvis 06/01/2021. FINDINGS: Lower chest: Unremarkable. Hepatobiliary: No suspicious cystic or solid hepatic lesions. No intra or extrahepatic biliary ductal dilatation. Gallbladder appears nearly completely contracted around at least to indwelling noncalcified gallstones which measure up to 1.8 cm in diameter. No definite pericholecystic fluid or surrounding inflammatory changes. Pancreas: No pancreatic mass. No pancreatic ductal dilatation. No pancreatic or  peripancreatic fluid collections or inflammatory changes. Pancreatic parenchyma enhances normally. Spleen: Unremarkable. Adrenals/Urinary Tract: Bilateral kidneys and bilateral adrenal glands are normal in appearance. No hydroureteronephrosis. Urinary bladder is nearly completely decompressed, but otherwise unremarkable in appearance. Stomach/Bowel: The appearance of the stomach is normal. There is no pathologic dilatation of small bowel or colon. The appendix is not confidently identified and may be surgically absent. Regardless, there are no inflammatory changes noted adjacent to the cecum to suggest the presence of an acute appendicitis at this time. Vascular/Lymphatic: No significant atherosclerotic disease, aneurysm or dissection noted in the abdominal or pelvic vasculature. No lymphadenopathy noted in the abdomen or pelvis. Reproductive: Uterus and ovaries are unremarkable in appearance. Other: No significant volume of ascites.  No pneumoperitoneum. Musculoskeletal: There are no aggressive appearing lytic or blastic lesions noted in the visualized portions of the skeleton. IMPRESSION: 1. Pancreas is morphologically normal in appearance without peripancreatic inflammatory changes or altered perfusion. 2. At least 2 large noncalcified gallstones are noted within the gallbladder. Gallbladder is completely contracted around the stones. No pericholecystic fluid or surrounding inflammatory changes to indicate an acute cholecystitis at this time. Similar findings were present on prior noncontrast CT 06/01/2021. 3. No other acute findings are noted elsewhere in the abdomen or pelvis to account for the patient's symptoms. Electronically Signed   By: Vinnie Langton M.D.   On: 12/19/2022 05:08    PROCEDURES and INTERVENTIONS:  Procedures  Medications  lactated ringers bolus 1,000 mL (1,000 mLs Intravenous New Bag/Given 12/19/22 0448)  ondansetron (ZOFRAN) injection 4 mg (4 mg Intravenous Given 12/19/22 0447)   morphine (PF) 4 MG/ML injection 4 mg (4 mg Intravenous Given 12/19/22 0447)  iohexol (OMNIPAQUE) 300 MG/ML solution 100 mL (100 mLs Intravenous Contrast Given 12/19/22 0439)     IMPRESSION / MDM / ASSESSMENT AND PLAN / ED COURSE  I reviewed the triage vital signs and the nursing notes.  Differential diagnosis includes, but is not limited to, cholelithiasis, cholecystitis, pancreatitis, SBO  {Patient presents with symptoms of an acute illness or injury that is potentially life-threatening.  37 year old woman presents with worsening postprandial abdominal pain with evidence of gallstone pancreatitis.  Blood work is generally reassuring without signs of hepatobiliary obstruction.  Normal LFTs, but her lipase is elevated.  CBC with all cell lines elevated, indicative of possible dehydration in the setting of her poor intake and emesis at home.  Mild hypokalemia.  Due to her continued symptoms and persistent gallstones.  Consult medicine for admission      FINAL CLINICAL IMPRESSION(S) / ED DIAGNOSES   Final diagnoses:  Acute biliary pancreatitis without infection or necrosis     Rx / DC Orders   ED Discharge Orders     None        Note:  This document was prepared using Dragon voice recognition software and may include unintentional dictation errors.   Delton Prairie, MD 12/19/22 [id number]

## 2022-12-19 NOTE — H&P (Signed)
History and Physical    Patient: Tricia Kerr VHQ:[id number] DOB: [birth year] DOA: 12/19/2022 DOS: the patient was seen and examined on 12/19/2022 PCP: Pcp, No  Patient coming from: Home  Chief Complaint:  Chief Complaint  Patient presents with   Abdominal Pain   HPI: Tricia Kerr is a 37 y.o. female with medical history significant of bipolar, anxiety who presents to the hospital with complaints of abdominal pain.  CT scan showed contracted gallbladder with gallstone.  No obstruction.  Lab study showed leukocytosis with reactive thrombocytosis, potassium 3.3.  Lipase 240.   Patient has been having intermittent abdominal pain for the last 3 to 4 weeks, localized in the upper stomach and the right upper quadrant, cramping-like, 7/10 in severity.  Seem to be worse after eating.  She started have a diarrhea about a week ago, she had 3-4 loose stools a day.  Abdominal cramping pain seems to be worse for the last 4 days.  She did not have any nausea vomiting.  No fever or chills, no urinary symptoms. Review of Systems: As mentioned in the history of present illness. All other systems reviewed and are negative. Past Medical History:  Diagnosis Date   Anxiety    Bipolar disorder [facility])    Past Surgical History:  Procedure Laterality Date   APPENDECTOMY     Social History:  reports that she has been smoking cigarettes. She has been smoking an average of 1 pack per day. She has never used smokeless tobacco. She reports current alcohol use. She reports that she does not currently use drugs after having used the following drugs: Marijuana, Cocaine, and Benzodiazepines.  Allergies  Allergen Reactions   Nicotine Anaphylaxis    Nicotine patches     No family history on file.  Prior to Admission medications   Medication Sig Start Date End Date Taking? Authorizing Provider  benztropine (COGENTIN) 0.5 MG tablet Take 1 tablet (0.5 mg total) by mouth at bedtime. Patient not taking: Reported on  01/31/2022 06/30/21   Sherlon Handing, NP  Cariprazine HCl (VRAYLAR) 4.5 MG CAPS Take 4.5 mg by mouth.    [provider]  haloperidol (HALDOL) 5 MG tablet Take 4 tablets (20 mg total) by mouth at bedtime. 06/30/21   Sherlon Handing, NP  haloperidol decanoate (HALDOL DECANOATE) 100 MG/ML injection Inject 2 mLs (200 mg total) into the muscle every 30 (thirty) days. For IM administration ONLY! First one given 06/29/2021. Next due 07/29/2021 07/29/21   Sherlon Handing, NP  metoprolol succinate (TOPROL-XL) 50 MG 24 hr tablet Take 1 tablet (50 mg total) by mouth daily. 07/01/21   Sherlon Handing, NP    Physical Exam: Vitals:   12/18/22 1800 12/18/22 1802 12/19/22 0104 12/19/22 0449  BP:  (!) 118/90 (!) 133/95 (!) 141/62  Pulse:  (!) 113 100 86  Resp:  16 20 18   Temp:  98.4 F (36.9 C) 98.3 F (36.8 C) 98.5 F (36.9 C)  TempSrc:  Oral Oral Oral  SpO2:  98% 97% 98%  Weight: 79 kg     Height: 4\' 11"  (1.499 m)      Physical Exam Constitutional:      General: She is not in acute distress.    Appearance: She is obese. She is not toxic-appearing.  HENT:     Head: Normocephalic and atraumatic.     Mouth/Throat:     Mouth: Mucous membranes are moist.  Eyes:     Extraocular Movements: Extraocular movements intact.  Pupils: Pupils are equal, round, and reactive to light.  Cardiovascular:     Rate and Rhythm: Normal rate and regular rhythm.     Heart sounds: No murmur heard.    No gallop.  Pulmonary:     Effort: Pulmonary effort is normal. No respiratory distress.     Breath sounds: Normal breath sounds.  Abdominal:     General: Abdomen is flat. Bowel sounds are normal. There is abdominal bruit. There is no distension.     Palpations: There is no hepatomegaly or mass.     Tenderness: There is no right CVA tenderness or left CVA tenderness.     Comments: Tenderness in the epigastric area and the right upper quadrant, Murphy sign negative.  Skin:    General: Skin is warm  and dry.     Coloration: Skin is not jaundiced.  Neurological:     General: No focal deficit present.     Mental Status: She is alert and oriented to person, place, and time.     Cranial Nerves: No cranial nerve deficit.  Psychiatric:        Mood and Affect: Mood normal.        Behavior: Behavior normal.     Data Reviewed:  CT scan, results as above. Reviewed lab results.  Assessment and Plan: Abdominal pain. Minimal elevation of lipase. Symptomatic gallstones. Acute gastroenteritis. Based on history, patient seems to have symptomatic gallstone, there is minimal evidence of acute pancreatitis, lipase only 240, CT scan did not show any evidence of acute pancreatitis. Her symptoms appear to be worse after she started have a diarrhea a week ago.  This could be secondary to gastroenteritis.  Diarrhea is getting better today. I will continue symptomatic treatment with some fluids. Consult general surgery for possible cholecystectomy either as inpatient versus outpatient. Added Protonix.  Hypokalemia. Secondary to recent diarrhea, will give 1 dose of IV potassium in addition to fluids with added potassium.  Check a BMP and magnesium tomorrow.  Reactive thrombocytosis. Recheck CBC tomorrow.  Bipolar 1 disorder. Resume home medicines, history of psychosis when medications discontinued.  Obesity with BMI 35.18. Diet exercise advised.  Tobacco abuse. Advised to quit.    Advance Care Planning:   Code Status: Full Code Full  Consults: General surgery  Family Communication: None  Severity of Illness: The appropriate patient status for this patient is OBSERVATION. Observation status is judged to be reasonable and necessary in order to provide the required intensity of service to ensure the patient's safety. The patient's presenting symptoms, physical exam findings, and initial radiographic and laboratory data in the context of their medical condition is felt to place them at  decreased risk for further clinical deterioration. Furthermore, it is anticipated that the patient will be medically stable for discharge from the hospital within 2 midnights of admission.   Author: Marrion Coy, MD 12/19/2022 8:07 AM  For on call review [email or web address].

## 2022-12-20 DIAGNOSIS — E876 Hypokalemia: Secondary | ICD-10-CM

## 2022-12-20 DIAGNOSIS — E669 Obesity, unspecified: Secondary | ICD-10-CM

## 2022-12-20 DIAGNOSIS — Z720 Tobacco use: Secondary | ICD-10-CM

## 2022-12-20 DIAGNOSIS — D75838 Other thrombocytosis: Secondary | ICD-10-CM

## 2022-12-20 LAB — COMPREHENSIVE METABOLIC PANEL
ALT: 70 U/L — ABNORMAL HIGH (ref 0–44)
AST: 67 U/L — ABNORMAL HIGH (ref 15–41)
Albumin: 3.4 g/dL — ABNORMAL LOW (ref 3.5–5.0)
Alkaline Phosphatase: 92 U/L (ref 38–126)
Anion gap: 8 (ref 5–15)
BUN: 8 mg/dL (ref 6–20)
CO2: 21 mmol/L — ABNORMAL LOW (ref 22–32)
Calcium: 9.1 mg/dL (ref 8.9–10.3)
Chloride: 107 mmol/L (ref 98–111)
Creatinine, Ser: 0.61 mg/dL (ref 0.44–1.00)
GFR, Estimated: >60 mL/min (ref >60)
Glucose, Bld: 130 mg/dL — ABNORMAL HIGH (ref 70–99)
Potassium: 4.4 mmol/L (ref 3.5–5.1)
Sodium: 136 mmol/L (ref 135–145)
Total Bilirubin: 0.8 mg/dL (ref 0.3–1.2)
Total Protein: 6.5 g/dL (ref 6.5–8.1)

## 2022-12-20 LAB — PHOSPHORUS: Phosphorus: 3.5 mg/dL (ref 2.5–4.6)

## 2022-12-20 LAB — CBC
HCT: 40.5 % (ref 36.0–46.0)
Hemoglobin: 13.6 g/dL (ref 12.0–15.0)
MCH: 28.6 pg (ref 26.0–34.0)
MCHC: 33.6 g/dL (ref 30.0–36.0)
MCV: 85.1 fL (ref 80.0–100.0)
Platelets: 393 10*3/uL (ref 150–400)
RBC: 4.76 MIL/uL (ref 3.87–5.11)
RDW: 12.5 % (ref 11.5–15.5)
WBC: 21.8 10*3/uL — ABNORMAL HIGH (ref 4.0–10.5)
nRBC: 0 % (ref 0.0–0.2)

## 2022-12-20 LAB — LIPASE, BLOOD: Lipase: 34 U/L (ref 11–51)

## 2022-12-20 LAB — MAGNESIUM: Magnesium: 1.9 mg/dL (ref 1.7–2.4)

## 2022-12-20 MED ORDER — ACETAMINOPHEN 500 MG PO TABS
1000.0000 mg | ORAL_TABLET | Freq: Four times a day (QID) | ORAL | Status: DC
  Administered 2022-12-20: 1000 mg via ORAL
  Filled 2022-12-20: qty 2

## 2022-12-20 MED ORDER — PANTOPRAZOLE SODIUM 40 MG PO TBEC: 40.0000 mg | DELAYED_RELEASE_TABLET | Freq: Every day | ORAL | 0 refills | Status: DC

## 2022-12-20 MED ORDER — OXYCODONE HCL 5 MG PO TABS: 5.0000 mg | ORAL_TABLET | Freq: Four times a day (QID) | ORAL | 0 refills | Status: DC | PRN

## 2022-12-20 MED ORDER — IBUPROFEN 600 MG PO TABS: 600.0000 mg | ORAL_TABLET | Freq: Four times a day (QID) | ORAL | 0 refills | Status: AC | PRN

## 2022-12-20 MED ORDER — LACTATED RINGERS IV SOLN: INTRAVENOUS | Status: DC

## 2022-12-20 NOTE — Discharge Instructions (Signed)

## 2022-12-20 NOTE — Progress Notes (Signed)
[place] Hospital Day(s): 0.   Post op day(s): 1 Day Post-Op.   Interval History:  Patient seen and examined No acute events or new complaints overnight.  Patient reports she is feeling much better Minimal abdominal soreness No fever, chills, nausea, emesis She does have leukocytosis to 21.8K; I do believe this is likely reactive Renal function remains normal with sCr - 0.61 Minimal LFT elevation consistent with cholecystectomy Bilirubin normal at 0.8 Lipase normalized to 34 She is on FLD; diet advancing this AM  Vital signs in last 24 hours: [min-max] current  Temp:  [96.8 F (36 C)-98.4 F (36.9 C)] 97.9 F (36.6 C) (01/10 0321) Pulse Rate:  [69-99] 72 (01/10 0321) Resp:  [10-25] 18 (01/10 0321) BP: (101-164)/(58-98) 108/70 (01/10 0321) SpO2:  [90 %-98 %] 93 % (01/10 0321) Weight:  [79 kg] 79 kg (01/09 1517)     Height: 4\' 11"  (149.9 cm) Weight: 79 kg BMI (Calculated): 35.16   Intake/Output last 2 shifts:  01/09 0701 - 01/10 0700 In: 2334.9 [P.O.:285; I.V.:1849.9; IV Piggyback:200] Out: 825 [Urine:800; Blood:25]   Physical Exam:  Constitutional: alert, cooperative and no distress  Respiratory: breathing non-labored at rest  Cardiovascular: regular rate and sinus rhythm  Gastrointestinal: soft, non-tender, and non-distended, no rebound/guarding Integumentary: Laparoscopic incisions are CDI with dermabond, no erythema or drainage   Labs:     Latest Ref Rng & Units 12/20/2022    6:02 AM 12/19/2022    8:48 AM 12/18/2022    6:12 PM  CBC  WBC 4.0 - 10.5 K/uL 21.8  17.4  18.0   Hemoglobin 12.0 - 15.0 g/dL 13.6  13.3  15.2   Hematocrit 36.0 - 46.0 % 40.5  40.2  44.9   Platelets 150 - 400 K/uL 393  374  462       Latest Ref Rng & Units 12/19/2022    8:48 AM 12/18/2022    6:12 PM 06/29/2021    1:39 PM  CMP  Glucose 70 - 99 mg/dL  128    BUN 6 - 20 mg/dL  8    Creatinine 0.44 - 1.00 mg/dL 0.65  0.72    Sodium 135 - 145 mmol/L   137    Potassium 3.5 - 5.1 mmol/L  3.3  4.1   Chloride 98 - 111 mmol/L  103    CO2 22 - 32 mmol/L  22    Calcium 8.9 - 10.3 mg/dL  9.3    Total Protein 6.5 - 8.1 g/dL  7.7    Total Bilirubin 0.3 - 1.2 mg/dL  1.0    Alkaline Phos 38 - 126 U/L  92    AST 15 - 41 U/L  21    ALT 0 - 44 U/L  27      Imaging studies: No new pertinent imaging studies   Assessment/Plan: 37 y.o. female 1 Day Post-Op s/p robotic assisted laparoscopic cholecystectomy    - Okay to advance diet; reviewed dietary recommendations after cholecystectomy  - Discontinue IVF - I do not think she needs Abx - Monitor abdominal examination; on-going bowel function   - Pain control prn; antiemetics prn   - Mobilize - Further management per primary service; we will follow  - Discharge Planning; Okay for discharge from surgical perspective. I will be happy to do pain medication Rx and arrange follow up in 2-3 weeks.    All of the above findings and recommendations were discussed with the patient, and the medical  team, and all of patient's questions were answered to her  expressed satisfaction.  -- Edison Simon, PA-C [facility] 12/20/2022, 7:36 AM M-F: 7am - 4pm

## 2022-12-20 NOTE — Hospital Course (Addendum)
Taken from H&P.   Tricia Kerr is a 37 y.o. female with medical history significant of bipolar, anxiety who presents to the hospital with complaints of abdominal pain.  CT scan showed contracted gallbladder with gallstone.  No obstruction.  Lab study showed leukocytosis with reactive thrombocytosis, potassium 3.3.  Lipase 240.    Surgery was consulted and she was taken for laparoscopic cholecystectomy on 12/19/2022.  Tolerated the procedure well.  1/10: Hemodynamically stable.  Slight worsening of leukocytosis to 21.8 after the surgery, most likely reactive, thrombocytosis has been resolved.  Lipase normalized to 34.  Mild transaminitis with AST of 67 and ALT of 70 postsurgically.  Surgical pathology pending. Patient remained stable, no nausea or vomiting.  Pain well-controlled.  Tolerating diet. Seeing flatus with no bowel movement yet.  Surgery cleared her for discharge with a close outpatient follow-up.  She has been given oxycodone and ibuprofen to help with pain and will follow-up with general surgery for further recommendations.

## 2022-12-20 NOTE — Discharge Summary (Signed)
Physician Discharge Summary   Patient: Tricia Kerr MRN: [id number] DOB: [birth year]  Admit date:     12/19/2022  Discharge date: 12/20/22  Discharge Physician: Lorella Nimrod   PCP: Pcp, No   Recommendations at discharge:  Follow-up with general surgery Follow-up with primary care provider  Discharge Diagnoses: Principal Problem:   Gallstone pancreatitis Active Problems:   Tobacco abuse   Bipolar 1 disorder (HCC)   Hypokalemia   Reactive thrombocytosis   Obesity (BMI 30-39.9)   Gastroenteritis   Cholecystitis, acute   Hospital Course: Taken from H&P.   Tricia Kerr is a 37 y.o. female with medical history significant of bipolar, anxiety who presents to the hospital with complaints of abdominal pain.  CT scan showed contracted gallbladder with gallstone.  No obstruction.  Lab study showed leukocytosis with reactive thrombocytosis, potassium 3.3.  Lipase 240.    Surgery was consulted and she was taken for laparoscopic cholecystectomy on 12/19/2022.  Tolerated the procedure well.  1/10: Hemodynamically stable.  Slight worsening of leukocytosis to 21.8 after the surgery, most likely reactive, thrombocytosis has been resolved.  Lipase normalized to 34.  Mild transaminitis with AST of 67 and ALT of 70 postsurgically.  Surgical pathology pending. Patient remained stable, no nausea or vomiting.  Pain well-controlled.  Tolerating diet. Seeing flatus with no bowel movement yet.  Surgery cleared her for discharge with a close outpatient follow-up.  She has been given oxycodone and ibuprofen to help with pain and will follow-up with general surgery for further recommendations.   Pain control - [employer] Controlled Substance Reporting System database was reviewed. and patient was instructed, not to drive, operate heavy machinery, perform activities at heights, swimming or participation in water activities or provide baby-sitting services while on Pain, Sleep and Anxiety Medications;  until their outpatient Physician has advised to do so again. Also recommended to not to take more than prescribed Pain, Sleep and Anxiety Medications.  Consultants: General surgery Procedures performed: Laparoscopic cholecystectomy Disposition: Home Diet recommendation:  Discharge Diet Orders (From admission, onward)     Start     Ordered   12/20/22 0000  Diet - low sodium heart healthy        12/20/22 1137           Regular diet DISCHARGE MEDICATION: Allergies as of 12/20/2022       Reactions   Nicotine Anaphylaxis   Nicotine patches         Medication List     STOP taking these medications    benztropine 0.5 MG tablet Commonly known as: COGENTIN   haloperidol 5 MG tablet Commonly known as: HALDOL   haloperidol decanoate 100 MG/ML injection Commonly known as: HALDOL DECANOATE       TAKE these medications    ibuprofen 600 MG tablet Commonly known as: ADVIL Take 1 tablet (600 mg total) by mouth every 6 (six) hours as needed.   metoprolol succinate 50 MG 24 hr tablet Commonly known as: TOPROL-XL Take 1 tablet (50 mg total) by mouth daily.   oxyCODONE 5 MG immediate release tablet Commonly known as: Oxy IR/ROXICODONE Take 1 tablet (5 mg total) by mouth every 6 (six) hours as needed for severe pain or breakthrough pain.   pantoprazole 40 MG tablet Commonly known as: PROTONIX Take 1 tablet (40 mg total) by mouth daily. Start taking on: December 21, 2022   Vraylar 4.5 MG Caps Generic drug: Cariprazine HCl Take 4.5 mg by mouth.        Follow-up  Information     Donovan Kail, PA-C. Schedule an appointment as soon as possible for a visit in 3 week(s).   Specialty: Physician Assistant Why: s/p lap chole Contact information: [address] [place] [address] [phone]                Discharge Exam: Ceasar Mons Weights   12/18/22 1800 12/19/22 1517  Weight: 79 kg 79 kg   General.     In no acute distress. Pulmonary.  Lungs  clear bilaterally, normal respiratory effort. CV.  Regular rate and rhythm, no JVD, rub or murmur. Abdomen.  Soft, nontender, nondistended, BS positive. CNS.  Alert and oriented .  No focal neurologic deficit. Extremities.  No edema, no cyanosis, pulses intact and symmetrical. Psychiatry.  Judgment and insight appears normal.   Condition at discharge: stable  The results of significant diagnostics from this hospitalization (including imaging, microbiology, ancillary and laboratory) are listed below for reference.   Imaging Studies: CT ABDOMEN PELVIS W CONTRAST  Result Date: 12/19/2022 CLINICAL DATA:  37 year old female with history of pancreatitis. Possible gallstones. Vomiting. Abdominal pain. EXAM: CT ABDOMEN AND PELVIS WITH CONTRAST TECHNIQUE: Multidetector CT imaging of the abdomen and pelvis was performed using the standard protocol following bolus administration of intravenous contrast. RADIATION DOSE REDUCTION: This exam was performed according to the departmental dose-optimization program which includes automated exposure control, adjustment of the mA and/or kV according to patient size and/or use of iterative reconstruction technique. CONTRAST:  OMNIPAQUE IOHEXOL 300 MG/ML  SOLN COMPARISON:  CT of the abdomen and pelvis 06/01/2021. FINDINGS: Lower chest: Unremarkable. Hepatobiliary: No suspicious cystic or solid hepatic lesions. No intra or extrahepatic biliary ductal dilatation. Gallbladder appears nearly completely contracted around at least to indwelling noncalcified gallstones which measure up to 1.8 cm in diameter. No definite pericholecystic fluid or surrounding inflammatory changes. Pancreas: No pancreatic mass. No pancreatic ductal dilatation. No pancreatic or peripancreatic fluid collections or inflammatory changes. Pancreatic parenchyma enhances normally. Spleen: Unremarkable. Adrenals/Urinary Tract: Bilateral kidneys and bilateral adrenal glands are normal in appearance. No  hydroureteronephrosis. Urinary bladder is nearly completely decompressed, but otherwise unremarkable in appearance. Stomach/Bowel: The appearance of the stomach is normal. There is no pathologic dilatation of small bowel or colon. The appendix is not confidently identified and may be surgically absent. Regardless, there are no inflammatory changes noted adjacent to the cecum to suggest the presence of an acute appendicitis at this time. Vascular/Lymphatic: No significant atherosclerotic disease, aneurysm or dissection noted in the abdominal or pelvic vasculature. No lymphadenopathy noted in the abdomen or pelvis. Reproductive: Uterus and ovaries are unremarkable in appearance. Other: No significant volume of ascites.  No pneumoperitoneum. Musculoskeletal: There are no aggressive appearing lytic or blastic lesions noted in the visualized portions of the skeleton. IMPRESSION: 1. Pancreas is morphologically normal in appearance without peripancreatic inflammatory changes or altered perfusion. 2. At least 2 large noncalcified gallstones are noted within the gallbladder. Gallbladder is completely contracted around the stones. No pericholecystic fluid or surrounding inflammatory changes to indicate an acute cholecystitis at this time. Similar findings were present on prior noncontrast CT 06/01/2021. 3. No other acute findings are noted elsewhere in the abdomen or pelvis to account for the patient's symptoms. Electronically Signed   By: Trudie Reed M.D.   On: 12/19/2022 05:08    Microbiology: Results for orders placed or performed during the hospital encounter of 12/19/22  Resp panel by RT-PCR (RSV, Flu A&B, Covid) Anterior Nasal Swab     Status:  None   Collection Time: 12/19/22  8:51 AM   Specimen: Anterior Nasal Swab  Result Value Ref Range Status   SARS Coronavirus 2 by RT PCR NEGATIVE NEGATIVE Final    Comment: (NOTE) SARS-CoV-2 target nucleic acids are NOT DETECTED.  The SARS-CoV-2 RNA is generally  detectable in upper respiratory specimens during the acute phase of infection. The lowest concentration of SARS-CoV-2 viral copies this assay can detect is 138 copies/mL. A negative result does not preclude SARS-Cov-2 infection and should not be used as the sole basis for treatment or other patient management decisions. A negative result may occur with  improper specimen collection/handling, submission of specimen other than nasopharyngeal swab, presence of viral mutation(s) within the areas targeted by this assay, and inadequate number of viral copies(<138 copies/mL). A negative result must be combined with clinical observations, patient history, and epidemiological information. The expected result is Negative.  Fact Sheet for Patients:  EntrepreneurPulse.com.au  Fact Sheet for Healthcare Providers:  IncredibleEmployment.be  This test is no t yet approved or cleared by the [place] FDA and  has been authorized for detection and/or diagnosis of SARS-CoV-2 by FDA under an Emergency Use Authorization (EUA). This EUA will remain  in effect (meaning this test can be used) for the duration of the COVID-19 declaration under Section 564(b)(1) of the Act, 21 U.S.C.section 360bbb-3(b)(1), unless the authorization is terminated  or revoked sooner.       Influenza A by PCR NEGATIVE NEGATIVE Final   Influenza B by PCR NEGATIVE NEGATIVE Final    Comment: (NOTE) The Xpert Xpress SARS-CoV-2/FLU/RSV plus assay is intended as an aid in the diagnosis of influenza from Nasopharyngeal swab specimens and should not be used as a sole basis for treatment. Nasal washings and aspirates are unacceptable for Xpert Xpress SARS-CoV-2/FLU/RSV testing.  Fact Sheet for Patients: EntrepreneurPulse.com.au  Fact Sheet for Healthcare Providers: IncredibleEmployment.be  This test is not yet approved or cleared by the [place] FDA  and has been authorized for detection and/or diagnosis of SARS-CoV-2 by FDA under an Emergency Use Authorization (EUA). This EUA will remain in effect (meaning this test can be used) for the duration of the COVID-19 declaration under Section 564(b)(1) of the Act, 21 U.S.C. section 360bbb-3(b)(1), unless the authorization is terminated or revoked.     Resp Syncytial Virus by PCR NEGATIVE NEGATIVE Final    Comment: (NOTE) Fact Sheet for Patients: EntrepreneurPulse.com.au  Fact Sheet for Healthcare Providers: IncredibleEmployment.be  This test is not yet approved or cleared by the [place] FDA and has been authorized for detection and/or diagnosis of SARS-CoV-2 by FDA under an Emergency Use Authorization (EUA). This EUA will remain in effect (meaning this test can be used) for the duration of the COVID-19 declaration under Section 564(b)(1) of the Act, 21 U.S.C. section 360bbb-3(b)(1), unless the authorization is terminated or revoked.  Performed at [facility], [address] [place], [place] [address]     Labs: CBC: Recent Labs  Lab 12/18/22 1812 12/19/22 0848 12/20/22 0602  WBC 18.0* 17.4* 21.8*  HGB 15.2* 13.3 13.6  HCT 44.9 40.2 40.5  MCV 84.6 85.4 85.1  PLT 462* 374 694   Basic Metabolic Panel: Recent Labs  Lab 12/18/22 1812 12/19/22 0848 12/20/22 0602  NA 137  --  136  K 3.3*  --  4.4  CL 103  --  107  CO2 22  --  21*  GLUCOSE 128*  --  130*  BUN 8  --  8  CREATININE 0.72 0.65 0.61  CALCIUM 9.3  --  9.1  MG  --   --  1.9  PHOS  --   --  3.5   Liver Function Tests: Recent Labs  Lab 12/18/22 1812 12/20/22 0602  AST 21 67*  ALT 27 70*  ALKPHOS 92 92  BILITOT 1.0 0.8  PROT 7.7 6.5  ALBUMIN 4.2 3.4*   CBG: No results for input(s): "GLUCAP" in the last 168 hours.  Discharge time spent: greater than 30 minutes.  This record has been created using Systems analyst. Errors have  been sought and corrected,but may not always be located. Such creation errors do not reflect on the standard of care.   Signed: Lorella Nimrod, MD [facility] 12/20/2022

## 2022-12-20 NOTE — Progress Notes (Signed)
Patient discharged home with family.  Discharge instructions, when to follow up, and prescriptions reviewed with patient.  Patient verbalized understanding. Patient will be escorted out by auxiliary.   

## 2022-12-21 LAB — SURGICAL PATHOLOGY

## 2022-12-21 NOTE — Anesthesia Postprocedure Evaluation (Signed)
Anesthesia Post Note  Patient: Breigh Annett  Procedure(s) Performed: XI ROBOTIC ASSISTED LAPAROSCOPIC CHOLECYSTECTOMY (Abdomen) INDOCYANINE GREEN FLUORESCENCE IMAGING (ICG) (Abdomen)  Patient location during evaluation: PACU Anesthesia Type: General Level of consciousness: awake and alert Pain management: pain level controlled Vital Signs Assessment: post-procedure vital signs reviewed and stable Respiratory status: spontaneous breathing, nonlabored ventilation, respiratory function stable and patient connected to nasal cannula oxygen Cardiovascular status: blood pressure returned to baseline and stable Postop Assessment: no apparent nausea or vomiting Anesthetic complications: no   No notable events documented.   Last Vitals:  Vitals:   12/20/22 0321 12/20/22 0818  BP: 108/70 117/74  Pulse: 72 87  Resp: 18 18  Temp: 36.6 C 36.6 C  SpO2: 93% 98%    Last Pain:  Vitals:   12/20/22 0930  TempSrc:   PainSc: 4                  Martha Clan

## 2023-01-04 ENCOUNTER — Ambulatory Visit (INDEPENDENT_AMBULATORY_CARE_PROVIDER_SITE_OTHER): Payer: Self-pay | Admitting: Physician Assistant

## 2023-01-04 ENCOUNTER — Encounter: Payer: Self-pay | Admitting: Physician Assistant

## 2023-01-04 VITALS — BP 136/88 | HR 94 | Temp 98.7°F | Ht 59.0 in | Wt 174.0 lb

## 2023-01-04 DIAGNOSIS — K810 Acute cholecystitis: Secondary | ICD-10-CM

## 2023-01-04 DIAGNOSIS — Z09 Encounter for follow-up examination after completed treatment for conditions other than malignant neoplasm: Secondary | ICD-10-CM

## 2023-01-04 DIAGNOSIS — K851 Biliary acute pancreatitis without necrosis or infection: Secondary | ICD-10-CM

## 2023-01-04 NOTE — Progress Notes (Signed)
[facility] POST-OP OFFICE VISIT  01/04/2023  HPI: Tricia Kerr is a 37 y.o. female 16 days s/p robotic assisted laparoscopic cholecystectomy  for acute cholecystitis with Dr Dahlia Byes.   She is doing great Few days of soreness but now pain free; not needing medications No fever, chills, nausea, emesis Tolerating PO; no diarrhea No issues with incisions No other complaints   Vital signs: BP 136/88   Pulse 94   Temp 98.7 F (37.1 C) (Oral)   Ht 4\' 11"  (1.499 m)   Wt 174 lb (78.9 kg)   LMP 12/02/2022 Comment: negative pregnancy test  SpO2 97%   BMI 35.14 kg/m    Physical Exam: Constitutional: Well appearing female, NAD Abdomen: Soft, non-tender, non-distended, no rebound/guarding Skin: Laparoscopic incisions are healing well, no erythema or drainage   Assessment/Plan: This is a 37 y.o. female 16 days s/p robotic assisted laparoscopic cholecystectomy  for acute cholecystitis with Dr Dahlia Byes.    - Pain control prn  - Reviewed wound care recommendation  - Reviewed lifting restrictions; 4 weeks total  - Reviewed surgical pathology; Acute and chronic cholecystitis, cholelithiasis  - She can follow up on as needed basis; She understands to call with questions/concerns  -- Edison Simon, PA-C [facility] 01/04/2023, 2:00 PM M-F: 7am - 4pm

## 2023-01-04 NOTE — Patient Instructions (Signed)

## 2023-12-23 ENCOUNTER — Encounter: Payer: Self-pay | Admitting: Emergency Medicine

## 2023-12-23 ENCOUNTER — Ambulatory Visit
Admission: EM | Admit: 2023-12-23 | Discharge: 2023-12-23 | Disposition: A | Discharge status: Home / Self Care | Payer: Self-pay | Attending: Family Medicine | Admitting: Family Medicine

## 2023-12-23 DIAGNOSIS — H1031 Unspecified acute conjunctivitis, right eye: Secondary | ICD-10-CM

## 2023-12-23 MED ORDER — MOXIFLOXACIN HCL 0.5 % OP SOLN: 1.0000 [drp] | Freq: Three times a day (TID) | OPHTHALMIC | 0 refills | Status: AC

## 2023-12-23 NOTE — ED Provider Notes (Signed)
 [facility]    CSN: [id number] Arrival date & time: 12/23/23  1320      History   Chief Complaint Chief Complaint  Patient presents with   Eye Problem    right    HPI Tricia Kerr is a 38 y.o. female.   HPI  38 year old female with past medical history significant for bipolar affective disorder, cannabis use disorder, and tobacco use presents for evaluation of 2 days worth of right eye redness, itching, and crusty discharge.  She also endorses some mild blurry vision.  She denies any upper respiratory symptoms or known sick contacts.  Past Medical History:  Diagnosis Date   Anxiety    Bipolar disorder [facility])     Patient Active Problem List   Diagnosis Date Noted   Gallstone pancreatitis 12/19/2022   Hypokalemia 12/19/2022   Reactive thrombocytosis 12/19/2022   Obesity (BMI 30-39.9) 12/19/2022   Gastroenteritis 12/19/2022   Cholecystitis, acute 12/19/2022   Bipolar affective disorder, current episode manic with psychotic symptoms (HCC) 06/20/2021   Bipolar affective disorder, current episode mixed, without psychotic features (HCC) 06/19/2021   Bipolar 1 disorder (HCC) 06/02/2021   Bipolar I disorder, single manic episode, severe, with psychosis (HCC) 06/01/2021   Intractable nausea and vomiting 03/20/2017   Tobacco abuse 08/23/2015   Cannabis use disorder, severe, dependence (HCC) 08/23/2015    Past Surgical History:  Procedure Laterality Date   APPENDECTOMY      OB History     Gravida  3   Para      Term      Preterm      AB      Living  3      SAB      IAB      Ectopic      Multiple      Live Births               Home Medications    Prior to Admission medications   Medication Sig Start Date End Date Taking? Authorizing Provider  Cariprazine  HCl (VRAYLAR ) 4.5 MG CAPS Take 4.5 mg by mouth.   Yes [provider]  moxifloxacin  (VIGAMOX ) 0.5 % ophthalmic solution Place 1 drop into the right eye 3 (three) times  daily for 7 days. 12/23/23 12/30/23 Yes Bernardino Ditch, NP  sertraline (ZOLOFT) 50 MG tablet Take 50 mg by mouth daily.   Yes [provider]  traZODone  (DESYREL ) 100 MG tablet Take 100 mg by mouth at bedtime.   Yes [provider]  ibuprofen  (ADVIL ) 600 MG tablet Take 1 tablet (600 mg total) by mouth every 6 (six) hours as needed. 12/20/22   Terryl Arthea SAUNDERS, PA-C    Family History History reviewed. No pertinent family history.  Social History Social History   Tobacco Use   Smoking status: Every Day    Current packs/day: 1.00    Types: Cigarettes   Smokeless tobacco: Never  Vaping Use   Vaping status: Never Used  Substance Use Topics   Alcohol use: Yes    Comment: Socially   Drug use: Not Currently    Types: Marijuana, Cocaine, Benzodiazepines     Allergies   Nicotine   Review of Systems Review of Systems  Constitutional:  Negative for fever.  Eyes:  Positive for discharge, redness, itching and visual disturbance. Negative for photophobia and pain.     Physical Exam Triage Vital Signs ED Triage Vitals  Encounter Vitals Group     BP  Systolic BP Percentile      Diastolic BP Percentile      Pulse      Resp      Temp      Temp src      SpO2      Weight      Height      Head Circumference      Peak Flow      Pain Score      Pain Loc      Pain Education      Exclude from Growth Chart    No data found.  Updated Vital Signs BP 112/71 (BP Location: Left Arm)   Pulse 72   Temp 98.4 F (36.9 C) (Oral)   Resp 14   Ht 4' 11 (1.499 m)   Wt 170 lb (77.1 kg)   LMP 12/02/2023 (Approximate)   SpO2 97%   BMI 34.34 kg/m   Visual Acuity Right Eye Distance: 20/50 uncorrected Left Eye Distance: 20/50 uncorrected Bilateral Distance: 20/40 uncorrected  Right Eye Near:   Left Eye Near:    Bilateral Near:     Physical Exam Vitals and nursing note reviewed.  Constitutional:      Appearance: Normal appearance. She is not ill-appearing.   HENT:     Head: Normocephalic and atraumatic.  Eyes:     Extraocular Movements: Extraocular movements intact.     Pupils: Pupils are equal, round, and reactive to light.     Comments: Bulbar labral conjunctiva of the right eye are erythematous and injected.  There is yellow-green mucopurulent discharge in the inner canthus of the right eye.  White crusting in the upper lashes.  Pupils equal round reactive and EOMs intact.  Normal red light reflex in the right eye.  Skin:    General: Skin is warm and dry.     Capillary Refill: Capillary refill takes less than 2 seconds.     Findings: No rash.  Neurological:     Mental Status: She is alert.      UC Treatments / Results  Labs (all labs ordered are listed, but only abnormal results are displayed) Labs Reviewed - No data to display  EKG   Radiology No results found.  Procedures Procedures (including critical care time)  Medications Ordered in UC Medications - No data to display  Initial Impression / Assessment and Plan / UC Course  I have reviewed the triage vital signs and the nursing notes.  Pertinent labs & imaging results that were available during my care of the patient were reviewed by me and considered in my medical decision making (see chart for details).   Patient is a nontoxic-appearing 38 old female presenting for evaluation of conjunctivitis symptoms in her right eye that started 2 days ago.  As you can see in image above, the right eye is red.  Further evaluation reveals erythema and injection of bulbar and labral conjunctiva.  There is yellow-green mucopurulent discharge in the inner canthus of the right eye.  Also some crusting in the upper lashes.  EOM is intact and pupils equal round and reactive.  Normal red light reflex in the right eye.  Patient's exam is consistent with conjunctivitis.  I will treat her with Vigamox  1 drop 3 times daily x 7 days.  Return precautions at home infection control precautions  reviewed.  Final Clinical Impressions(s) / UC Diagnoses   Final diagnoses:  Acute bacterial conjunctivitis of right eye     Discharge Instructions  Instill 1 drop of Vigamox  in each eye every 8 hours for the next 7 days for treatment of your conjunctivitis.  Avoid touching your eyes as much as possible.  Wipe down all surfaces, countertops, and doorknobs after the first and second 24 hours on eyedrops.  Wash her face with a clean wash rag to remove any drainage and use a different portion of the wash rag to clean each eye so as to not reinfect yourself.  Return for reevaluation for any new or worsening symptoms.      ED Prescriptions     Medication Sig Dispense Auth. Provider   moxifloxacin  (VIGAMOX ) 0.5 % ophthalmic solution Place 1 drop into the right eye 3 (three) times daily for 7 days. 3 mL Bernardino Ditch, NP      PDMP not reviewed this encounter.   Bernardino Ditch, NP 12/23/23 1431

## 2023-12-23 NOTE — ED Triage Notes (Signed)
 Patient c/o redness and drainage from her right eye for the past 1-2 days.  Patient denies fevers.

## 2023-12-23 NOTE — Discharge Instructions (Addendum)
 Instill 1 drop of Vigamox in each eye every 8 hours for the next 7 days for treatment of your conjunctivitis.  Avoid touching your eyes as much as possible.  Wipe down all surfaces, countertops, and doorknobs after the first and second 24 hours on eyedrops.  Wash her face with a clean wash rag to remove any drainage and use a different portion of the wash rag to clean each eye so as to not reinfect yourself.  Return for reevaluation for any new or worsening symptoms.

## 2024-09-05 ENCOUNTER — Emergency Department
Admission: EM | Admit: 2024-09-05 | Discharge: 2024-09-06 | Disposition: A | Discharge status: Home / Self Care | Payer: Self-pay | Attending: Emergency Medicine | Admitting: Emergency Medicine

## 2024-09-05 ENCOUNTER — Emergency Department: Payer: Self-pay

## 2024-09-05 ENCOUNTER — Encounter: Payer: Self-pay | Admitting: Emergency Medicine

## 2024-09-05 ENCOUNTER — Other Ambulatory Visit: Payer: Self-pay

## 2024-09-05 DIAGNOSIS — R112 Nausea with vomiting, unspecified: Secondary | ICD-10-CM | POA: Insufficient documentation

## 2024-09-05 DIAGNOSIS — R509 Fever, unspecified: Secondary | ICD-10-CM | POA: Insufficient documentation

## 2024-09-05 DIAGNOSIS — D72829 Elevated white blood cell count, unspecified: Secondary | ICD-10-CM | POA: Insufficient documentation

## 2024-09-05 DIAGNOSIS — R1010 Upper abdominal pain, unspecified: Secondary | ICD-10-CM | POA: Insufficient documentation

## 2024-09-05 LAB — CBC
HCT: 44.6 % (ref 36.0–46.0)
Hemoglobin: 15.3 g/dL — ABNORMAL HIGH (ref 12.0–15.0)
MCH: 28.2 pg (ref 26.0–34.0)
MCHC: 34.3 g/dL (ref 30.0–36.0)
MCV: 82.1 fL (ref 80.0–100.0)
Platelets: 469 K/uL — ABNORMAL HIGH (ref 150–400)
RBC: 5.43 MIL/uL — ABNORMAL HIGH (ref 3.87–5.11)
RDW: 13.8 % (ref 11.5–15.5)
WBC: 21 K/uL — ABNORMAL HIGH (ref 4.0–10.5)
nRBC: 0 % (ref 0.0–0.2)

## 2024-09-05 LAB — COMPREHENSIVE METABOLIC PANEL WITH GFR
ALT: 28 U/L (ref 0–44)
AST: 33 U/L (ref 15–41)
Albumin: 4.6 g/dL (ref 3.5–5.0)
Alkaline Phosphatase: 92 U/L (ref 38–126)
Anion gap: 14 (ref 5–15)
BUN: 12 mg/dL (ref 6–20)
CO2: 22 mmol/L (ref 22–32)
Calcium: 9.5 mg/dL (ref 8.9–10.3)
Chloride: 102 mmol/L (ref 98–111)
Creatinine, Ser: 0.76 mg/dL (ref 0.44–1.00)
GFR, Estimated: >60 mL/min (ref >60)
Glucose, Bld: 197 mg/dL — ABNORMAL HIGH (ref 70–99)
Potassium: 3.7 mmol/L (ref 3.5–5.1)
Sodium: 138 mmol/L (ref 135–145)
Total Bilirubin: 1.1 mg/dL (ref 0.0–1.2)
Total Protein: 8.8 g/dL — ABNORMAL HIGH (ref 6.5–8.1)

## 2024-09-05 LAB — URINALYSIS, ROUTINE W REFLEX MICROSCOPIC
Bilirubin Urine: NEGATIVE
Glucose, UA: 50 mg/dL — AB
Hgb urine dipstick: NEGATIVE
Ketones, ur: 5 mg/dL — AB
Leukocytes,Ua: NEGATIVE
Nitrite: NEGATIVE
Protein, ur: >=300 mg/dL — AB
Specific Gravity, Urine: 1.028 (ref 1.005–1.030)
pH: 7 (ref 5.0–8.0)

## 2024-09-05 LAB — POC URINE PREG, ED: Preg Test, Ur: NEGATIVE

## 2024-09-05 LAB — LIPASE, BLOOD: Lipase: 18 U/L (ref 11–51)

## 2024-09-05 LAB — RESP PANEL BY RT-PCR (RSV, FLU A&B, COVID)  RVPGX2
Influenza A by PCR: NEGATIVE
Influenza B by PCR: NEGATIVE
Resp Syncytial Virus by PCR: NEGATIVE
SARS Coronavirus 2 by RT PCR: NEGATIVE

## 2024-09-05 MED ORDER — KETOROLAC TROMETHAMINE 30 MG/ML IJ SOLN
30.0000 mg | Freq: Once | INTRAMUSCULAR | Status: AC
  Administered 2024-09-05: 30 mg via INTRAVENOUS
  Filled 2024-09-05: qty 1

## 2024-09-05 MED ORDER — IOHEXOL 300 MG/ML  SOLN
100.0000 mL | Freq: Once | INTRAMUSCULAR | Status: AC | PRN
  Administered 2024-09-05: 100 mL via INTRAVENOUS

## 2024-09-05 MED ORDER — SODIUM CHLORIDE 0.9 % IV BOLUS
1000.0000 mL | Freq: Once | INTRAVENOUS | Status: AC
  Administered 2024-09-05: 1000 mL via INTRAVENOUS

## 2024-09-05 MED ORDER — ONDANSETRON 4 MG PO TBDP: 4.0000 mg | ORAL_TABLET | Freq: Three times a day (TID) | ORAL | 0 refills | Status: AC | PRN

## 2024-09-05 MED ORDER — DICYCLOMINE HCL 10 MG PO CAPS
10.0000 mg | ORAL_CAPSULE | Freq: Once | ORAL | Status: AC
  Administered 2024-09-05: 10 mg via ORAL
  Filled 2024-09-05: qty 1

## 2024-09-05 MED ORDER — ACETAMINOPHEN 500 MG PO TABS
1000.0000 mg | ORAL_TABLET | Freq: Once | ORAL | Status: AC
  Administered 2024-09-05: 1000 mg via ORAL
  Filled 2024-09-05: qty 2

## 2024-09-05 MED ORDER — DICYCLOMINE HCL 10 MG PO CAPS: 10.0000 mg | ORAL_CAPSULE | Freq: Three times a day (TID) | ORAL | 0 refills | Status: AC

## 2024-09-05 MED ORDER — ONDANSETRON HCL 4 MG/2ML IJ SOLN
4.0000 mg | Freq: Once | INTRAMUSCULAR | Status: AC
  Administered 2024-09-05: 4 mg via INTRAVENOUS
  Filled 2024-09-05: qty 2

## 2024-09-05 NOTE — ED Provider Notes (Signed)
 [facility] Provider Note    Event Date/Time   First MD Initiated Contact with Patient 09/05/24 2128     (approximate)   History   Emesis   HPI  Tricia Kerr is a 38 y.o. female with history of cholecystectomy, appendectomy, bipolar disorder, cannabis use disorder and as listed in EMR presents to the emergency department for treatment and evaluation of vomiting and abdominal pain for the past 4 days.  Vomiting started first.  Subjective fever yesterday.  No known sick contacts.     Physical Exam    Vitals:   09/05/24 1820 09/05/24 2247  BP: (!) 195/93 (!) 160/69  Pulse: 60 (!) 50  Resp: 17 16  Temp: 98.3 F (36.8 C) 100.2 F (37.9 C)  SpO2: 100% 100%    General: Awake, no distress.  CV:  Good peripheral perfusion.  Resp:  Normal effort.  Abd:  No distention.  Diffuse upper abdominal tenderness. Other:     ED Results / Procedures / Treatments   Labs (all labs ordered are listed, but only abnormal results are displayed)  Labs Reviewed  COMPREHENSIVE METABOLIC PANEL WITH GFR - Abnormal; Notable for the following components:      Result Value   Glucose, Bld 197 (*)    Total Protein 8.8 (*)    All other components within normal limits  CBC - Abnormal; Notable for the following components:   WBC 21.0 (*)    RBC 5.43 (*)    Hemoglobin 15.3 (*)    Platelets 469 (*)    All other components within normal limits  URINALYSIS, ROUTINE W REFLEX MICROSCOPIC - Abnormal; Notable for the following components:   Color, Urine YELLOW (*)    APPearance CLEAR (*)    Glucose, UA 50 (*)    Ketones, ur 5 (*)    Protein, ur >=300 (*)    Bacteria, UA RARE (*)    All other components within normal limits  RESP PANEL BY RT-PCR (RSV, FLU A&B, COVID)  RVPGX2  LIPASE, BLOOD  POC URINE PREG, ED     EKG  Not indicated   RADIOLOGY  Image and radiology report reviewed and interpreted by me. Radiology report consistent with the same.  CT abdomen  pelvis with contrast is negative for acute concerns.  PROCEDURES:  Critical Care performed: No  Procedures   MEDICATIONS ORDERED IN ED:  Medications  acetaminophen  (TYLENOL ) tablet 1,000 mg (has no administration in time range)  ketorolac  (TORADOL ) 30 MG/ML injection 30 mg (has no administration in time range)  dicyclomine  (BENTYL ) capsule 10 mg (has no administration in time range)  sodium chloride  0.9 % bolus 1,000 mL (1,000 mLs Intravenous New Bag/Given 09/05/24 2225)  ondansetron  (ZOFRAN ) injection 4 mg (4 mg Intravenous Given 09/05/24 2227)  iohexol  (OMNIPAQUE ) 300 MG/ML solution 100 mL (100 mLs Intravenous Contrast Given 09/05/24 2231)     IMPRESSION / MDM / ASSESSMENT AND PLAN / ED COURSE   I have reviewed the triage note and vital signs. Vital signs indicate mild bradycardia with a rate of 50   Differential diagnosis includes, but is not limited to, viral syndrome, COVID, influenza, choledocholithiasis, intra-abdominal infection, bowel obstruction  Patient's presentation is most consistent with acute presentation with potential threat to life or bodily function.   Clinical Course as of 09/05/24 2349  Fri Sep 05, 2024  2306 CT abdomen pelvis is negative for acute findings.  Hepatic steatosis is noted.  Results reviewed with the patient.  She states  that she is feeling better but still having some crampy abdominal pain. Toradol  ordered. Awaiting results of respiratory panel. [CT]  2340 Respiratory panel is negative.  No further vomiting. Continues to have crampy upper abdominal pain. She feels that she will be able to tolerate fluids. Bentyl  ordered. Plan for discharge home unless vomiting returns after fluids and medications.  Patient advised to return to the ER if she continues to have vomiting for more than 24 additional hours. She is agreeable to the plan. [CT]    Clinical Course User Index [CT] Rafaela Dinius B, FNP     FINAL CLINICAL IMPRESSION(S) / ED DIAGNOSES    Final diagnoses:  Nausea and vomiting, unspecified vomiting type  Pain of upper abdomen     Rx / DC Orders   ED Discharge Orders          Ordered    dicyclomine  (BENTYL ) 10 MG capsule  3 times daily before meals & bedtime        09/05/24 2343    ondansetron  (ZOFRAN -ODT) 4 MG disintegrating tablet  Every 8 hours PRN        09/05/24 2343             Note:  This document was prepared using Dragon voice recognition software and may include unintentional dictation errors.   Herlinda Kirk NOVAK, FNP 09/05/24 2349    Bradler, Evan K, MD 09/06/24 [phone]

## 2024-09-05 NOTE — Discharge Instructions (Addendum)
 Please return to the emergency department if vomiting continues for more than 24 hours or sooner if your pain and nausea gets worse.  Take medications as prescribed.  The ondansetron  should help with nausea and the dicyclomine  should help with the crampy abdominal pain.

## 2024-09-05 NOTE — ED Triage Notes (Signed)
 Patient to ED via POV for vomiting/ abd pain. Ongoing x4 days.
